# Patient Record
Sex: Male | Born: 1935 | Race: White | Hispanic: No | Marital: Married | Smoking: Current some day smoker
Health system: Southern US, Community
[De-identification: ages and names within clinical notes are randomized; demographics above are authoritative.]

## PROBLEM LIST (undated history)

## (undated) DIAGNOSIS — I4891 Unspecified atrial fibrillation: Secondary | ICD-10-CM

## (undated) DIAGNOSIS — J189 Pneumonia, unspecified organism: Secondary | ICD-10-CM

## (undated) DIAGNOSIS — K219 Gastro-esophageal reflux disease without esophagitis: Secondary | ICD-10-CM

## (undated) DIAGNOSIS — F419 Anxiety disorder, unspecified: Secondary | ICD-10-CM

## (undated) DIAGNOSIS — D62 Acute posthemorrhagic anemia: Secondary | ICD-10-CM

## (undated) DIAGNOSIS — J42 Unspecified chronic bronchitis: Secondary | ICD-10-CM

## (undated) DIAGNOSIS — R011 Cardiac murmur, unspecified: Secondary | ICD-10-CM

## (undated) DIAGNOSIS — I219 Acute myocardial infarction, unspecified: Secondary | ICD-10-CM

## (undated) DIAGNOSIS — I10 Essential (primary) hypertension: Secondary | ICD-10-CM

## (undated) DIAGNOSIS — I2510 Atherosclerotic heart disease of native coronary artery without angina pectoris: Secondary | ICD-10-CM

## (undated) DIAGNOSIS — N400 Enlarged prostate without lower urinary tract symptoms: Secondary | ICD-10-CM

## (undated) DIAGNOSIS — E7800 Pure hypercholesterolemia, unspecified: Secondary | ICD-10-CM

## (undated) DIAGNOSIS — N289 Disorder of kidney and ureter, unspecified: Secondary | ICD-10-CM

## (undated) DIAGNOSIS — K259 Gastric ulcer, unspecified as acute or chronic, without hemorrhage or perforation: Secondary | ICD-10-CM

## (undated) HISTORY — PX: HAND RECONSTRUCTION: SHX1730

## (undated) HISTORY — PX: CATARACT EXTRACTION W/ INTRAOCULAR LENS  IMPLANT, BILATERAL: SHX1307

## (undated) HISTORY — DX: Benign prostatic hyperplasia without lower urinary tract symptoms: N40.0

## (undated) HISTORY — DX: Disorder of kidney and ureter, unspecified: N28.9

## (undated) HISTORY — DX: Acute posthemorrhagic anemia: D62

## (undated) HISTORY — DX: Atherosclerotic heart disease of native coronary artery without angina pectoris: I25.10

## (undated) HISTORY — DX: Essential (primary) hypertension: I10

## (undated) HISTORY — DX: Pure hypercholesterolemia, unspecified: E78.00

## (undated) HISTORY — PX: INGUINAL HERNIA REPAIR: SUR1180

## (undated) HISTORY — DX: Gastro-esophageal reflux disease without esophagitis: K21.9

## (undated) HISTORY — DX: Gastric ulcer, unspecified as acute or chronic, without hemorrhage or perforation: K25.9

---

## 2013-08-11 DIAGNOSIS — M999 Biomechanical lesion, unspecified: Secondary | ICD-10-CM | POA: Diagnosis not present

## 2013-08-11 DIAGNOSIS — S239XXA Sprain of unspecified parts of thorax, initial encounter: Secondary | ICD-10-CM | POA: Diagnosis not present

## 2013-08-20 DIAGNOSIS — M999 Biomechanical lesion, unspecified: Secondary | ICD-10-CM | POA: Diagnosis not present

## 2013-08-20 DIAGNOSIS — S239XXA Sprain of unspecified parts of thorax, initial encounter: Secondary | ICD-10-CM | POA: Diagnosis not present

## 2013-09-07 DIAGNOSIS — K219 Gastro-esophageal reflux disease without esophagitis: Secondary | ICD-10-CM | POA: Diagnosis not present

## 2013-09-07 DIAGNOSIS — K140 Glossitis: Secondary | ICD-10-CM | POA: Diagnosis not present

## 2013-10-12 DIAGNOSIS — M999 Biomechanical lesion, unspecified: Secondary | ICD-10-CM | POA: Diagnosis not present

## 2013-10-12 DIAGNOSIS — S239XXA Sprain of unspecified parts of thorax, initial encounter: Secondary | ICD-10-CM | POA: Diagnosis not present

## 2013-10-19 DIAGNOSIS — S239XXA Sprain of unspecified parts of thorax, initial encounter: Secondary | ICD-10-CM | POA: Diagnosis not present

## 2013-10-19 DIAGNOSIS — M999 Biomechanical lesion, unspecified: Secondary | ICD-10-CM | POA: Diagnosis not present

## 2013-11-23 DIAGNOSIS — K140 Glossitis: Secondary | ICD-10-CM | POA: Diagnosis not present

## 2013-11-23 DIAGNOSIS — J0100 Acute maxillary sinusitis, unspecified: Secondary | ICD-10-CM | POA: Diagnosis not present

## 2013-11-23 DIAGNOSIS — F411 Generalized anxiety disorder: Secondary | ICD-10-CM | POA: Diagnosis not present

## 2013-11-23 DIAGNOSIS — K219 Gastro-esophageal reflux disease without esophagitis: Secondary | ICD-10-CM | POA: Diagnosis not present

## 2014-03-04 DIAGNOSIS — K140 Glossitis: Secondary | ICD-10-CM | POA: Diagnosis not present

## 2014-03-04 DIAGNOSIS — F411 Generalized anxiety disorder: Secondary | ICD-10-CM | POA: Diagnosis not present

## 2014-03-04 DIAGNOSIS — E785 Hyperlipidemia, unspecified: Secondary | ICD-10-CM | POA: Diagnosis not present

## 2014-03-04 DIAGNOSIS — I10 Essential (primary) hypertension: Secondary | ICD-10-CM | POA: Diagnosis not present

## 2014-03-04 DIAGNOSIS — K219 Gastro-esophageal reflux disease without esophagitis: Secondary | ICD-10-CM | POA: Diagnosis not present

## 2014-04-06 DIAGNOSIS — K140 Glossitis: Secondary | ICD-10-CM | POA: Diagnosis not present

## 2014-04-19 DIAGNOSIS — K219 Gastro-esophageal reflux disease without esophagitis: Secondary | ICD-10-CM | POA: Diagnosis not present

## 2014-04-19 DIAGNOSIS — G500 Trigeminal neuralgia: Secondary | ICD-10-CM | POA: Diagnosis not present

## 2014-04-19 DIAGNOSIS — J018 Other acute sinusitis: Secondary | ICD-10-CM | POA: Diagnosis not present

## 2014-07-29 DIAGNOSIS — J0100 Acute maxillary sinusitis, unspecified: Secondary | ICD-10-CM | POA: Diagnosis not present

## 2014-07-29 DIAGNOSIS — B370 Candidal stomatitis: Secondary | ICD-10-CM | POA: Diagnosis not present

## 2014-12-07 DIAGNOSIS — S90561A Insect bite (nonvenomous), right ankle, initial encounter: Secondary | ICD-10-CM | POA: Diagnosis not present

## 2014-12-07 DIAGNOSIS — L570 Actinic keratosis: Secondary | ICD-10-CM | POA: Diagnosis not present

## 2015-01-10 DIAGNOSIS — L820 Inflamed seborrheic keratosis: Secondary | ICD-10-CM | POA: Diagnosis not present

## 2015-01-10 DIAGNOSIS — L570 Actinic keratosis: Secondary | ICD-10-CM | POA: Diagnosis not present

## 2015-01-10 DIAGNOSIS — L821 Other seborrheic keratosis: Secondary | ICD-10-CM | POA: Diagnosis not present

## 2015-01-10 DIAGNOSIS — S80862A Insect bite (nonvenomous), left lower leg, initial encounter: Secondary | ICD-10-CM | POA: Diagnosis not present

## 2015-02-01 DIAGNOSIS — Z961 Presence of intraocular lens: Secondary | ICD-10-CM | POA: Diagnosis not present

## 2015-02-01 DIAGNOSIS — H04123 Dry eye syndrome of bilateral lacrimal glands: Secondary | ICD-10-CM | POA: Diagnosis not present

## 2015-02-01 DIAGNOSIS — H3530 Unspecified macular degeneration: Secondary | ICD-10-CM | POA: Diagnosis not present

## 2015-02-01 DIAGNOSIS — H26493 Other secondary cataract, bilateral: Secondary | ICD-10-CM | POA: Diagnosis not present

## 2015-02-02 DIAGNOSIS — K644 Residual hemorrhoidal skin tags: Secondary | ICD-10-CM | POA: Diagnosis not present

## 2015-02-22 DIAGNOSIS — H3531 Nonexudative age-related macular degeneration: Secondary | ICD-10-CM | POA: Diagnosis not present

## 2015-02-22 DIAGNOSIS — H26491 Other secondary cataract, right eye: Secondary | ICD-10-CM | POA: Diagnosis not present

## 2015-02-22 DIAGNOSIS — Z961 Presence of intraocular lens: Secondary | ICD-10-CM | POA: Diagnosis not present

## 2015-04-04 DIAGNOSIS — Z961 Presence of intraocular lens: Secondary | ICD-10-CM | POA: Diagnosis not present

## 2015-04-04 DIAGNOSIS — H3531 Nonexudative age-related macular degeneration: Secondary | ICD-10-CM | POA: Diagnosis not present

## 2015-04-04 DIAGNOSIS — H26492 Other secondary cataract, left eye: Secondary | ICD-10-CM | POA: Diagnosis not present

## 2015-04-12 DIAGNOSIS — L570 Actinic keratosis: Secondary | ICD-10-CM | POA: Diagnosis not present

## 2015-04-12 DIAGNOSIS — L281 Prurigo nodularis: Secondary | ICD-10-CM | POA: Diagnosis not present

## 2015-04-23 DIAGNOSIS — I4891 Unspecified atrial fibrillation: Secondary | ICD-10-CM

## 2015-04-23 DIAGNOSIS — I219 Acute myocardial infarction, unspecified: Secondary | ICD-10-CM

## 2015-04-23 HISTORY — DX: Acute myocardial infarction, unspecified: I21.9

## 2015-04-23 HISTORY — PX: CORONARY ARTERY BYPASS GRAFT: SHX141

## 2015-04-23 HISTORY — DX: Unspecified atrial fibrillation: I48.91

## 2015-04-26 DIAGNOSIS — H26492 Other secondary cataract, left eye: Secondary | ICD-10-CM | POA: Diagnosis not present

## 2015-05-04 DIAGNOSIS — Z951 Presence of aortocoronary bypass graft: Secondary | ICD-10-CM | POA: Diagnosis not present

## 2015-05-04 DIAGNOSIS — T508X5A Adverse effect of diagnostic agents, initial encounter: Secondary | ICD-10-CM | POA: Diagnosis not present

## 2015-05-04 DIAGNOSIS — I2109 ST elevation (STEMI) myocardial infarction involving other coronary artery of anterior wall: Secondary | ICD-10-CM | POA: Diagnosis not present

## 2015-05-04 DIAGNOSIS — J9811 Atelectasis: Secondary | ICD-10-CM | POA: Diagnosis not present

## 2015-05-04 DIAGNOSIS — I25110 Atherosclerotic heart disease of native coronary artery with unstable angina pectoris: Secondary | ICD-10-CM | POA: Diagnosis not present

## 2015-05-04 DIAGNOSIS — I2101 ST elevation (STEMI) myocardial infarction involving left main coronary artery: Secondary | ICD-10-CM | POA: Diagnosis not present

## 2015-05-04 DIAGNOSIS — E876 Hypokalemia: Secondary | ICD-10-CM | POA: Diagnosis not present

## 2015-05-04 DIAGNOSIS — I4891 Unspecified atrial fibrillation: Secondary | ICD-10-CM | POA: Diagnosis not present

## 2015-05-04 DIAGNOSIS — F17210 Nicotine dependence, cigarettes, uncomplicated: Secondary | ICD-10-CM | POA: Diagnosis not present

## 2015-05-04 DIAGNOSIS — R279 Unspecified lack of coordination: Secondary | ICD-10-CM | POA: Diagnosis not present

## 2015-05-04 DIAGNOSIS — G4700 Insomnia, unspecified: Secondary | ICD-10-CM | POA: Diagnosis not present

## 2015-05-04 DIAGNOSIS — Z452 Encounter for adjustment and management of vascular access device: Secondary | ICD-10-CM | POA: Diagnosis not present

## 2015-05-04 DIAGNOSIS — I213 ST elevation (STEMI) myocardial infarction of unspecified site: Secondary | ICD-10-CM | POA: Diagnosis not present

## 2015-05-04 DIAGNOSIS — R0602 Shortness of breath: Secondary | ICD-10-CM | POA: Diagnosis not present

## 2015-05-04 DIAGNOSIS — R570 Cardiogenic shock: Secondary | ICD-10-CM | POA: Diagnosis not present

## 2015-05-04 DIAGNOSIS — R531 Weakness: Secondary | ICD-10-CM | POA: Diagnosis not present

## 2015-05-04 DIAGNOSIS — Q245 Malformation of coronary vessels: Secondary | ICD-10-CM | POA: Diagnosis not present

## 2015-05-04 DIAGNOSIS — I2510 Atherosclerotic heart disease of native coronary artery without angina pectoris: Secondary | ICD-10-CM | POA: Diagnosis not present

## 2015-05-04 DIAGNOSIS — E785 Hyperlipidemia, unspecified: Secondary | ICD-10-CM | POA: Diagnosis present

## 2015-05-04 DIAGNOSIS — Z48812 Encounter for surgical aftercare following surgery on the circulatory system: Secondary | ICD-10-CM | POA: Diagnosis not present

## 2015-05-04 DIAGNOSIS — Z4889 Encounter for other specified surgical aftercare: Secondary | ICD-10-CM | POA: Diagnosis not present

## 2015-05-04 DIAGNOSIS — N179 Acute kidney failure, unspecified: Secondary | ICD-10-CM | POA: Diagnosis not present

## 2015-05-04 DIAGNOSIS — J441 Chronic obstructive pulmonary disease with (acute) exacerbation: Secondary | ICD-10-CM | POA: Diagnosis present

## 2015-05-04 DIAGNOSIS — I10 Essential (primary) hypertension: Secondary | ICD-10-CM | POA: Diagnosis not present

## 2015-05-04 DIAGNOSIS — R918 Other nonspecific abnormal finding of lung field: Secondary | ICD-10-CM | POA: Diagnosis not present

## 2015-05-04 DIAGNOSIS — I9789 Other postprocedural complications and disorders of the circulatory system, not elsewhere classified: Secondary | ICD-10-CM | POA: Diagnosis not present

## 2015-05-04 DIAGNOSIS — E872 Acidosis: Secondary | ICD-10-CM | POA: Diagnosis not present

## 2015-05-04 DIAGNOSIS — M6281 Muscle weakness (generalized): Secondary | ICD-10-CM | POA: Diagnosis not present

## 2015-05-04 DIAGNOSIS — J810 Acute pulmonary edema: Secondary | ICD-10-CM | POA: Diagnosis not present

## 2015-05-04 DIAGNOSIS — D62 Acute posthemorrhagic anemia: Secondary | ICD-10-CM | POA: Diagnosis not present

## 2015-05-04 DIAGNOSIS — E8770 Fluid overload, unspecified: Secondary | ICD-10-CM | POA: Diagnosis not present

## 2015-05-04 DIAGNOSIS — E7800 Pure hypercholesterolemia, unspecified: Secondary | ICD-10-CM | POA: Diagnosis not present

## 2015-05-04 DIAGNOSIS — K5900 Constipation, unspecified: Secondary | ICD-10-CM | POA: Diagnosis not present

## 2015-05-04 DIAGNOSIS — J9601 Acute respiratory failure with hypoxia: Secondary | ICD-10-CM | POA: Diagnosis not present

## 2015-05-04 DIAGNOSIS — F419 Anxiety disorder, unspecified: Secondary | ICD-10-CM | POA: Diagnosis not present

## 2015-05-04 DIAGNOSIS — E119 Type 2 diabetes mellitus without complications: Secondary | ICD-10-CM | POA: Diagnosis not present

## 2015-05-04 DIAGNOSIS — E871 Hypo-osmolality and hyponatremia: Secondary | ICD-10-CM | POA: Diagnosis not present

## 2015-05-04 DIAGNOSIS — K219 Gastro-esophageal reflux disease without esophagitis: Secondary | ICD-10-CM | POA: Diagnosis present

## 2015-05-04 DIAGNOSIS — D696 Thrombocytopenia, unspecified: Secondary | ICD-10-CM | POA: Diagnosis not present

## 2015-05-04 DIAGNOSIS — R262 Difficulty in walking, not elsewhere classified: Secondary | ICD-10-CM | POA: Diagnosis not present

## 2015-05-04 DIAGNOSIS — Y840 Cardiac catheterization as the cause of abnormal reaction of the patient, or of later complication, without mention of misadventure at the time of the procedure: Secondary | ICD-10-CM | POA: Diagnosis not present

## 2015-05-04 DIAGNOSIS — N170 Acute kidney failure with tubular necrosis: Secondary | ICD-10-CM | POA: Diagnosis not present

## 2015-05-04 DIAGNOSIS — J90 Pleural effusion, not elsewhere classified: Secondary | ICD-10-CM | POA: Diagnosis not present

## 2015-05-04 DIAGNOSIS — J209 Acute bronchitis, unspecified: Secondary | ICD-10-CM | POA: Diagnosis not present

## 2015-05-04 DIAGNOSIS — J984 Other disorders of lung: Secondary | ICD-10-CM | POA: Diagnosis not present

## 2015-05-04 DIAGNOSIS — Z743 Need for continuous supervision: Secondary | ICD-10-CM | POA: Diagnosis not present

## 2015-05-04 DIAGNOSIS — J158 Pneumonia due to other specified bacteria: Secondary | ICD-10-CM | POA: Diagnosis not present

## 2015-05-14 DIAGNOSIS — E119 Type 2 diabetes mellitus without complications: Secondary | ICD-10-CM | POA: Diagnosis not present

## 2015-05-14 DIAGNOSIS — R279 Unspecified lack of coordination: Secondary | ICD-10-CM | POA: Diagnosis not present

## 2015-05-14 DIAGNOSIS — Z09 Encounter for follow-up examination after completed treatment for conditions other than malignant neoplasm: Secondary | ICD-10-CM | POA: Diagnosis not present

## 2015-05-14 DIAGNOSIS — J90 Pleural effusion, not elsewhere classified: Secondary | ICD-10-CM | POA: Diagnosis not present

## 2015-05-14 DIAGNOSIS — N179 Acute kidney failure, unspecified: Secondary | ICD-10-CM | POA: Diagnosis not present

## 2015-05-14 DIAGNOSIS — E785 Hyperlipidemia, unspecified: Secondary | ICD-10-CM | POA: Diagnosis not present

## 2015-05-14 DIAGNOSIS — E7800 Pure hypercholesterolemia, unspecified: Secondary | ICD-10-CM | POA: Diagnosis not present

## 2015-05-14 DIAGNOSIS — J441 Chronic obstructive pulmonary disease with (acute) exacerbation: Secondary | ICD-10-CM | POA: Diagnosis not present

## 2015-05-14 DIAGNOSIS — K5900 Constipation, unspecified: Secondary | ICD-10-CM | POA: Diagnosis not present

## 2015-05-14 DIAGNOSIS — I2101 ST elevation (STEMI) myocardial infarction involving left main coronary artery: Secondary | ICD-10-CM | POA: Diagnosis not present

## 2015-05-14 DIAGNOSIS — R05 Cough: Secondary | ICD-10-CM | POA: Diagnosis not present

## 2015-05-14 DIAGNOSIS — I4891 Unspecified atrial fibrillation: Secondary | ICD-10-CM | POA: Diagnosis not present

## 2015-05-14 DIAGNOSIS — Z743 Need for continuous supervision: Secondary | ICD-10-CM | POA: Diagnosis not present

## 2015-05-14 DIAGNOSIS — M6281 Muscle weakness (generalized): Secondary | ICD-10-CM | POA: Diagnosis not present

## 2015-05-14 DIAGNOSIS — R0602 Shortness of breath: Secondary | ICD-10-CM | POA: Diagnosis not present

## 2015-05-14 DIAGNOSIS — I2510 Atherosclerotic heart disease of native coronary artery without angina pectoris: Secondary | ICD-10-CM | POA: Diagnosis not present

## 2015-05-14 DIAGNOSIS — E876 Hypokalemia: Secondary | ICD-10-CM | POA: Diagnosis not present

## 2015-05-14 DIAGNOSIS — J158 Pneumonia due to other specified bacteria: Secondary | ICD-10-CM | POA: Diagnosis not present

## 2015-05-14 DIAGNOSIS — I10 Essential (primary) hypertension: Secondary | ICD-10-CM | POA: Diagnosis not present

## 2015-05-14 DIAGNOSIS — J209 Acute bronchitis, unspecified: Secondary | ICD-10-CM | POA: Diagnosis not present

## 2015-05-14 DIAGNOSIS — F419 Anxiety disorder, unspecified: Secondary | ICD-10-CM | POA: Diagnosis not present

## 2015-05-14 DIAGNOSIS — R262 Difficulty in walking, not elsewhere classified: Secondary | ICD-10-CM | POA: Diagnosis not present

## 2015-05-14 DIAGNOSIS — I213 ST elevation (STEMI) myocardial infarction of unspecified site: Secondary | ICD-10-CM | POA: Diagnosis not present

## 2015-05-14 DIAGNOSIS — G4700 Insomnia, unspecified: Secondary | ICD-10-CM | POA: Diagnosis not present

## 2015-05-14 DIAGNOSIS — Z951 Presence of aortocoronary bypass graft: Secondary | ICD-10-CM | POA: Diagnosis not present

## 2015-05-14 DIAGNOSIS — Y840 Cardiac catheterization as the cause of abnormal reaction of the patient, or of later complication, without mention of misadventure at the time of the procedure: Secondary | ICD-10-CM | POA: Diagnosis not present

## 2015-05-16 DIAGNOSIS — I2510 Atherosclerotic heart disease of native coronary artery without angina pectoris: Secondary | ICD-10-CM | POA: Diagnosis not present

## 2015-05-16 DIAGNOSIS — J158 Pneumonia due to other specified bacteria: Secondary | ICD-10-CM | POA: Diagnosis not present

## 2015-05-16 DIAGNOSIS — J441 Chronic obstructive pulmonary disease with (acute) exacerbation: Secondary | ICD-10-CM | POA: Diagnosis not present

## 2015-05-16 DIAGNOSIS — I10 Essential (primary) hypertension: Secondary | ICD-10-CM | POA: Diagnosis not present

## 2015-05-16 DIAGNOSIS — I213 ST elevation (STEMI) myocardial infarction of unspecified site: Secondary | ICD-10-CM | POA: Diagnosis not present

## 2015-05-19 DIAGNOSIS — K5900 Constipation, unspecified: Secondary | ICD-10-CM | POA: Diagnosis not present

## 2015-05-19 DIAGNOSIS — E7800 Pure hypercholesterolemia, unspecified: Secondary | ICD-10-CM | POA: Diagnosis not present

## 2015-05-19 DIAGNOSIS — I2510 Atherosclerotic heart disease of native coronary artery without angina pectoris: Secondary | ICD-10-CM | POA: Diagnosis not present

## 2015-05-19 DIAGNOSIS — I10 Essential (primary) hypertension: Secondary | ICD-10-CM | POA: Diagnosis not present

## 2015-05-20 DIAGNOSIS — I213 ST elevation (STEMI) myocardial infarction of unspecified site: Secondary | ICD-10-CM | POA: Diagnosis not present

## 2015-05-20 DIAGNOSIS — J158 Pneumonia due to other specified bacteria: Secondary | ICD-10-CM | POA: Diagnosis not present

## 2015-05-20 DIAGNOSIS — J441 Chronic obstructive pulmonary disease with (acute) exacerbation: Secondary | ICD-10-CM | POA: Diagnosis not present

## 2015-05-20 DIAGNOSIS — I2510 Atherosclerotic heart disease of native coronary artery without angina pectoris: Secondary | ICD-10-CM | POA: Diagnosis not present

## 2015-05-20 DIAGNOSIS — I10 Essential (primary) hypertension: Secondary | ICD-10-CM | POA: Diagnosis not present

## 2015-05-23 DIAGNOSIS — I10 Essential (primary) hypertension: Secondary | ICD-10-CM | POA: Diagnosis not present

## 2015-05-23 DIAGNOSIS — I213 ST elevation (STEMI) myocardial infarction of unspecified site: Secondary | ICD-10-CM | POA: Diagnosis not present

## 2015-05-23 DIAGNOSIS — I2510 Atherosclerotic heart disease of native coronary artery without angina pectoris: Secondary | ICD-10-CM | POA: Diagnosis not present

## 2015-05-23 DIAGNOSIS — J158 Pneumonia due to other specified bacteria: Secondary | ICD-10-CM | POA: Diagnosis not present

## 2015-05-23 DIAGNOSIS — J441 Chronic obstructive pulmonary disease with (acute) exacerbation: Secondary | ICD-10-CM | POA: Diagnosis not present

## 2015-05-24 DIAGNOSIS — J189 Pneumonia, unspecified organism: Secondary | ICD-10-CM

## 2015-05-24 HISTORY — DX: Pneumonia, unspecified organism: J18.9

## 2015-05-25 DIAGNOSIS — I2510 Atherosclerotic heart disease of native coronary artery without angina pectoris: Secondary | ICD-10-CM | POA: Diagnosis not present

## 2015-05-25 DIAGNOSIS — J158 Pneumonia due to other specified bacteria: Secondary | ICD-10-CM | POA: Diagnosis not present

## 2015-05-25 DIAGNOSIS — I10 Essential (primary) hypertension: Secondary | ICD-10-CM | POA: Diagnosis not present

## 2015-05-25 DIAGNOSIS — I213 ST elevation (STEMI) myocardial infarction of unspecified site: Secondary | ICD-10-CM | POA: Diagnosis not present

## 2015-05-25 DIAGNOSIS — J441 Chronic obstructive pulmonary disease with (acute) exacerbation: Secondary | ICD-10-CM | POA: Diagnosis not present

## 2015-05-26 DIAGNOSIS — I10 Essential (primary) hypertension: Secondary | ICD-10-CM | POA: Diagnosis not present

## 2015-05-26 DIAGNOSIS — J441 Chronic obstructive pulmonary disease with (acute) exacerbation: Secondary | ICD-10-CM | POA: Diagnosis not present

## 2015-05-26 DIAGNOSIS — I213 ST elevation (STEMI) myocardial infarction of unspecified site: Secondary | ICD-10-CM | POA: Diagnosis not present

## 2015-05-26 DIAGNOSIS — I2510 Atherosclerotic heart disease of native coronary artery without angina pectoris: Secondary | ICD-10-CM | POA: Diagnosis not present

## 2015-05-26 DIAGNOSIS — J158 Pneumonia due to other specified bacteria: Secondary | ICD-10-CM | POA: Diagnosis not present

## 2015-05-30 DIAGNOSIS — I10 Essential (primary) hypertension: Secondary | ICD-10-CM | POA: Diagnosis not present

## 2015-05-30 DIAGNOSIS — J158 Pneumonia due to other specified bacteria: Secondary | ICD-10-CM | POA: Diagnosis not present

## 2015-05-30 DIAGNOSIS — J441 Chronic obstructive pulmonary disease with (acute) exacerbation: Secondary | ICD-10-CM | POA: Diagnosis not present

## 2015-05-30 DIAGNOSIS — I213 ST elevation (STEMI) myocardial infarction of unspecified site: Secondary | ICD-10-CM | POA: Diagnosis not present

## 2015-05-30 DIAGNOSIS — I2510 Atherosclerotic heart disease of native coronary artery without angina pectoris: Secondary | ICD-10-CM | POA: Diagnosis not present

## 2015-06-02 DIAGNOSIS — I10 Essential (primary) hypertension: Secondary | ICD-10-CM | POA: Diagnosis not present

## 2015-06-02 DIAGNOSIS — I213 ST elevation (STEMI) myocardial infarction of unspecified site: Secondary | ICD-10-CM | POA: Diagnosis not present

## 2015-06-02 DIAGNOSIS — J441 Chronic obstructive pulmonary disease with (acute) exacerbation: Secondary | ICD-10-CM | POA: Diagnosis not present

## 2015-06-02 DIAGNOSIS — I2510 Atherosclerotic heart disease of native coronary artery without angina pectoris: Secondary | ICD-10-CM | POA: Diagnosis not present

## 2015-06-02 DIAGNOSIS — J158 Pneumonia due to other specified bacteria: Secondary | ICD-10-CM | POA: Diagnosis not present

## 2015-06-08 DIAGNOSIS — I482 Chronic atrial fibrillation: Secondary | ICD-10-CM | POA: Diagnosis not present

## 2015-06-08 DIAGNOSIS — R251 Tremor, unspecified: Secondary | ICD-10-CM | POA: Diagnosis not present

## 2015-06-08 DIAGNOSIS — I10 Essential (primary) hypertension: Secondary | ICD-10-CM | POA: Diagnosis not present

## 2015-06-08 DIAGNOSIS — I252 Old myocardial infarction: Secondary | ICD-10-CM | POA: Diagnosis not present

## 2015-06-08 DIAGNOSIS — Z951 Presence of aortocoronary bypass graft: Secondary | ICD-10-CM | POA: Diagnosis not present

## 2015-06-08 DIAGNOSIS — M6281 Muscle weakness (generalized): Secondary | ICD-10-CM | POA: Diagnosis not present

## 2015-06-08 DIAGNOSIS — Z8701 Personal history of pneumonia (recurrent): Secondary | ICD-10-CM | POA: Diagnosis not present

## 2015-06-08 DIAGNOSIS — E785 Hyperlipidemia, unspecified: Secondary | ICD-10-CM | POA: Diagnosis not present

## 2015-07-06 DIAGNOSIS — T814XXA Infection following a procedure, initial encounter: Secondary | ICD-10-CM | POA: Diagnosis not present

## 2015-07-06 DIAGNOSIS — I80292 Phlebitis and thrombophlebitis of other deep vessels of left lower extremity: Secondary | ICD-10-CM | POA: Diagnosis not present

## 2015-07-06 DIAGNOSIS — D649 Anemia, unspecified: Secondary | ICD-10-CM | POA: Diagnosis not present

## 2015-07-06 DIAGNOSIS — E875 Hyperkalemia: Secondary | ICD-10-CM | POA: Diagnosis not present

## 2015-08-02 DIAGNOSIS — Z79899 Other long term (current) drug therapy: Secondary | ICD-10-CM | POA: Diagnosis not present

## 2015-08-02 DIAGNOSIS — D649 Anemia, unspecified: Secondary | ICD-10-CM | POA: Diagnosis not present

## 2015-08-02 DIAGNOSIS — E876 Hypokalemia: Secondary | ICD-10-CM | POA: Diagnosis not present

## 2015-08-02 DIAGNOSIS — L309 Dermatitis, unspecified: Secondary | ICD-10-CM | POA: Diagnosis not present

## 2015-08-10 DIAGNOSIS — J209 Acute bronchitis, unspecified: Secondary | ICD-10-CM | POA: Diagnosis not present

## 2015-08-17 DIAGNOSIS — I2510 Atherosclerotic heart disease of native coronary artery without angina pectoris: Secondary | ICD-10-CM | POA: Diagnosis not present

## 2015-08-17 DIAGNOSIS — I5022 Chronic systolic (congestive) heart failure: Secondary | ICD-10-CM | POA: Diagnosis not present

## 2015-08-17 DIAGNOSIS — Q245 Malformation of coronary vessels: Secondary | ICD-10-CM | POA: Diagnosis not present

## 2015-08-17 DIAGNOSIS — Z951 Presence of aortocoronary bypass graft: Secondary | ICD-10-CM | POA: Diagnosis not present

## 2015-10-19 ENCOUNTER — Ambulatory Visit (INDEPENDENT_AMBULATORY_CARE_PROVIDER_SITE_OTHER): Payer: Medicare Other | Admitting: Cardiology

## 2015-10-19 ENCOUNTER — Encounter: Payer: Self-pay | Admitting: Cardiology

## 2015-10-19 VITALS — BP 113/62 | HR 74 | Ht 69.0 in | Wt 145.0 lb

## 2015-10-19 DIAGNOSIS — E785 Hyperlipidemia, unspecified: Secondary | ICD-10-CM

## 2015-10-19 DIAGNOSIS — I9789 Other postprocedural complications and disorders of the circulatory system, not elsewhere classified: Secondary | ICD-10-CM

## 2015-10-19 DIAGNOSIS — Z720 Tobacco use: Secondary | ICD-10-CM

## 2015-10-19 DIAGNOSIS — R011 Cardiac murmur, unspecified: Secondary | ICD-10-CM

## 2015-10-19 DIAGNOSIS — I10 Essential (primary) hypertension: Secondary | ICD-10-CM | POA: Diagnosis not present

## 2015-10-19 DIAGNOSIS — I739 Peripheral vascular disease, unspecified: Secondary | ICD-10-CM

## 2015-10-19 DIAGNOSIS — I2510 Atherosclerotic heart disease of native coronary artery without angina pectoris: Secondary | ICD-10-CM | POA: Diagnosis not present

## 2015-10-19 DIAGNOSIS — I4891 Unspecified atrial fibrillation: Secondary | ICD-10-CM

## 2015-10-19 DIAGNOSIS — I2583 Coronary atherosclerosis due to lipid rich plaque: Secondary | ICD-10-CM

## 2015-10-19 MED ORDER — ATORVASTATIN CALCIUM 80 MG PO TABS: 80.0000 mg | ORAL_TABLET | Freq: Every day | ORAL | Status: DC

## 2015-10-19 NOTE — Assessment & Plan Note (Signed)
Given documented coronary artery disease we will increase Lipitor to 80 mg daily. Check lipids and liver in 4 weeks.

## 2015-10-19 NOTE — Progress Notes (Signed)
HPI: 82 year old male for evaluation of coronary artery disease. No prior cardiac history until [place] 2016[place] He had a myocardial infarction and was taking care of at [facility]. He was taken emergently to the cardiac catheterization lab and then had coronary artery bypass graft that evening. I do not have records available. He has done reasonably well since discharge. He apparently had postoperative atrial fibrillation for approximately one month. He has occasional dyspnea but can do most of his daily activities. No orthopnea, PND, pedal edema, chest pain or syncope.  Current Outpatient Prescriptions  Medication Sig Dispense Refill  . apixaban (ELIQUIS) 5 MG TABS tablet Take 5 mg by mouth 2 (two) times daily.    Marland Kitchen aspirin 81 MG tablet Take 81 mg by mouth daily.    Marland Kitchen atorvastatin (LIPITOR) 40 MG tablet Take 40 mg by mouth daily.    . Cetirizine HCl 10 MG CAPS Take 10 mg by mouth daily.    Marland Kitchen doxazosin (CARDURA) 4 MG tablet Take 4 mg by mouth daily.    . ferrous sulfate 325 (65 FE) MG EC tablet Take 325 mg by mouth 3 (three) times daily with meals.    . furosemide (LASIX) 40 MG tablet Take 40 mg by mouth.    Marland Kitchen lisinopril (PRINIVIL,ZESTRIL) 10 MG tablet Take 10 mg by mouth daily.    Marland Kitchen LORazepam (ATIVAN) 0.5 MG tablet Take 0.5 mg by mouth every 8 (eight) hours.    . metoprolol succinate (TOPROL-XL) 50 MG 24 hr tablet Take 50 mg by mouth daily. Take with or immediately following a meal.    . Multiple Vitamin (MULTIVITAMIN) tablet Take 1 tablet by mouth daily.    Marland Kitchen omeprazole (PRILOSEC) 20 MG capsule Take 20 mg by mouth daily.    . potassium chloride (KLOR-CON) 20 MEQ packet Take by mouth 2 (two) times daily.    . tamsulosin (FLOMAX) 0.4 MG CAPS capsule Take 0.4 mg by mouth.     No current facility-administered medications for this visit.    No Known Allergies   Past Medical History  Diagnosis Date  . High cholesterol   . Hypertension   . Renal  insufficiency   . BPH (benign prostatic hyperplasia)   . GERD (gastroesophageal reflux disease)   . CAD (coronary artery disease)     Past Surgical History  Procedure Laterality Date  . Hernia repair    . Cataract extraction, bilateral    . Coronary artery bypass graft      Social History   Social History  . Marital Status: Married    Spouse Name: N/A  . Number of Children: 3  . Years of Education: N/A   Occupational History  . Not on file.   Social History Main Topics  . Smoking status: Current Every Day Smoker  . Smokeless tobacco: Not on file  . Alcohol Use: 0.0 oz/week    0 Standard drinks or equivalent per week     Comment: Occasional  . Drug Use: No  . Sexual Activity: Not on file   Other Topics Concern  . Not on file   Social History Narrative  . No narrative on file    Family History  Problem Relation Age of Onset  . CAD Father     ROS: no fevers or chills, productive cough, hemoptysis, dysphasia, odynophagia, melena, hematochezia, dysuria, hematuria, rash, seizure activity, orthopnea, PND, pedal edema, claudication. Remaining systems are negative.  Physical  Exam:   Blood pressure 113/62, pulse 74, height 5\' 9"  (1.753 m), weight 145 lb (65.772 kg).  General:  Well developed/well nourished in NAD Skin warm/dry Patient not depressed No peripheral clubbing Back-normal HEENT-normal/normal eyelids Neck supple/normal carotid upstroke bilaterally; no bruits; no JVD; no thyromegaly chest - CTA/ normal expansion; Sternotomy without evidence of infection. CV - RRR/normal S1 and S2; no rubs or gallops;  PMI nondisplaced; 2/6 systolic murmur left sternal border. Abdomen -NT/ND, no HSM, no mass, + bowel sounds, no bruit 2+ femoral pulses, bilateral bruits Ext-no edema, chords, 2+ DP Neuro-grossly nonfocal  ECG Sinus rhythm at a rate of 72. Normal axis. Nonspecific ST changes.

## 2015-10-19 NOTE — Assessment & Plan Note (Signed)
Patient in sinus rhythm today. He sounds to have had postoperative atrial fibrillation up to 1 month after procedure. We will review previous records. Continue apixaban and metoprolol. Check hemoglobin and renal function. If he was having atrial fibrillation greater than 1 month after procedure would favor continuing anticoagulation long-term. We will make decision at time of next office visit.

## 2015-10-19 NOTE — Assessment & Plan Note (Signed)
Patient had a myocardial infarction in October and required emergent cardiac catheterization and then bypass surgery. Obtain all previous records. Continue aspirin, statin, ACE inhibitor and beta blocker. Plan echocardiogram to assess LV function.

## 2015-10-19 NOTE — Assessment & Plan Note (Signed)
Blood pressure controlled. Continue present medications. Check potassium and renal function. 

## 2015-10-19 NOTE — Assessment & Plan Note (Signed)
Patient sounds to have aortic stenosis on examination. Schedule echocardiogram.

## 2015-10-19 NOTE — Addendum Note (Signed)
Addended by: Cristopher Estimable on: 10/19/2015 04:10 PM   Modules accepted: Orders

## 2015-10-19 NOTE — Assessment & Plan Note (Signed)
Patient counseled on discontinuing. 

## 2015-10-19 NOTE — Patient Instructions (Signed)
Medication Instructions:   INCREASE ATORVASTATIN TO 80 MG ONCE DAILY= 2 OF THE 40 MG TABLETS ONCE DAILY  Labwork:  Your physician recommends that you return for lab work in: 4 WEEKS= DO NOT EAT PRIOR TO LAB WORK  Testing/Procedures:  Your physician has requested that you have an echocardiogram. Echocardiography is a painless test that uses sound waves to create images of your heart. It provides your doctor with information about the size and shape of your heart and how well your heart's chambers and valves are working. This procedure takes approximately one hour. There are no restrictions for this procedure.    Follow-Up:  Your physician recommends that you schedule a follow-up appointment in: [place]

## 2015-10-19 NOTE — Assessment & Plan Note (Signed)
Patient with bilateral femoral bruits but no claudication.Continue aspirin and statin. We will review outside records and make sure that he has had an abdominal ultrasound to exclude aneurysm.

## 2015-10-20 ENCOUNTER — Telehealth: Payer: Self-pay | Admitting: Cardiology

## 2015-10-20 NOTE — Telephone Encounter (Signed)
Faxed Release signed by patient to [facility]to obtain records per Dr Stanford Breed request.  Faxed on 10/20/15. lp

## 2015-10-27 ENCOUNTER — Encounter: Payer: Self-pay | Admitting: Family Medicine

## 2015-10-27 ENCOUNTER — Telehealth: Payer: Self-pay | Admitting: Cardiology

## 2015-10-27 ENCOUNTER — Ambulatory Visit (INDEPENDENT_AMBULATORY_CARE_PROVIDER_SITE_OTHER): Payer: Medicare Other | Admitting: Family Medicine

## 2015-10-27 VITALS — BP 131/70 | HR 70 | Wt 144.0 lb

## 2015-10-27 DIAGNOSIS — N410 Acute prostatitis: Secondary | ICD-10-CM

## 2015-10-27 DIAGNOSIS — I739 Peripheral vascular disease, unspecified: Secondary | ICD-10-CM

## 2015-10-27 DIAGNOSIS — I10 Essential (primary) hypertension: Secondary | ICD-10-CM | POA: Diagnosis not present

## 2015-10-27 DIAGNOSIS — I4891 Unspecified atrial fibrillation: Secondary | ICD-10-CM

## 2015-10-27 DIAGNOSIS — I9789 Other postprocedural complications and disorders of the circulatory system, not elsewhere classified: Secondary | ICD-10-CM

## 2015-10-27 DIAGNOSIS — I2510 Atherosclerotic heart disease of native coronary artery without angina pectoris: Secondary | ICD-10-CM

## 2015-10-27 DIAGNOSIS — Z720 Tobacco use: Secondary | ICD-10-CM

## 2015-10-27 DIAGNOSIS — M899 Disorder of bone, unspecified: Secondary | ICD-10-CM

## 2015-10-27 DIAGNOSIS — E785 Hyperlipidemia, unspecified: Secondary | ICD-10-CM

## 2015-10-27 DIAGNOSIS — R011 Cardiac murmur, unspecified: Secondary | ICD-10-CM

## 2015-10-27 DIAGNOSIS — Z136 Encounter for screening for cardiovascular disorders: Secondary | ICD-10-CM

## 2015-10-27 DIAGNOSIS — R739 Hyperglycemia, unspecified: Secondary | ICD-10-CM

## 2015-10-27 DIAGNOSIS — M949 Disorder of cartilage, unspecified: Secondary | ICD-10-CM

## 2015-10-27 DIAGNOSIS — I2583 Coronary atherosclerosis due to lipid rich plaque: Secondary | ICD-10-CM

## 2015-10-27 LAB — POCT URINALYSIS DIPSTICK
Bilirubin, UA: NEGATIVE
Glucose, UA: NEGATIVE
Ketones, UA: NEGATIVE
Leukocytes, UA: NEGATIVE
NITRITE UA: NEGATIVE
PROTEIN UA: NEGATIVE
RBC UA: NEGATIVE
SPEC GRAV UA: 1.01
UROBILINOGEN UA: 0.2
pH, UA: 6.5

## 2015-10-27 MED ORDER — CIPROFLOXACIN HCL 500 MG PO TABS: 500.0000 mg | ORAL_TABLET | Freq: Two times a day (BID) | ORAL | Status: DC

## 2015-10-27 MED ORDER — LORAZEPAM 0.5 MG PO TABS: 0.5000 mg | ORAL_TABLET | Freq: Three times a day (TID) | ORAL | Status: DC | PRN

## 2015-10-27 MED ORDER — TAMSULOSIN HCL 0.4 MG PO CAPS: 0.4000 mg | ORAL_CAPSULE | Freq: Every day | ORAL | Status: DC

## 2015-10-27 MED ORDER — VARENICLINE TARTRATE 0.5 MG X 11 & 1 MG X 42 PO MISC: ORAL | Status: DC

## 2015-10-27 MED ORDER — DOXAZOSIN MESYLATE 4 MG PO TABS: 4.0000 mg | ORAL_TABLET | Freq: Every day | ORAL | Status: DC

## 2015-10-27 NOTE — Progress Notes (Signed)
Bradley Stanley is a 82 y.o. male who presents to [facility]: Primary Care today for establish care and discuss BPH, or prostatitis.  Patient recently moved back to the [place] area. He previously was living in [facility]. He had a major cardiovascular event and October were he had a heart attack and lost consciousness. His wife describes paramedics doing CPR. He had emergency recatheterization followed quickly by CABG surgery. He has recently completed cardio rehabilitation and feels great. He is nearly back to his normal level of activity. Recently was seen by cardiology For an initial patient evaluation and seems to be doing pretty well. An echocardiogram has been ordered as well.  Additionally patient notes history of BPH. He's recently been out of his Flomax and notes urinary frequency urgency and dysuria. Symptoms are consistent with previous episodes of prostatitis mildly. He denies any fevers or chills.   Anxiety: Patient is a history of anxiety. He's previously taken lorazepam intermittently. He notes he takes a half a tab or tab every once in a while. He and his wife both deny any history of confusion or falls related to this medication.   Smoking: Patient smokes daily. He was wife are both interested and motivated to quit smoking for his health.  Advance directive: Patient notes that he does have an advanced directive. He does wish an initial trial of resuscitation but does not wish prolonged tube feeding or extensive stays in the ICU with little hope of recovery.   Past Medical History  Diagnosis Date  . High cholesterol   . Hypertension   . Renal insufficiency   . BPH (benign prostatic hyperplasia)   . GERD (gastroesophageal reflux disease)   . CAD (coronary artery disease)    Past Surgical History  Procedure Laterality Date  . Hernia repair    . Cataract extraction,  bilateral    . Coronary artery bypass graft     Social History  Substance Use Topics  . Smoking status: Current Every Day Smoker  . Smokeless tobacco: Not on file  . Alcohol Use: 0.0 oz/week    0 Standard drinks or equivalent per week     Comment: Occasional   family history includes CAD in his father; Diabetes in his brother and sister.  ROS as above: No headache, visual changes, nausea, vomiting, diarrhea, constipation, dizziness, abdominal pain, skin rash, fevers, chills, night sweats, weight loss, swollen lymph nodes, body aches, joint swelling, muscle aches, chest pain, shortness of breath, mood changes, visual or auditory hallucinations.    Medications: Current Outpatient Prescriptions  Medication Sig Dispense Refill  . apixaban (ELIQUIS) 5 MG TABS tablet Take 5 mg by mouth 2 (two) times daily.    Marland Kitchen aspirin 81 MG tablet Take 81 mg by mouth daily.    Marland Kitchen atorvastatin (LIPITOR) 80 MG tablet Take 1 tablet (80 mg total) by mouth daily. 90 tablet 3  . Cetirizine HCl 10 MG CAPS Take 10 mg by mouth daily.    Marland Kitchen doxazosin (CARDURA) 4 MG tablet Take 1 tablet (4 mg total) by mouth daily. 90 tablet 2  . ferrous sulfate 325 (65 FE) MG EC tablet Take 325 mg by mouth 3 (three) times daily with meals.    . furosemide (LASIX) 40 MG tablet Take 40 mg by mouth.    Marland Kitchen lisinopril (PRINIVIL,ZESTRIL) 10 MG tablet Take 10 mg by mouth daily.    Marland Kitchen LORazepam (ATIVAN) 0.5 MG tablet Take 1 tablet (0.5 mg  total) by mouth every 8 (eight) hours as needed for anxiety. 30 tablet 0  . metoprolol succinate (TOPROL-XL) 50 MG 24 hr tablet Take 50 mg by mouth daily. Take with or immediately following a meal.    . Multiple Vitamin (MULTIVITAMIN) tablet Take 1 tablet by mouth daily.    Marland Kitchen omeprazole (PRILOSEC) 20 MG capsule Take 20 mg by mouth daily.    . potassium chloride (KLOR-CON) 20 MEQ packet Take by mouth 2 (two) times daily.    . tamsulosin (FLOMAX) 0.4 MG CAPS capsule Take 1 capsule (0.4 mg total) by mouth daily.  90 capsule 3  . ciprofloxacin (CIPRO) 500 MG tablet Take 1 tablet (500 mg total) by mouth 2 (two) times daily. 20 tablet 0  . varenicline (CHANTIX STARTING MONTH PAK) 0.5 MG X 11 & 1 MG X 42 tablet Take one 0.5mg  tablet by mouth once daily for 3 days, then increase to one 0.5mg  tablet twice daily for 3 days, then increase to one 1mg  tablet twice daily. 53 tablet 0   No current facility-administered medications for this visit.   No Known Allergies   Exam:  BP 131/70 mmHg  Pulse 70  Wt 144 lb (65.318 kg)  SpO2 98% Gen: Well NAD HEENT: EOMI,  MMM Lungs: Normal work of breathing. CTABL Heart: RRR Soft radiating systolic murmur Abd: NABS, Soft. Nondistended, Nontender Exts: Brisk capillary refill, warm and well perfused.  Normal gait Prostate exam deferred by patient  Results for orders placed or performed in visit on 10/27/15 (from the past 24 hour(s))  POCT Urinalysis Dipstick     Status: Normal   Collection Time: 10/27/15 12:02 PM  Result Value Ref Range   Color, UA yellow    Clarity, UA clear    Glucose, UA neg    Bilirubin, UA neg    Ketones, UA neg    Spec Grav, UA 1.010    Blood, UA neg    pH, UA 6.5    Protein, UA neg    Urobilinogen, UA 0.2    Nitrite, UA neg    Leukocytes, UA Negative Negative   No results found.   Please see individual assessment and plan sections.

## 2015-10-27 NOTE — Patient Instructions (Signed)
Get fasting labs soon.  Take cipro for parostitis/UTI.  Give urine sample.  Take chantix for smoking.  We will refill it in 1 month.  Return in 1 month.  You should hear from the ultrasound people about the belly ultrasound soon.   Varenicline oral tablets What is this medicine? VARENICLINE (var EN i kleen) is used to help people quit smoking. It can reduce the symptoms caused by stopping smoking. It is used with a patient support program recommended by your physician. This medicine may be used for other purposes; ask your health care provider or pharmacist if you have questions. What should I tell my health care provider before I take this medicine? They need to know if you have any of these conditions: -bipolar disorder, depression, schizophrenia or other mental illness -heart disease -if you often drink alcohol -kidney disease -peripheral vascular disease -seizures -stroke -suicidal thoughts, plans, or attempt; a previous suicide attempt by you or a family member -an unusual or allergic reaction to varenicline, other medicines, foods, dyes, or preservatives -pregnant or trying to get pregnant -breast-feeding How should I use this medicine? Take this medicine by mouth after eating. Take with a full glass of water. Follow the directions on the prescription label. Take your doses at regular intervals. Do not take your medicine more often than directed. There are 3 ways you can use this medicine to help you quit smoking; talk to your health care professional to decide which plan is right for you: 1) you can choose a quit date and start this medicine 1 week before the quit date, or, 2) you can start taking this medicine before you choose a quit date, and then pick a quit date between day 8 and 35 days of treatment, or, 3) if you are not sure that you are able or willing to quit smoking right away, start taking this medicine and slowly decrease the amount you smoke as directed by your  health care professional with the goal of being cigarette-free by week 12 of treatment. Stick to your plan; ask about support groups or other ways to help you remain cigarette-free. If you are motivated to quit smoking and did not succeed during a previous attempt with this medicine for reasons other than side effects, or if you returned to smoking after this treatment, speak with your health care professional about whether another course of this medicine may be right for you. A special MedGuide will be given to you by the pharmacist with each prescription and refill. Be sure to read this information carefully each time. Talk to your pediatrician regarding the use of this medicine in children. This medicine is not approved for use in children. Overdosage: If you think you have taken too much of this medicine contact a poison control center or emergency room at once. NOTE: This medicine is only for you. Do not share this medicine with others. What if I miss a dose? If you miss a dose, take it as soon as you can. If it is almost time for your next dose, take only that dose. Do not take double or extra doses. What may interact with this medicine? -alcohol or any product that contains alcohol -insulin -other stop smoking aids -theophylline -warfarin This list may not describe all possible interactions. Give your health care provider a list of all the medicines, herbs, non-prescription drugs, or dietary supplements you use. Also tell them if you smoke, drink alcohol, or use illegal drugs. Some items may interact with  your medicine. What should I watch for while using this medicine? Visit your doctor or health care professional for regular check ups. Ask for ongoing advice and encouragement from your doctor or healthcare professional, friends, and family to help you quit. If you smoke while on this medication, quit again Your mouth may get dry. Chewing sugarless gum or sucking hard candy, and drinking  plenty of water may help. Contact your doctor if the problem does not go away or is severe. You may get drowsy or dizzy. Do not drive, use machinery, or do anything that needs mental alertness until you know how this medicine affects you. Do not stand or sit up quickly, especially if you are an older patient. This reduces the risk of dizzy or fainting spells. Sleepwalking can happen during treatment with this medicine, and can sometimes lead to behavior that is harmful to you, other people, or property. Stop taking this medicine and tell your doctor if you start sleepwalking or have other unusual sleep-related activity. Decrease the amount of alcoholic beverages that you drink during treatment with this medicine until you know if this medicine affects your ability to tolerate alcohol. Some people have experienced increased drunkenness (intoxication), unusual or sometimes aggressive behavior, or no memory of things that have happened (amnesia) during treatment with this medicine. The use of this medicine may increase the chance of suicidal thoughts or actions. Pay special attention to how you are responding while on this medicine. Any worsening of mood, or thoughts of suicide or dying should be reported to your health care professional right away. What side effects may I notice from receiving this medicine? Side effects that you should report to your doctor or health care professional as soon as possible: -allergic reactions like skin rash, itching or hives, swelling of the face, lips, tongue, or throat -acting aggressive, being angry or violent, or acting on dangerous impulses -breathing problems -changes in vision -chest pain or chest tightness -confusion, trouble speaking or understanding -new or worsening depression, anxiety, or panic attacks -extreme increase in activity and talking (mania) -fast, irregular heartbeat -feeling faint or lightheaded, falls -fever -pain in legs when  walking -problems with balance, talking, walking -redness, blistering, peeling or loosening of the skin, including inside the mouth -ringing in ears -seeing or hearing things that aren't there (hallucinations) -seizures -sleepwalking -sudden numbness or weakness of the face, arm or leg -thoughts about suicide or dying, or attempts to commit suicide -trouble passing urine or change in the amount of urine -unusual bleeding or bruising -unusually weak or tired Side effects that usually do not require medical attention (report to your doctor or health care professional if they continue or are bothersome): -constipation -headache -nausea, vomiting -strange dreams -stomach gas -trouble sleeping This list may not describe all possible side effects. Call your doctor for medical advice about side effects. You may report side effects to FDA at 1-800-FDA-1088. Where should I keep my medicine? Keep out of the reach of children. Store at room temperature between 15 and 30 degrees C (59 and 86 degrees F). Throw away any unused medicine after the expiration date. NOTE: This sheet is a summary. It may not cover all possible information. If you have questions about this medicine, talk to your doctor, pharmacist, or health care provider.    2016, [employer]vier/[employer]. (2015-03-24 16:14:23)

## 2015-10-27 NOTE — Assessment & Plan Note (Signed)
Fasting lipids and medicine management per cardiology

## 2015-10-27 NOTE — Telephone Encounter (Signed)
Received records from [facility] as requested by Dr Stanford Breed.  Records were given to Dr Stanford Breed for review.  Patient also has upcoming appointment 11/30/15 with Dr Stanford Breed.

## 2015-10-27 NOTE — Assessment & Plan Note (Signed)
On Eliquis good rate control. Management per cardiology

## 2015-10-27 NOTE — Assessment & Plan Note (Signed)
Obtain screening ultrasound for aortic aneurysm. Long history of smoking.

## 2015-10-27 NOTE — Assessment & Plan Note (Signed)
Urine culture pending. Likely prostatitis. Treat with Cipro. Prostate exam declined by patient.

## 2015-10-27 NOTE — Assessment & Plan Note (Signed)
Well controlled with current lisinopril

## 2015-10-27 NOTE — Assessment & Plan Note (Signed)
Echocardiogram pending.   

## 2015-10-27 NOTE — Assessment & Plan Note (Signed)
Discussed treatment options. Plan for Chantix. Recheck in a month

## 2015-10-29 LAB — URINE CULTURE
Colony Count: NO GROWTH
Organism ID, Bacteria: NO GROWTH

## 2015-10-31 ENCOUNTER — Encounter: Payer: Self-pay | Admitting: Family Medicine

## 2015-10-31 ENCOUNTER — Ambulatory Visit (INDEPENDENT_AMBULATORY_CARE_PROVIDER_SITE_OTHER): Payer: Medicare Other | Admitting: Family Medicine

## 2015-10-31 ENCOUNTER — Ambulatory Visit (INDEPENDENT_AMBULATORY_CARE_PROVIDER_SITE_OTHER): Payer: Medicare Other

## 2015-10-31 VITALS — BP 149/69 | HR 92 | Wt 144.0 lb

## 2015-10-31 DIAGNOSIS — M25561 Pain in right knee: Secondary | ICD-10-CM | POA: Diagnosis not present

## 2015-10-31 DIAGNOSIS — I2510 Atherosclerotic heart disease of native coronary artery without angina pectoris: Secondary | ICD-10-CM

## 2015-10-31 DIAGNOSIS — M25562 Pain in left knee: Secondary | ICD-10-CM

## 2015-10-31 DIAGNOSIS — M170 Bilateral primary osteoarthritis of knee: Secondary | ICD-10-CM | POA: Diagnosis not present

## 2015-10-31 NOTE — Progress Notes (Signed)
Bradley Stanley is a 82 y.o. male who presents to [facility] today for left knee pain. Patient notes a several week history of knee pain occurring off and on with some locking and catching. However he notes down over the weekend and stood up and felt significant pain in the posterior lateral corner of the knee. He notes that it stiff and painful. He denies further locking or catching or giving way. Symptoms are mild to moderate.   Past Medical History  Diagnosis Date  . High cholesterol   . Hypertension   . Renal insufficiency   . BPH (benign prostatic hyperplasia)   . GERD (gastroesophageal reflux disease)   . CAD (coronary artery disease)    Past Surgical History  Procedure Laterality Date  . Hernia repair    . Cataract extraction, bilateral    . Coronary artery bypass graft     Social History  Substance Use Topics  . Smoking status: Current Every Day Smoker  . Smokeless tobacco: Not on file  . Alcohol Use: 0.0 oz/week    0 Standard drinks or equivalent per week     Comment: Occasional   family history includes CAD in his father; Diabetes in his brother and sister.  ROS:  No headache, visual changes, nausea, vomiting, diarrhea, constipation, dizziness, abdominal pain, skin rash, fevers, chills, night sweats, weight loss, swollen lymph nodes, body aches, joint swelling, muscle aches, chest pain, shortness of breath, mood changes, visual or auditory hallucinations.    Medications: Current Outpatient Prescriptions  Medication Sig Dispense Refill  . apixaban (ELIQUIS) 5 MG TABS tablet Take 5 mg by mouth 2 (two) times daily.    Marland Kitchen aspirin 81 MG tablet Take 81 mg by mouth daily.    Marland Kitchen atorvastatin (LIPITOR) 80 MG tablet Take 1 tablet (80 mg total) by mouth daily. 90 tablet 3  . Cetirizine HCl 10 MG CAPS Take 10 mg by mouth daily.    . ciprofloxacin (CIPRO) 500 MG tablet Take 1 tablet (500 mg total) by mouth 2 (two) times daily. 20 tablet  0  . doxazosin (CARDURA) 4 MG tablet Take 1 tablet (4 mg total) by mouth daily. 90 tablet 2  . ferrous sulfate 325 (65 FE) MG EC tablet Take 325 mg by mouth 3 (three) times daily with meals.    . furosemide (LASIX) 40 MG tablet Take 40 mg by mouth.    Marland Kitchen lisinopril (PRINIVIL,ZESTRIL) 10 MG tablet Take 10 mg by mouth daily.    Marland Kitchen LORazepam (ATIVAN) 0.5 MG tablet Take 1 tablet (0.5 mg total) by mouth every 8 (eight) hours as needed for anxiety. 30 tablet 0  . metoprolol succinate (TOPROL-XL) 50 MG 24 hr tablet Take 50 mg by mouth daily. Take with or immediately following a meal.    . Multiple Vitamin (MULTIVITAMIN) tablet Take 1 tablet by mouth daily.    Marland Kitchen omeprazole (PRILOSEC) 20 MG capsule Take 20 mg by mouth daily.    . potassium chloride (KLOR-CON) 20 MEQ packet Take by mouth 2 (two) times daily.    . tamsulosin (FLOMAX) 0.4 MG CAPS capsule Take 1 capsule (0.4 mg total) by mouth daily. 90 capsule 3  . varenicline (CHANTIX STARTING MONTH PAK) 0.5 MG X 11 & 1 MG X 42 tablet Take one 0.5mg  tablet by mouth once daily for 3 days, then increase to one 0.5mg  tablet twice daily for 3 days, then increase to one 1mg  tablet twice daily. 53 tablet 0  No current facility-administered medications for this visit.   No Known Allergies   Exam:  BP 149/69 mmHg  Pulse 92  Wt 144 lb (65.318 kg) General: Well Developed, well nourished, and in no acute distress.  Neuro/Psych: Alert and oriented x3, extra-ocular muscles intact, able to move all 4 extremities, sensation grossly intact. Skin: Warm and dry, no rashes noted.  Respiratory: Not using accessory muscles, speaking in full sentences, trachea midline.  Cardiovascular: Pulses palpable, no extremity edema. Abdomen: Does not appear distended. MSK: Left knee relatively normal-appearing. Range of motion 0-120 with 1+ retropatellar crepitations. Tender to palpation lateral posterior corner nontender otherwise. Positive lateral McMurray's testing negative  medially. Stable valgus and varus stress test. Negative anterior posterior drawer. Minimally tender with resisted knee flexion and resisted foot plantar flexion. Normal gait.  X-ray left knee: Some DJD present no other specific bony abnormalities. Relatively normal knee x-ray.  Procedure: Real-time Ultrasound Guided Injection of Left Knee  Device: GE Logiq E  Images permanently stored and available for review in the ultrasound unit. Verbal informed consent obtained. Discussed risks and benefits of procedure. Warned about infection bleeding damage to structures skin hypopigmentation and fat atrophy among others. Patient expresses understanding and agreement Time-out conducted.  Noted no overlying erythema, induration, or other signs of local infection.  Skin prepped in a sterile fashion.  Local anesthesia: Topical Ethyl chloride.  With sterile technique and under real time ultrasound guidance: 80 milligrams of Kenalog and 4 mL of Marcaine injected easily.  Completed without difficulty  Pain immediately resolved suggesting accurate placement of the medication.  Advised to call if fevers/chills, erythema, induration, drainage, or persistent bleeding.  Images permanently stored and available for review in the ultrasound unit.  Impression: Technically successful ultrasound guided injection.   Ultrasound-guided used for increased accuracy.  No results found for this or any previous visit (from the past 24 hour(s)). No results found.   Please see individual assessment and plan sections.

## 2015-10-31 NOTE — Patient Instructions (Signed)
Thank you for coming in today. Return in 2-4 weeks.  Call or go to the ER if you develop a large red swollen joint with extreme pain or oozing puss.   You can use the Picato Gell once daily for 3 days but it will really irritate your scalp and skin.   Meniscus Tear With Phase I Rehab The meniscus is a C-shaped cartilage structure, located in the knee joint between the thigh bone (femur) and the shinbone (tibia). Two menisci are located in each knee joint: the inner and outer meniscus. The meniscus acts as an adapter between the thigh bone and shinbone, allowing them to fit properly together. It also functions as a shock absorber, to reduce the stress placed on the knee joint and to help supply nutrients to the knee joint cartilage. As people age, the meniscus begins to harden and become more vulnerable to injury. Meniscus tears are a common injury, especially in older athletes. Inner meniscus tears are more common than outer meniscus tears.  SYMPTOMS   Pain in the knee, especially with standing or squatting with the affected leg.  Tenderness along the joint line.  Swelling in the knee joint (effusion), usually starting 1 to 2 days after injury.  Locking or catching of the knee joint, causing inability to straighten the knee completely.  Giving way or buckling of the knee. CAUSES  A meniscus tear occurs when a force is placed on the meniscus that is greater than it can handle. Common causes of injury include:  Direct hit (trauma) to the knee.  Twisting, pivoting, or cutting (rapidly changing direction while running), kneeling or squatting.  Without injury, due to aging. RISK INCREASES WITH:  Contact sports (football, rugby).  Sports in which cleats are used with pivoting (soccer, lacrosse) or sports in which good shoe grip and sudden change in direction are required (racquetball, basketball, squash).  Previous knee injury.  Associated knee injury, particularly ligament  injuries.  Poor strength and flexibility. PREVENTION  Warm up and stretch properly before activity.  Maintain physical fitness:  Strength, flexibility, and endurance.  Cardiovascular fitness.  Protect the knee with a brace or elastic bandage.  Wear properly fitted protective equipment (proper cleats for the surface). PROGNOSIS  Sometimes, meniscus tears heal on their own. However, definitive treatment requires surgery, followed by at least 6 weeks of recovery.  RELATED COMPLICATIONS   Recurring symptoms that result in a chronic problem.  Repeated knee injury, especially if sports are resumed too soon after injury or surgery.  Progression of the tear (the tear gets larger), if untreated.  Arthritis of the knee in later years (with or without surgery).  Complications of surgery, including infection, bleeding, injury to nerves (numbness, weakness, paralysis) continued pain, giving way, locking, nonhealing of meniscus (if repaired), need for further surgery, and knee stiffness (loss of motion). TREATMENT  Treatment first involves the use of ice and medicine, to reduce pain and inflammation. You may find using crutches to walk more comfortable. However, it is okay to bear weight on the injured knee, if the pain will allow it. Surgery is often advised as a definitive treatment. Surgery is performed through an incision near the joint (arthroscopically). The torn piece of the meniscus is removed, and if possible the joint cartilage is repaired. After surgery, the joint must be restrained. After restraint, it is important to perform strengthening and stretching exercises to help regain strength and a full range of motion. These exercises may be completed at home or with  a therapist.  MEDICATION  If pain medicine is needed, nonsteroidal anti-inflammatory medicines (aspirin and ibuprofen), or other minor pain relievers (acetaminophen), are often advised.  Do not take pain medicine for 7 days  before surgery.  Prescription pain relievers may be given, if your caregiver thinks they are needed. Use only as directed and only as much as you need. HEAT AND COLD  Cold treatment (icing) should be applied for 10 to 15 minutes every 2 to 3 hours for inflammation and pain, and immediately after activity that aggravates your symptoms. Use ice packs or an ice massage.  Heat treatment may be used before performing stretching and strengthening activities prescribed by your caregiver, physical therapist, or athletic trainer. Use a heat pack or a warm water soak. SEEK MEDICAL CARE IF:   Symptoms get worse or do not improve in 2 weeks, despite treatment.  New, unexplained symptoms develop. (Drugs used in treatment may produce side effects.) EXERCISES RANGE OF MOTION (ROM) AND STRETCHING EXERCISES - Meniscus Tear, Non-operative, Phase I These are some of the initial exercises with which you may start your rehabilitation program, until you see your caregiver again or until your symptoms are resolved. Remember:   These initial exercises are intended to be gentle. They will help you restore motion without increasing any swelling.  Completing these exercises allows less painful movement and prepares you for the more aggressive strengthening exercises in Phase II.  An effective stretch should be held for at least 30 seconds.  A stretch should never be painful. You should only feel a gentle lengthening or release in the stretched tissue. RANGE OF MOTION - Knee Flexion, Active  Lie on your back with both knees straight. (If this causes back discomfort, bend your healthy knee, placing your foot flat on the floor.)  Slowly slide your heel back toward your buttocks until you feel a gentle stretch in the front of your knee or thigh.  Hold for __________ seconds. Slowly slide your heel back to the starting position. Repeat __________ times. Complete this exercise __________ times per day.  RANGE OF  MOTION - Knee Flexion and Extension, Active-Assisted  Sit on the edge of a table or chair with your thighs firmly supported. It may be helpful to place a folded towel under the end of your right / left thigh.  Flexion (bending): Place the ankle of your healthy leg on top of the other ankle. Use your healthy leg to gently bend your right / left knee until you feel a mild tension across the top of your knee.  Hold for __________ seconds.  Extension (straightening): Switch your ankles so your right / left leg is on top. Use your healthy leg to straighten your right / left knee until you feel a mild tension on the backside of your knee.  Hold for __________ seconds. Repeat __________ times. Complete __________ times per day. STRETCH - Knee Flexion, Supine  Lie on the floor with your right / left heel and foot lightly touching the wall. (Place both feet on the wall if you do not use a door frame.)  Without using any effort, allow gravity to slide your foot down the wall slowly until you feel a gentle stretch in the front of your right / left knee.  Hold this stretch for __________ seconds. Then return the leg to the starting position, using your healthy leg for help, if needed. Repeat __________ times. Complete this stretch __________ times per day.  STRETCH - Knee Extension Sitting  Sit with your right / left leg/heel propped on another chair, coffee table, or foot stool.  Allow your leg muscles to relax, letting gravity straighten out your knee.*  You should feel a stretch behind your right / left knee. Hold this position for __________ seconds. Repeat __________ times. Complete this stretch __________ times per day.  *Your physician, physical therapist or athletic trainer may instruct you place a __________ weight on your thigh, just above your kneecap, to deepen the stretch.  STRENGTHENING EXERCISES - Meniscus Tear, Non-operative, Phase I These exercises may help you when beginning to  rehabilitate your injury. They may resolve your symptoms with or without further involvement from your physician, physical therapist or athletic trainer. While completing these exercises, remember:   Muscles can gain both the endurance and the strength needed for everyday activities through controlled exercises.  Complete these exercises as instructed by your physician, physical therapist or athletic trainer. Progress the resistance and repetitions only as guided. STRENGTH - Quadriceps, Isometrics  Lie on your back with your right / left leg extended and your opposite knee bent.  Gradually tense the muscles in the front of your right / left thigh. You should see either your knee cap slide up toward your hip or increased dimpling just above the knee. This motion will push the back of the knee down toward the floor, mat, or bed on which you are lying.  Hold the muscle as tight as you can, without increasing your pain, for __________ seconds.  Relax the muscles slowly and completely between each repetition. Repeat __________ times. Complete this exercise __________ times per day.  STRENGTH - Quadriceps, Short Arcs   Lie on your back. Place a __________ inch towel roll under your right / left knee, so that the knee bends slightly.  Raise only your lower leg by tightening the muscles in the front of your thigh. Do not allow your thigh to rise.  Hold this position for __________ seconds. Repeat __________ times. Complete this exercise __________ times per day.  OPTIONAL ANKLE WEIGHTS: Begin with ____________________, but DO NOT exceed ____________________. Increase in 1 pound/0.5 kilogram increments. STRENGTH - Quadriceps, Straight Leg Raises  Quality counts! Watch for signs that the quadriceps muscle is working, to be sure you are strengthening the correct muscles and not "cheating" by substituting with healthier muscles.  Lay on your back with your right / left leg extended and your opposite  knee bent.  Tense the muscles in the front of your right / left thigh. You should see either your knee cap slide up or increased dimpling just above the knee. Your thigh may even shake a bit.  Tighten these muscles even more and raise your leg 4 to 6 inches off the floor. Hold for __________ seconds.  Keeping these muscles tense, lower your leg.  Relax the muscles slowly and completely in between each repetition. Repeat __________ times. Complete this exercise __________ times per day.  STRENGTH - Hamstring, Curls   Lay on your stomach with your legs extended. (If you lay on a bed, your feet may hang over the edge.)  Tighten the muscles in the back of your thigh to bend your right / left knee up to 90 degrees. Keep your hips flat on the bed.  Hold this position for __________ seconds.  Slowly lower your leg back to the starting position. Repeat __________ times. Complete this exercise __________ times per day.  STRENGTH - Quadriceps, Squats  Stand in a door frame so  that your feet and knees are in line with the frame.  Use your hands for balance, not support, on the frame.  Slowly lower your weight, bending at the hips and knees. Keep your lower legs upright so that they are parallel with the door frame. Squat only within the range that does not increase your knee pain. Never let your hips drop below your knees.  Slowly return upright, pushing with your legs, not pulling with your hands. Repeat __________ times. Complete this exercise __________ times per day.  STRENGTH - Quad/VMO, Isometric   Sit in a chair with your right / left knee slightly bent. With your fingertips, feel the VMO muscle just above the inside of your knee. The VMO is important in controlling the position of your kneecap.  Keeping your fingertips on this muscle. Without actually moving your leg, attempt to drive your knee down as if straightening your leg. You should feel your VMO tense. If you have a difficult  time, you may wish to try the same exercise on your healthy knee first.  Tense this muscle as hard as you can without increasing any knee pain.  Hold for __________ seconds. Relax the muscles slowly and completely in between each repetition. Repeat __________ times. Complete exercise __________ times per day.    This information is not intended to replace advice given to you by your health care provider. Make sure you discuss any questions you have with your health care provider.   Document Released: 07/23/1998 Document Revised: 11/23/2014 Document Reviewed: 10/21/2008 Elsevier Interactive Patient Education [employer].

## 2015-10-31 NOTE — Assessment & Plan Note (Signed)
Status post injection today. Likely posterior lateral corner of the meniscus. Return in a few weeks. If not improved would consider MRI at that time.

## 2015-11-01 NOTE — Progress Notes (Signed)
Quick Note:  Some arthritis is present but not too much. ______

## 2015-11-03 ENCOUNTER — Inpatient Hospital Stay (HOSPITAL_COMMUNITY)
Admission: EM | Admit: 2015-11-03 | Discharge: 2015-11-08 | DRG: 380 | Disposition: A | Discharge status: Home / Self Care | Payer: Medicare Other | Attending: Internal Medicine | Admitting: Internal Medicine

## 2015-11-03 ENCOUNTER — Encounter (HOSPITAL_COMMUNITY): Payer: Self-pay | Admitting: *Deleted

## 2015-11-03 ENCOUNTER — Emergency Department (HOSPITAL_COMMUNITY): Payer: Medicare Other

## 2015-11-03 ENCOUNTER — Ambulatory Visit (INDEPENDENT_AMBULATORY_CARE_PROVIDER_SITE_OTHER): Payer: Medicare Other | Admitting: Family Medicine

## 2015-11-03 VITALS — BP 108/67 | HR 94 | Temp 97.9°F | Wt 134.0 lb

## 2015-11-03 DIAGNOSIS — K210 Gastro-esophageal reflux disease with esophagitis: Secondary | ICD-10-CM | POA: Diagnosis present

## 2015-11-03 DIAGNOSIS — I129 Hypertensive chronic kidney disease with stage 1 through stage 4 chronic kidney disease, or unspecified chronic kidney disease: Secondary | ICD-10-CM | POA: Diagnosis present

## 2015-11-03 DIAGNOSIS — A084 Viral intestinal infection, unspecified: Secondary | ICD-10-CM | POA: Diagnosis present

## 2015-11-03 DIAGNOSIS — Z720 Tobacco use: Secondary | ICD-10-CM

## 2015-11-03 DIAGNOSIS — E7800 Pure hypercholesterolemia, unspecified: Secondary | ICD-10-CM | POA: Diagnosis present

## 2015-11-03 DIAGNOSIS — Z951 Presence of aortocoronary bypass graft: Secondary | ICD-10-CM

## 2015-11-03 DIAGNOSIS — N184 Chronic kidney disease, stage 4 (severe): Secondary | ICD-10-CM | POA: Diagnosis present

## 2015-11-03 DIAGNOSIS — I10 Essential (primary) hypertension: Secondary | ICD-10-CM | POA: Diagnosis not present

## 2015-11-03 DIAGNOSIS — I4891 Unspecified atrial fibrillation: Secondary | ICD-10-CM

## 2015-11-03 DIAGNOSIS — E43 Unspecified severe protein-calorie malnutrition: Secondary | ICD-10-CM | POA: Diagnosis not present

## 2015-11-03 DIAGNOSIS — R1110 Vomiting, unspecified: Secondary | ICD-10-CM | POA: Diagnosis not present

## 2015-11-03 DIAGNOSIS — R05 Cough: Secondary | ICD-10-CM | POA: Diagnosis not present

## 2015-11-03 DIAGNOSIS — E876 Hypokalemia: Secondary | ICD-10-CM | POA: Diagnosis present

## 2015-11-03 DIAGNOSIS — F419 Anxiety disorder, unspecified: Secondary | ICD-10-CM | POA: Diagnosis present

## 2015-11-03 DIAGNOSIS — K259 Gastric ulcer, unspecified as acute or chronic, without hemorrhage or perforation: Secondary | ICD-10-CM | POA: Diagnosis present

## 2015-11-03 DIAGNOSIS — K311 Adult hypertrophic pyloric stenosis: Principal | ICD-10-CM | POA: Diagnosis present

## 2015-11-03 DIAGNOSIS — I2510 Atherosclerotic heart disease of native coronary artery without angina pectoris: Secondary | ICD-10-CM | POA: Diagnosis present

## 2015-11-03 DIAGNOSIS — Z9842 Cataract extraction status, left eye: Secondary | ICD-10-CM

## 2015-11-03 DIAGNOSIS — Z79899 Other long term (current) drug therapy: Secondary | ICD-10-CM

## 2015-11-03 DIAGNOSIS — Z961 Presence of intraocular lens: Secondary | ICD-10-CM | POA: Diagnosis present

## 2015-11-03 DIAGNOSIS — K2980 Duodenitis without bleeding: Secondary | ICD-10-CM | POA: Diagnosis present

## 2015-11-03 DIAGNOSIS — I951 Orthostatic hypotension: Secondary | ICD-10-CM

## 2015-11-03 DIAGNOSIS — E870 Hyperosmolality and hypernatremia: Secondary | ICD-10-CM | POA: Diagnosis not present

## 2015-11-03 DIAGNOSIS — R9431 Abnormal electrocardiogram [ECG] [EKG]: Secondary | ICD-10-CM | POA: Diagnosis not present

## 2015-11-03 DIAGNOSIS — E860 Dehydration: Secondary | ICD-10-CM | POA: Diagnosis present

## 2015-11-03 DIAGNOSIS — Z9841 Cataract extraction status, right eye: Secondary | ICD-10-CM

## 2015-11-03 DIAGNOSIS — I739 Peripheral vascular disease, unspecified: Secondary | ICD-10-CM | POA: Diagnosis present

## 2015-11-03 DIAGNOSIS — N179 Acute kidney failure, unspecified: Secondary | ICD-10-CM | POA: Diagnosis not present

## 2015-11-03 DIAGNOSIS — F17200 Nicotine dependence, unspecified, uncomplicated: Secondary | ICD-10-CM | POA: Diagnosis present

## 2015-11-03 DIAGNOSIS — K5900 Constipation, unspecified: Secondary | ICD-10-CM | POA: Diagnosis present

## 2015-11-03 DIAGNOSIS — Z7982 Long term (current) use of aspirin: Secondary | ICD-10-CM

## 2015-11-03 DIAGNOSIS — R112 Nausea with vomiting, unspecified: Secondary | ICD-10-CM | POA: Diagnosis present

## 2015-11-03 DIAGNOSIS — Z681 Body mass index (BMI) 19 or less, adult: Secondary | ICD-10-CM

## 2015-11-03 DIAGNOSIS — I252 Old myocardial infarction: Secondary | ICD-10-CM

## 2015-11-03 DIAGNOSIS — E785 Hyperlipidemia, unspecified: Secondary | ICD-10-CM | POA: Diagnosis present

## 2015-11-03 DIAGNOSIS — N183 Chronic kidney disease, stage 3 unspecified: Secondary | ICD-10-CM | POA: Diagnosis present

## 2015-11-03 DIAGNOSIS — I480 Paroxysmal atrial fibrillation: Secondary | ICD-10-CM | POA: Diagnosis present

## 2015-11-03 DIAGNOSIS — Z7901 Long term (current) use of anticoagulants: Secondary | ICD-10-CM

## 2015-11-03 DIAGNOSIS — N400 Enlarged prostate without lower urinary tract symptoms: Secondary | ICD-10-CM | POA: Diagnosis present

## 2015-11-03 DIAGNOSIS — I9789 Other postprocedural complications and disorders of the circulatory system, not elsewhere classified: Secondary | ICD-10-CM

## 2015-11-03 DIAGNOSIS — D72829 Elevated white blood cell count, unspecified: Secondary | ICD-10-CM | POA: Diagnosis present

## 2015-11-03 HISTORY — DX: Unspecified chronic bronchitis: J42

## 2015-11-03 HISTORY — DX: Pneumonia, unspecified organism: J18.9

## 2015-11-03 HISTORY — DX: Anxiety disorder, unspecified: F41.9

## 2015-11-03 HISTORY — DX: Acute myocardial infarction, unspecified: I21.9

## 2015-11-03 HISTORY — DX: Cardiac murmur, unspecified: R01.1

## 2015-11-03 HISTORY — DX: Unspecified atrial fibrillation: I48.91

## 2015-11-03 LAB — CBC
HCT: 36.9 % — ABNORMAL LOW (ref 39.0–52.0)
HEMOGLOBIN: 12 g/dL — AB (ref 13.0–17.0)
MCH: 29.8 pg (ref 26.0–34.0)
MCHC: 32.5 g/dL (ref 30.0–36.0)
MCV: 91.6 fL (ref 78.0–100.0)
PLATELETS: 295 10*3/uL (ref 150–400)
RBC: 4.03 MIL/uL — AB (ref 4.22–5.81)
RDW: 16.8 % — ABNORMAL HIGH (ref 11.5–15.5)
WBC: 13.9 10*3/uL — AB (ref 4.0–10.5)

## 2015-11-03 LAB — URINALYSIS, ROUTINE W REFLEX MICROSCOPIC
BILIRUBIN URINE: NEGATIVE
BILIRUBIN URINE: NEGATIVE
GLUCOSE, UA: NEGATIVE mg/dL
Glucose, UA: NEGATIVE mg/dL
HGB URINE DIPSTICK: NEGATIVE
HGB URINE DIPSTICK: NEGATIVE
Ketones, ur: NEGATIVE mg/dL
Ketones, ur: NEGATIVE mg/dL
Leukocytes, UA: NEGATIVE
Leukocytes, UA: NEGATIVE
Nitrite: NEGATIVE
Nitrite: NEGATIVE
PH: 6.5 (ref 5.0–8.0)
PROTEIN: NEGATIVE mg/dL
Protein, ur: NEGATIVE mg/dL
SPECIFIC GRAVITY, URINE: 1.018 (ref 1.005–1.030)
SPECIFIC GRAVITY, URINE: 1.016 (ref 1.005–1.030)
pH: 5 (ref 5.0–8.0)

## 2015-11-03 LAB — CBC WITH DIFFERENTIAL/PLATELET
Basophils Absolute: 0 10*3/uL (ref 0.0–0.1)
Basophils Relative: 0 %
EOS ABS: 0 10*3/uL (ref 0.0–0.7)
EOS PCT: 0 %
HCT: 41 % (ref 39.0–52.0)
Hemoglobin: 13.5 g/dL (ref 13.0–17.0)
LYMPHS ABS: 1.7 10*3/uL (ref 0.7–4.0)
Lymphocytes Relative: 12 %
MCH: 29.9 pg (ref 26.0–34.0)
MCHC: 32.9 g/dL (ref 30.0–36.0)
MCV: 90.7 fL (ref 78.0–100.0)
Monocytes Absolute: 1.7 10*3/uL — ABNORMAL HIGH (ref 0.1–1.0)
Monocytes Relative: 12 %
Neutro Abs: 10.3 10*3/uL — ABNORMAL HIGH (ref 1.7–7.7)
Neutrophils Relative %: 76 %
PLATELETS: 335 10*3/uL (ref 150–400)
RBC: 4.52 MIL/uL (ref 4.22–5.81)
RDW: 16.7 % — ABNORMAL HIGH (ref 11.5–15.5)
WBC: 13.8 10*3/uL — AB (ref 4.0–10.5)

## 2015-11-03 LAB — COMPREHENSIVE METABOLIC PANEL
ALK PHOS: 79 U/L (ref 38–126)
ALT: 71 U/L — AB (ref 17–63)
AST: 48 U/L — AB (ref 15–41)
Albumin: 4.2 g/dL (ref 3.5–5.0)
Anion gap: 17 — ABNORMAL HIGH (ref 5–15)
BILIRUBIN TOTAL: 0.6 mg/dL (ref 0.3–1.2)
BUN: 68 mg/dL — AB (ref 6–20)
CALCIUM: 9.4 mg/dL (ref 8.9–10.3)
CO2: 39 mmol/L — ABNORMAL HIGH (ref 22–32)
Chloride: 84 mmol/L — ABNORMAL LOW (ref 101–111)
Creatinine, Ser: 3.06 mg/dL — ABNORMAL HIGH (ref 0.61–1.24)
GFR calc Af Amer: 21 mL/min — ABNORMAL LOW (ref >60)
GFR, EST NON AFRICAN AMERICAN: 18 mL/min — AB (ref >60)
Glucose, Bld: 159 mg/dL — ABNORMAL HIGH (ref 65–99)
Potassium: 3.2 mmol/L — ABNORMAL LOW (ref 3.5–5.1)
Sodium: 140 mmol/L (ref 135–145)
TOTAL PROTEIN: 7.3 g/dL (ref 6.5–8.1)

## 2015-11-03 LAB — I-STAT CG4 LACTIC ACID, ED
Lactic Acid, Venous: 2.1 mmol/L (ref 0.5–2.0)
Lactic Acid, Venous: 1.53 mmol/L (ref 0.5–2.0)

## 2015-11-03 LAB — SODIUM, URINE, RANDOM: Sodium, Ur: 57 mmol/L

## 2015-11-03 LAB — I-STAT TROPONIN, ED: Troponin i, poc: 0.06 ng/mL (ref 0.00–0.08)

## 2015-11-03 LAB — CREATININE, URINE, RANDOM: Creatinine, Urine: 104.6 mg/dL

## 2015-11-03 MED ORDER — SODIUM CHLORIDE 0.9% FLUSH
3.0000 mL | Freq: Two times a day (BID) | INTRAVENOUS | Status: DC
  Administered 2015-11-03 – 2015-11-08 (×5): 3 mL via INTRAVENOUS

## 2015-11-03 MED ORDER — ACETAMINOPHEN 650 MG RE SUPP: 650.0000 mg | Freq: Four times a day (QID) | RECTAL | Status: DC | PRN

## 2015-11-03 MED ORDER — HEPARIN SODIUM (PORCINE) 5000 UNIT/ML IJ SOLN
5000.0000 [IU] | Freq: Three times a day (TID) | INTRAMUSCULAR | Status: DC
Start: 2015-11-03 — End: 2015-11-04
  Administered 2015-11-04 (×2): 5000 [IU] via SUBCUTANEOUS
  Filled 2015-11-03: qty 1

## 2015-11-03 MED ORDER — LORAZEPAM 2 MG/ML IJ SOLN
0.5000 mg | INTRAMUSCULAR | Status: DC | PRN
  Administered 2015-11-03: 0.5 mg via INTRAVENOUS
  Filled 2015-11-03: qty 1

## 2015-11-03 MED ORDER — ALBUTEROL SULFATE (2.5 MG/3ML) 0.083% IN NEBU: 2.5000 mg | INHALATION_SOLUTION | RESPIRATORY_TRACT | Status: DC | PRN

## 2015-11-03 MED ORDER — SODIUM CHLORIDE 0.9 % IV BOLUS (SEPSIS)
2000.0000 mL | Freq: Once | INTRAVENOUS | Status: AC
  Administered 2015-11-03: 2000 mL via INTRAVENOUS

## 2015-11-03 MED ORDER — PANTOPRAZOLE SODIUM 40 MG IV SOLR
40.0000 mg | INTRAVENOUS | Status: DC
  Administered 2015-11-04 (×2): 40 mg via INTRAVENOUS
  Filled 2015-11-03 (×2): qty 40

## 2015-11-03 MED ORDER — LABETALOL HCL 5 MG/ML IV SOLN: 5.0000 mg | Freq: Four times a day (QID) | INTRAVENOUS | Status: DC | PRN

## 2015-11-03 MED ORDER — ONDANSETRON HCL 4 MG/2ML IJ SOLN
4.0000 mg | Freq: Once | INTRAMUSCULAR | Status: AC
  Administered 2015-11-03: 4 mg via INTRAVENOUS
  Filled 2015-11-03: qty 2

## 2015-11-03 MED ORDER — POTASSIUM CHLORIDE IN NACL 20-0.9 MEQ/L-% IV SOLN
INTRAVENOUS | Status: DC
  Administered 2015-11-04 (×3): via INTRAVENOUS
  Filled 2015-11-03 (×5): qty 1000

## 2015-11-03 MED ORDER — PANTOPRAZOLE SODIUM 40 MG IV SOLR
40.0000 mg | Freq: Once | INTRAVENOUS | Status: AC
  Administered 2015-11-03: 40 mg via INTRAVENOUS
  Filled 2015-11-03: qty 40

## 2015-11-03 MED ORDER — HYDROMORPHONE HCL 1 MG/ML IJ SOLN: 0.5000 mg | INTRAMUSCULAR | Status: DC | PRN

## 2015-11-03 MED ORDER — ACETAMINOPHEN 325 MG PO TABS: 650.0000 mg | ORAL_TABLET | Freq: Four times a day (QID) | ORAL | Status: DC | PRN

## 2015-11-03 NOTE — ED Notes (Addendum)
Pt sent here from [facility] for emesis and positive orthostatic vs.  Emesis x 3 days.  SBP in 70's when standing.  Denies pain.  Recent quadruple bypass.  Pt also c/o recent cough.

## 2015-11-03 NOTE — ED Notes (Signed)
Pt not tolerating NG tube well; states throat burning and he will not keep it through night.

## 2015-11-03 NOTE — ED Provider Notes (Signed)
CSN: [id number]     Arrival date & time 11/03/15  1425 History   First MD Initiated Contact with Patient 11/03/15 1524     Chief Complaint  Patient presents with  . Emesis  . Hypotension   (Consider location/radiation/quality/duration/timing/severity/associated sxs/prior Treatment) HPI 82 y.o. male with a hx of HTN, CAD, HLD, presents to the Emergency Department today from PCP office due to emesis and orthostatic hypotension in office. Pt notes emesis x 3 days. No hematemesis. Does not endorse any abdominal pain or inciting factors. States N/V occurs spontaneously. Does not endorse any URI symptoms. Did note that he was in the waiting room around sick people and thinks he may have "caught a bug" before symptoms began. Notes decrease in PO intake recently. Does say that he has had similar episode of dehydration and admitted due to AKI. No diarrhea. No CP/SOB/ABD pain. No headache. No vision changes. No motor/sensory deficits. No other symptoms noted.   Of note, pt has had recent MI in October and seen at [facility]. Had Quadruple Bypass done. Follow up appointments with Dr. Stanford Breed in [place] recently. Has Echo scheduled on 10-19-15 due to possible Aortic Stenosis.     Past Medical History  Diagnosis Date  . High cholesterol   . Hypertension   . Renal insufficiency   . BPH (benign prostatic hyperplasia)   . GERD (gastroesophageal reflux disease)   . CAD (coronary artery disease)    Past Surgical History  Procedure Laterality Date  . Hernia repair    . Cataract extraction, bilateral    . Coronary artery bypass graft     Family History  Problem Relation Age of Onset  . CAD Father   . Diabetes Sister   . Diabetes Brother    Social History  Substance Use Topics  . Smoking status: Current Every Day Smoker -- 0.50 packs/day    Types: Cigarettes  . Smokeless tobacco: None  . Alcohol Use: 0.0 oz/week    0 Standard drinks or equivalent per week     Comment: Occasional     Review of Systems ROS reviewed and all are negative for acute change except as noted in the HPI.  Allergies  Review of patient's allergies indicates no known allergies.  Home Medications   Prior to Admission medications   Medication Sig Start Date End Date Taking? Authorizing Provider  apixaban (ELIQUIS) 5 MG TABS tablet Take 5 mg by mouth 2 (two) times daily.    Historical Provider, MD  aspirin 81 MG tablet Take 81 mg by mouth daily.    Historical Provider, MD  atorvastatin (LIPITOR) 80 MG tablet Take 1 tablet (80 mg total) by mouth daily. 10/19/15   Lelon Perla, MD  Cetirizine HCl 10 MG CAPS Take 10 mg by mouth daily.    Historical Provider, MD  ciprofloxacin (CIPRO) 500 MG tablet Take 1 tablet (500 mg total) by mouth 2 (two) times daily. 10/27/15   Gregor Hams, MD  doxazosin (CARDURA) 4 MG tablet Take 1 tablet (4 mg total) by mouth daily. 10/27/15   Gregor Hams, MD  ferrous sulfate 325 (65 FE) MG EC tablet Take 325 mg by mouth 3 (three) times daily with meals.    Historical Provider, MD  furosemide (LASIX) 40 MG tablet Take 40 mg by mouth.    Historical Provider, MD  lisinopril (PRINIVIL,ZESTRIL) 10 MG tablet Take 10 mg by mouth daily.    Historical Provider, MD  LORazepam (ATIVAN) 0.5 MG tablet Take  1 tablet (0.5 mg total) by mouth every 8 (eight) hours as needed for anxiety. 10/27/15   Gregor Hams, MD  metoprolol succinate (TOPROL-XL) 50 MG 24 hr tablet Take 50 mg by mouth daily. Take with or immediately following a meal.    Historical Provider, MD  Multiple Vitamin (MULTIVITAMIN) tablet Take 1 tablet by mouth daily.    Historical Provider, MD  omeprazole (PRILOSEC) 20 MG capsule Take 20 mg by mouth daily.    Historical Provider, MD  potassium chloride (KLOR-CON) 20 MEQ packet Take by mouth 2 (two) times daily.    Historical Provider, MD  tamsulosin (FLOMAX) 0.4 MG CAPS capsule Take 1 capsule (0.4 mg total) by mouth daily. 10/27/15   Gregor Hams, MD  varenicline (CHANTIX STARTING  MONTH PAK) 0.5 MG X 11 & 1 MG X 42 tablet Take one 0.5mg  tablet by mouth once daily for 3 days, then increase to one 0.5mg  tablet twice daily for 3 days, then increase to one 1mg  tablet twice daily. 10/27/15   Gregor Hams, MD   BP 119/71 mmHg  Pulse 88  Temp(Src) 98.2 F (36.8 C) (Oral)  Resp 22  Ht 5\' 9"  (1.753 m)  Wt 60.328 kg  BMI 19.63 kg/m2  SpO2 99%   Physical Exam  Constitutional: He is oriented to person, place, and time. Vital signs are normal. He appears well-developed and well-nourished.  Pale conjuctiva  HENT:  Head: Normocephalic and atraumatic.  Eyes: EOM are normal. Pupils are equal, round, and reactive to light.  Neck: Normal range of motion. Neck supple. No tracheal deviation present.  Cardiovascular: Normal rate, regular rhythm, normal heart sounds and intact distal pulses.   No murmur heard. Pulmonary/Chest: Effort normal and breath sounds normal. No respiratory distress. He exhibits no tenderness.  Abdominal: Soft. Normal appearance and bowel sounds are normal. There is no tenderness. There is no rigidity, no rebound, no guarding, no CVA tenderness, no tenderness at McBurney's point and negative Murphy's sign. No hernia. Hernia confirmed negative in the ventral area, confirmed negative in the right inguinal area and confirmed negative in the left inguinal area.  Musculoskeletal: Normal range of motion.  Neurological: He is alert and oriented to person, place, and time. He has normal strength. No cranial nerve deficit or sensory deficit.  Skin: Skin is warm and dry.  Psychiatric: He has a normal mood and affect. His behavior is normal. Thought content normal.  Nursing note and vitals reviewed.  ED Course  Procedures (including critical care time) Labs Review Labs Reviewed  COMPREHENSIVE METABOLIC PANEL - Abnormal; Notable for the following:    Potassium 3.2 (*)    Chloride 84 (*)    CO2 39 (*)    Glucose, Bld 159 (*)    BUN 68 (*)    Creatinine, Ser 3.06 (*)     AST 48 (*)    ALT 71 (*)    GFR calc non Af Amer 18 (*)    GFR calc Af Amer 21 (*)    Anion gap 17 (*)    All other components within normal limits  URINALYSIS, ROUTINE W REFLEX MICROSCOPIC (NOT AT [facility]) - Abnormal; Notable for the following:    APPearance HAZY (*)    All other components within normal limits  CBC WITH DIFFERENTIAL/PLATELET - Abnormal; Notable for the following:    WBC 13.8 (*)    RDW 16.7 (*)    Neutro Abs 10.3 (*)    Monocytes Absolute 1.7 (*)  All other components within normal limits  I-STAT CG4 LACTIC ACID, ED - Abnormal; Notable for the following:    Lactic Acid, Venous 2.10 (*)    All other components within normal limits  URINE CULTURE  I-STAT TROPOININ, ED  I-STAT CG4 LACTIC ACID, ED   Imaging Review Ct Abdomen Pelvis Wo Contrast  11/03/2015  CLINICAL DATA:  Vomiting and constipation for 3 days. EXAM: CT ABDOMEN AND PELVIS WITHOUT CONTRAST TECHNIQUE: Multidetector CT imaging of the abdomen and pelvis was performed following the standard protocol without IV contrast. COMPARISON:  None. FINDINGS: Lower chest and abdominal wall: Status post low abdominal wall hernia repair. Extensive atherosclerosis, including the coronary arteries. No pneumonia or aspiration seen in the lower lungs. Hepatobiliary: Lobulated cyst in the anterior right lobe measuring up to 3 cm.High-density material in the gallbladder without discrete calcified stone. No superimposed inflammatory changes Pancreas: Unremarkable. Spleen: Granulomatous changes Adrenals/Urinary Tract: Negative adrenals. Renal hilar calcifications are elongated and likely vascular. There is smooth symmetric bilateral renal atrophy. No hydronephrosis Unremarkable bladder. Reproductive:Symmetric enlargement of the prostate projecting into the bladder base Stomach/Bowel: Dilated stomach with fluid levels, with normalization of luminal diameter at the level of the pylorus. Semi-solid/ingested material appears to traverse the  pylorus. There is no associated volvulus. No visible perforation, inflammation, bezoar or pneumatosis. Probable appendectomy. Vascular/Lymphatic: Extensive atherosclerosis. No acute vascular abnormality. No mass or adenopathy. Peritoneal: No ascites or pneumoperitoneum. Musculoskeletal: No acute abnormalities. Usual degenerative changes for age. IMPRESSION: Over distended stomach with normalization at the pylorus. No identifiable cause for a gastric outlet obstruction. Electronically Signed   By: Monte Fantasia M.D.   On: 11/03/2015 19:43   Dg Chest 2 View  11/03/2015  CLINICAL DATA:  Cough for 4 days EXAM: CHEST  2 VIEW COMPARISON:  None. FINDINGS: The heart size and mediastinal contours are within normal limits. Both lungs are clear. The visualized skeletal structures are unremarkable. IMPRESSION: No active cardiopulmonary disease. Electronically Signed   By: Inez Catalina M.D.   On: 11/03/2015 15:17   I have personally reviewed and evaluated these images and lab results as part of my medical decision-making.   EKG Interpretation   Date/Time:  Thursday November 03 2015 16:59:56 EDT Ventricular Rate:  79 PR Interval:  160 QRS Duration: 113 QT Interval:  492 QTC Calculation: 564 R Axis:   66 Text Interpretation:  Sinus rhythm Abnormal R-wave progression, late  transition Probable left ventricular hypertrophy Abnormal T, consider  ischemia, lateral leads Prolonged QT interval No previous ECGs available  Confirmed by NGUYEN, EMILY ([id number]) on 11/03/2015 5:07:36 PM     MDM  I have reviewed and evaluated the relevant laboratory values. I have reviewed and evaluated the relevant imaging studies. I personally evaluated and interpreted the relevant EKG. I have reviewed the relevant previous healthcare records.  I obtained HPI from historian. Patient discussed with supervising physician  ED Course: Placed 7F Nasogastric Tube into right nare. Pt tolerated procedure well. Intermittent suction.    Assessment: Pt is a 72yM with hx CAD, HTN, HLD who presents with N/V from PCP office as well as Orthostatic Hypotension. On exam, pt in NAD. Nontoxic/nonseptic appearing. VSS. Afebrile. Lungs CTA. Heart RRR. Abdomen nontender soft. No hernias palpable. Labs show initial elevated lactate of 2.10 with improvement to 1.53 with fluids. WBC 13.8. Potassium 3.2. Creatinine 3.06. CXR unreamarkable. CT ABD/Pelvis showed overly distended abdomen with normalization at pylorus. No identifiable cause for outlet obstruction. NG Tube placed. Given NS bolus in ED. Plan is  to Admit due to AKI and gastric Outlet Obstruction as well as possible scope tomorrow from GI.    Disposition/Plan:  Admit Pt acknowledges and agrees with plan  Supervising Physician Harvel Quale, MD   Final diagnoses:  Gastric outlet obstruction  AKI (acute kidney injury) [facility])        Shary Decamp, PA-C 11/03/15 2123  Harvel Quale, MD 11/04/15 [phone]

## 2015-11-03 NOTE — Progress Notes (Signed)
Bradley Stanley is a 82 y.o. male who presents to [facility] [facility]: Primary Care today for vomiting and dehydration and dizziness. Patient has a three-day history of vomiting. He denies any diarrhea or blood in the stool or vomit. He denies severe abdominal pain. He continues to pass gas per rectum. He had a similar illness a few years ago that resulted in acute kidney injury. He additionally has a recent cardiac history with coronary artery bypass grafting. He denies any fevers or chills. He has not tried any medications for his symptoms yet. He feels lightheaded and dizzy especially when he stands.   Past Medical History  Diagnosis Date  . High cholesterol   . Hypertension   . Renal insufficiency   . BPH (benign prostatic hyperplasia)   . GERD (gastroesophageal reflux disease)   . CAD (coronary artery disease)    Past Surgical History  Procedure Laterality Date  . Hernia repair    . Cataract extraction, bilateral    . Coronary artery bypass graft     Social History  Substance Use Topics  . Smoking status: Current Every Day Smoker  . Smokeless tobacco: Not on file  . Alcohol Use: 0.0 oz/week    0 Standard drinks or equivalent per week     Comment: Occasional   family history includes CAD in his father; Diabetes in his brother and sister.  ROS as above Medications: Current Outpatient Prescriptions  Medication Sig Dispense Refill  . apixaban (ELIQUIS) 5 MG TABS tablet Take 5 mg by mouth 2 (two) times daily.    Marland Kitchen aspirin 81 MG tablet Take 81 mg by mouth daily.    Marland Kitchen atorvastatin (LIPITOR) 80 MG tablet Take 1 tablet (80 mg total) by mouth daily. 90 tablet 3  . Cetirizine HCl 10 MG CAPS Take 10 mg by mouth daily.    . ciprofloxacin (CIPRO) 500 MG tablet Take 1 tablet (500 mg total) by mouth 2 (two) times daily. 20 tablet 0  . doxazosin (CARDURA) 4 MG tablet Take 1 tablet (4 mg total) by mouth daily.  90 tablet 2  . ferrous sulfate 325 (65 FE) MG EC tablet Take 325 mg by mouth 3 (three) times daily with meals.    . furosemide (LASIX) 40 MG tablet Take 40 mg by mouth.    Marland Kitchen lisinopril (PRINIVIL,ZESTRIL) 10 MG tablet Take 10 mg by mouth daily.    Marland Kitchen LORazepam (ATIVAN) 0.5 MG tablet Take 1 tablet (0.5 mg total) by mouth every 8 (eight) hours as needed for anxiety. 30 tablet 0  . metoprolol succinate (TOPROL-XL) 50 MG 24 hr tablet Take 50 mg by mouth daily. Take with or immediately following a meal.    . Multiple Vitamin (MULTIVITAMIN) tablet Take 1 tablet by mouth daily.    Marland Kitchen omeprazole (PRILOSEC) 20 MG capsule Take 20 mg by mouth daily.    . potassium chloride (KLOR-CON) 20 MEQ packet Take by mouth 2 (two) times daily.    . tamsulosin (FLOMAX) 0.4 MG CAPS capsule Take 1 capsule (0.4 mg total) by mouth daily. 90 capsule 3  . varenicline (CHANTIX STARTING MONTH PAK) 0.5 MG X 11 & 1 MG X 42 tablet Take one 0.5mg  tablet by mouth once daily for 3 days, then increase to one 0.5mg  tablet twice daily for 3 days, then increase to one 1mg  tablet twice daily. 53 tablet 0   No current facility-administered medications for this visit.   No Known Allergies  Exam:  BP 108/67 mmHg  Pulse 94  Temp(Src) 97.9 F (36.6 C) (Oral)  Wt 134 lb (60.782 kg)  SpO2 94%  Orthostatic VS for the past 24 hrs:  BP- Lying Pulse- Lying BP- Sitting Pulse- Sitting BP- Standing at 0 minutes Pulse- Standing at 0 minutes  11/03/15 1344 93/62 mmHg 85 113/57 mmHg 87 (!) 77/51 mmHg 101      Gen: Well NAD HEENT: EOMI,  MMM Lungs: Normal work of breathing. CTABL Heart: RRR no MRG Abd: NABS, Soft. Nondistended, Nontender Exts: Brisk capillary refill, warm and well perfused.   No results found for this or any previous visit (from the past 24 hour(s)). No results found.    82 year-old male with vomiting and orthostatic vital signs. History of recent heart disease. Recommend transfer to ER for further evaluation and  management. Patient has a history of acute kidney injury is similar illness in the past therefore is higher risk.

## 2015-11-03 NOTE — ED Notes (Signed)
Pt returned from CT scan; A&Ox3; no signs of distress. Family at bedside has left for evening. No needs; call bell in bed.

## 2015-11-03 NOTE — Patient Instructions (Addendum)
Thank you for coming in today. Go to [facility] ER.

## 2015-11-03 NOTE — H&P (Signed)
History and Physical  Patient Name: Bradley Stanley     [id number]    DOB: [birth year]    DOA: 11/03/2015 Referring physician: Shary Decamp, PA-C PCP: Lynne Leader, MD      Chief Complaint: Vomiting  HPI: Bradley Stanley is a 82 y.o. male with a past medical history significant for CAD s/p CABG x4 in 2016, HTN, PVD, HTN, pAF on apixaban, and GERD who presents with vomiting and AKI.  Patient was in his usual state of health until 3 days ago when he had onset of vomiting.  This was several times per day, NBNB, associated with any food, within 1-5 hours after eating.  He has passed flatus, but no BM in 2 days.  He has heartburn, chills, and mild RUQ pain, crampy in nature.  No recent food exposures.  He has no history of GI disease, had an endoscopy for thrush a few years ago he thinks, without specific findings.  He is a smoker.  He had CABG last October in [facility], has been trying to gain back weight since then.  He takes lisinopril and furosemide, has not been able to keep them down this week.  He went to his PCP today where he was markedly orthostatic, and sent to the ER.  In the ED, he was hemodynamically stable, BP 109/65.  Na 140, K 3.2, chloride low, HCO3 39, Cr 3.0 (no baseline, patient doesn't report bad CKD), mild transaminitis, WBC 13.8K, Hgb normal.  Lactate 2.1, normalized with fluids.  CT of the abdomen and pelvis with contrast showed a distended stomach, with ?semi-solid mass traversing the pylorus, no obvious obstructing mass and no SBO.  The ECG showed QTc 564, unfortunately.    He was administered fluids, NG tube was ready to place, and TRH were asked to evaluate for admission.     Review of Systems:  All other systems negative except as just noted or noted in the history of present illness.  No Known Allergies  Prior to Admission medications   Medication Sig Start Date End Date Taking? Authorizing Provider  apixaban (ELIQUIS) 5 MG TABS tablet Take 5 mg by mouth 2 (two) times  daily.   Yes Historical Provider, MD  aspirin 81 MG tablet Take 81 mg by mouth daily.   Yes Historical Provider, MD  atorvastatin (LIPITOR) 80 MG tablet Take 1 tablet (80 mg total) by mouth daily. 10/19/15  Yes Lelon Perla, MD  Cetirizine HCl 10 MG CAPS Take 10 mg by mouth daily.   Yes Historical Provider, MD  ciprofloxacin (CIPRO) 500 MG tablet Take 1 tablet (500 mg total) by mouth 2 (two) times daily. 10/27/15  Yes Gregor Hams, MD  doxazosin (CARDURA) 4 MG tablet Take 1 tablet (4 mg total) by mouth daily. 10/27/15  Yes Gregor Hams, MD  ferrous sulfate 325 (65 FE) MG EC tablet Take 325 mg by mouth 3 (three) times daily with meals.   Yes Historical Provider, MD  furosemide (LASIX) 40 MG tablet Take 40 mg by mouth.   Yes Historical Provider, MD  lisinopril (PRINIVIL,ZESTRIL) 10 MG tablet Take 10 mg by mouth daily.   Yes Historical Provider, MD  LORazepam (ATIVAN) 0.5 MG tablet Take 1 tablet (0.5 mg total) by mouth every 8 (eight) hours as needed for anxiety. 10/27/15  Yes Gregor Hams, MD  metoprolol succinate (TOPROL-XL) 50 MG 24 hr tablet Take 50 mg by mouth daily. Take with or immediately following a meal.   Yes Historical  Provider, MD  Multiple Vitamin (MULTIVITAMIN) tablet Take 1 tablet by mouth daily.   Yes Historical Provider, MD  omeprazole (PRILOSEC) 20 MG capsule Take 20 mg by mouth daily.   Yes Historical Provider, MD  potassium chloride (KLOR-CON) 20 MEQ packet Take by mouth 2 (two) times daily.   Yes Historical Provider, MD  tamsulosin (FLOMAX) 0.4 MG CAPS capsule Take 1 capsule (0.4 mg total) by mouth daily. 10/27/15  Yes Gregor Hams, MD  varenicline (CHANTIX STARTING MONTH PAK) 0.5 MG X 11 & 1 MG X 42 tablet Take one 0.5mg  tablet by mouth once daily for 3 days, then increase to one 0.5mg  tablet twice daily for 3 days, then increase to one 1mg  tablet twice daily. Patient not taking: Reported on 11/03/2015 10/27/15   Gregor Hams, MD    Past Medical History  Diagnosis Date  . High  cholesterol   . Hypertension   . Renal insufficiency   . BPH (benign prostatic hyperplasia)   . GERD (gastroesophageal reflux disease)   . CAD (coronary artery disease)     Past Surgical History  Procedure Laterality Date  . Hernia repair    . Cataract extraction, bilateral    . Coronary artery bypass graft      Family history: family history includes CAD in his father; Diabetes in his brother and sister.  Social History: Patient lives with his wfie.  He is from [place] originally. He is an active smoker. He used to work in Database administrator. He is independent with all IADLs and ADLs.  He does not use a cane or walker.     Physical Exam: BP 122/66 mmHg  Pulse 72  Temp(Src) 98.2 F (36.8 C) (Oral)  Resp 15  Ht 5\' 9"  (1.753 m)  Wt 60.328 kg (133 lb)  BMI 19.63 kg/m2  SpO2 97% General appearance: Elderly thin adult male, alert and in no acute distress.   Eyes: Anicteric, conjunctiva pink, lids and lashes normal.    Wearing sunglasses inside. ENT: No nasal deformity, discharge, or epistaxis.  OP moist without lesions.   Lymph: No cervical or supraclavicular lymphadenopathy. Skin: Warm and dry.  No jaundice.  No suspicious rashes or lesions. Cardiac: RRR, nl S1-S2, no murmurs appreciated on my exam.  Capillary refill is brisk.  Neck veins flat.  No LE edema.  Radial and DP pulses 2+ and symmetric. Respiratory: Normal respiratory rate and rhythm.  Diffuse wheezes. Abdomen: Abdomen soft without rigidity.  Mild RUQ TTP without guarding.  No epigastric tenderness or mass. No ascites, distension.   MSK: No deformities or effusions. Neuro: Sensorium intact and responding to questions, attention normal.  Speech is fluent.  Moves all extremities equally and with normal coordination.  Cranial nerves normal.  Psych: Behavior appropriate.  Affect normal.  No evidence of aural or visual hallucinations or delusions.       Labs on Admission:  The metabolic panel shows Hypokalemia,  elevated bicarbonate, elevated creatinine. Mild transaminases. Lactate elevated to 2.1 mmol per liter. Troponin negative The complete blood count shows leukocytosis without anemia or thrombocytopenia.   Radiological Exams on Admission: Personally reviewed: Dg Chest 2 View 11/03/2015  No acute disease   Ct Abdomen Pelvis Wo Contrast 11/03/2015 Stomach/Bowel: Dilated stomach with fluid levels, with normalization of luminal diameter at the level of the pylorus. Semi-solid/ingested material appears to traverse the pylorus. There is no associated volvulus. No visible perforation, inflammation, bezoar or pneumatosis. Probable appendectomy.  IMPRESSION: Over distended stomach with normalization at  the pylorus. No identifiable cause for a gastric outlet obstruction.    EKG: Independently reviewed. Rate 79, QTC 564, no ST changes, sinus rhythm.    Assessment/Plan 1. N/V and possible gastric outlet obstruction:  This is new.  Also possibly just viral GE.  No SBO evident, or abdominal pain.   -Place NG tube -Lorazepam for nausea -MIVF and NPO except ice chips -Consult to GI, appreciate cares   2. Possible AKI:  This is new.  If this is AKI, which he had in the past with an episodes of salmonella/vomiting/diarrhea, then this is likely pre-renal, with contribution from furosemide and lisinopril.   -Obtain outside records for baseline Cr -Fluids, and trend BMP -Check urinalysis for hematuria/pyuria and FeNA  3. Elevated lactic acid:  Presumed hypovolemic.  Already normalized.  4. Prolonged QT:  -Will avoid traditional anti-emetics for now and cipro  5. Prostatitis and BPH:  Started therapy this week.  I will discontinue ciprofloxacin given QTc, await urine culture from PCP -Hold tamsulosin while NPO  6. Hypokalemia:  -Fulids with K and trend BMP  7. CAD and HTN:  -Hold aspirin, statin, doxazosin, lisinopril, and furosemide while NPO -Labetalol PRN for hypertension  8.  pAF: CHADS2Vasc 4, on apixaban.  Labetalol PRN as above.  Will hold apixaban while NPO, given that he is on telemetry.   -If arrhythmia noted, could start Lovenox bridging.  9. Smoking: Smoking cessation was recommended.     DVT PPx: Heparin subQ Diet: NPO Consultants: GI Code Status: FULL Family Communication: None present  Medical decision making: What exists of the patient's previous chart was reviewed in depth, outisde records from his CABG last October were requested and the case was discussed Shary Decamp, PA-C. Patient seen 9:00 PM on 11/03/2015.  Disposition Plan:  I recommend admission to telemetry given QTc and ondansetron given in ER and ciprofloxacin that he takes.  Clinical condition: stable.  Anticipate NG tube and GI consultation.  If symptoms resolve with conservative mgmt, may be able to be discharged within 48 hours, unclear at this time.  Regarding prostatitis, I recommend discontinuing treatment until culture results return, given QT prolongation concerns with Cipro and Bactrim.      Edwin Dada [facility] Pager [phone]

## 2015-11-04 DIAGNOSIS — A084 Viral intestinal infection, unspecified: Secondary | ICD-10-CM | POA: Diagnosis present

## 2015-11-04 DIAGNOSIS — N184 Chronic kidney disease, stage 4 (severe): Secondary | ICD-10-CM

## 2015-11-04 DIAGNOSIS — I739 Peripheral vascular disease, unspecified: Secondary | ICD-10-CM

## 2015-11-04 DIAGNOSIS — K5900 Constipation, unspecified: Secondary | ICD-10-CM | POA: Diagnosis present

## 2015-11-04 DIAGNOSIS — E876 Hypokalemia: Secondary | ICD-10-CM

## 2015-11-04 DIAGNOSIS — F17200 Nicotine dependence, unspecified, uncomplicated: Secondary | ICD-10-CM | POA: Diagnosis present

## 2015-11-04 DIAGNOSIS — Z79899 Other long term (current) drug therapy: Secondary | ICD-10-CM | POA: Diagnosis not present

## 2015-11-04 DIAGNOSIS — K210 Gastro-esophageal reflux disease with esophagitis: Secondary | ICD-10-CM | POA: Diagnosis present

## 2015-11-04 DIAGNOSIS — E785 Hyperlipidemia, unspecified: Secondary | ICD-10-CM

## 2015-11-04 DIAGNOSIS — Z961 Presence of intraocular lens: Secondary | ICD-10-CM | POA: Diagnosis present

## 2015-11-04 DIAGNOSIS — R112 Nausea with vomiting, unspecified: Secondary | ICD-10-CM | POA: Diagnosis present

## 2015-11-04 DIAGNOSIS — R933 Abnormal findings on diagnostic imaging of other parts of digestive tract: Secondary | ICD-10-CM | POA: Diagnosis not present

## 2015-11-04 DIAGNOSIS — I2510 Atherosclerotic heart disease of native coronary artery without angina pectoris: Secondary | ICD-10-CM | POA: Diagnosis present

## 2015-11-04 DIAGNOSIS — E43 Unspecified severe protein-calorie malnutrition: Secondary | ICD-10-CM | POA: Insufficient documentation

## 2015-11-04 DIAGNOSIS — Z681 Body mass index (BMI) 19 or less, adult: Secondary | ICD-10-CM | POA: Diagnosis not present

## 2015-11-04 DIAGNOSIS — R1110 Vomiting, unspecified: Secondary | ICD-10-CM | POA: Diagnosis not present

## 2015-11-04 DIAGNOSIS — Z9842 Cataract extraction status, left eye: Secondary | ICD-10-CM | POA: Diagnosis not present

## 2015-11-04 DIAGNOSIS — I10 Essential (primary) hypertension: Secondary | ICD-10-CM | POA: Diagnosis not present

## 2015-11-04 DIAGNOSIS — I480 Paroxysmal atrial fibrillation: Secondary | ICD-10-CM | POA: Diagnosis present

## 2015-11-04 DIAGNOSIS — Z7982 Long term (current) use of aspirin: Secondary | ICD-10-CM | POA: Diagnosis not present

## 2015-11-04 DIAGNOSIS — E870 Hyperosmolality and hypernatremia: Secondary | ICD-10-CM | POA: Diagnosis present

## 2015-11-04 DIAGNOSIS — N400 Enlarged prostate without lower urinary tract symptoms: Secondary | ICD-10-CM | POA: Diagnosis present

## 2015-11-04 DIAGNOSIS — F419 Anxiety disorder, unspecified: Secondary | ICD-10-CM | POA: Diagnosis present

## 2015-11-04 DIAGNOSIS — I129 Hypertensive chronic kidney disease with stage 1 through stage 4 chronic kidney disease, or unspecified chronic kidney disease: Secondary | ICD-10-CM | POA: Diagnosis present

## 2015-11-04 DIAGNOSIS — K209 Esophagitis, unspecified: Secondary | ICD-10-CM | POA: Diagnosis not present

## 2015-11-04 DIAGNOSIS — Z9841 Cataract extraction status, right eye: Secondary | ICD-10-CM | POA: Diagnosis not present

## 2015-11-04 DIAGNOSIS — N179 Acute kidney failure, unspecified: Secondary | ICD-10-CM | POA: Diagnosis present

## 2015-11-04 DIAGNOSIS — K2980 Duodenitis without bleeding: Secondary | ICD-10-CM | POA: Diagnosis present

## 2015-11-04 DIAGNOSIS — K259 Gastric ulcer, unspecified as acute or chronic, without hemorrhage or perforation: Secondary | ICD-10-CM | POA: Diagnosis present

## 2015-11-04 DIAGNOSIS — N183 Chronic kidney disease, stage 3 unspecified: Secondary | ICD-10-CM | POA: Diagnosis present

## 2015-11-04 DIAGNOSIS — K311 Adult hypertrophic pyloric stenosis: Secondary | ICD-10-CM | POA: Diagnosis present

## 2015-11-04 DIAGNOSIS — D72829 Elevated white blood cell count, unspecified: Secondary | ICD-10-CM | POA: Diagnosis present

## 2015-11-04 DIAGNOSIS — E860 Dehydration: Secondary | ICD-10-CM | POA: Diagnosis present

## 2015-11-04 DIAGNOSIS — I252 Old myocardial infarction: Secondary | ICD-10-CM | POA: Diagnosis not present

## 2015-11-04 DIAGNOSIS — Z7901 Long term (current) use of anticoagulants: Secondary | ICD-10-CM | POA: Diagnosis not present

## 2015-11-04 DIAGNOSIS — E7800 Pure hypercholesterolemia, unspecified: Secondary | ICD-10-CM | POA: Diagnosis present

## 2015-11-04 DIAGNOSIS — Z951 Presence of aortocoronary bypass graft: Secondary | ICD-10-CM | POA: Diagnosis not present

## 2015-11-04 DIAGNOSIS — K219 Gastro-esophageal reflux disease without esophagitis: Secondary | ICD-10-CM | POA: Diagnosis not present

## 2015-11-04 LAB — COMPREHENSIVE METABOLIC PANEL
ALBUMIN: 3.3 g/dL — AB (ref 3.5–5.0)
ALK PHOS: 60 U/L (ref 38–126)
ALT: 56 U/L (ref 17–63)
AST: 38 U/L (ref 15–41)
Anion gap: 14 (ref 5–15)
BUN: 62 mg/dL — AB (ref 6–20)
CHLORIDE: 92 mmol/L — AB (ref 101–111)
CO2: 38 mmol/L — AB (ref 22–32)
CREATININE: 2.39 mg/dL — AB (ref 0.61–1.24)
Calcium: 8.4 mg/dL — ABNORMAL LOW (ref 8.9–10.3)
GFR calc non Af Amer: 24 mL/min — ABNORMAL LOW (ref >60)
GFR, EST AFRICAN AMERICAN: 28 mL/min — AB (ref >60)
GLUCOSE: 116 mg/dL — AB (ref 65–99)
Potassium: 2.8 mmol/L — ABNORMAL LOW (ref 3.5–5.1)
SODIUM: 144 mmol/L (ref 135–145)
Total Bilirubin: 0.5 mg/dL (ref 0.3–1.2)
Total Protein: 6.2 g/dL — ABNORMAL LOW (ref 6.5–8.1)

## 2015-11-04 LAB — CBC
HCT: 34.6 % — ABNORMAL LOW (ref 39.0–52.0)
Hemoglobin: 11.1 g/dL — ABNORMAL LOW (ref 13.0–17.0)
MCH: 29.4 pg (ref 26.0–34.0)
MCHC: 32.1 g/dL (ref 30.0–36.0)
MCV: 91.5 fL (ref 78.0–100.0)
PLATELETS: 265 10*3/uL (ref 150–400)
RBC: 3.78 MIL/uL — AB (ref 4.22–5.81)
RDW: 16.8 % — ABNORMAL HIGH (ref 11.5–15.5)
WBC: 11.7 10*3/uL — ABNORMAL HIGH (ref 4.0–10.5)

## 2015-11-04 LAB — CREATININE, SERUM
Creatinine, Ser: 2.84 mg/dL — ABNORMAL HIGH (ref 0.61–1.24)
GFR calc non Af Amer: 20 mL/min — ABNORMAL LOW (ref >60)
GFR, EST AFRICAN AMERICAN: 23 mL/min — AB (ref >60)

## 2015-11-04 MED ORDER — APIXABAN 2.5 MG PO TABS
2.5000 mg | ORAL_TABLET | Freq: Two times a day (BID) | ORAL | Status: DC
  Administered 2015-11-04 – 2015-11-05 (×2): 2.5 mg via ORAL
  Filled 2015-11-04 (×3): qty 1

## 2015-11-04 MED ORDER — CALCIUM CARBONATE ANTACID 500 MG PO CHEW
1.0000 | CHEWABLE_TABLET | Freq: Four times a day (QID) | ORAL | Status: DC | PRN
  Administered 2015-11-04 – 2015-11-05 (×3): 200 mg via ORAL
  Filled 2015-11-04 (×5): qty 1

## 2015-11-04 MED ORDER — ONDANSETRON HCL 4 MG/2ML IJ SOLN
4.0000 mg | Freq: Four times a day (QID) | INTRAMUSCULAR | Status: DC | PRN
  Administered 2015-11-04: 4 mg via INTRAVENOUS

## 2015-11-04 MED ORDER — METOPROLOL SUCCINATE ER 50 MG PO TB24
50.0000 mg | ORAL_TABLET | Freq: Every day | ORAL | Status: DC
  Administered 2015-11-04 – 2015-11-08 (×4): 50 mg via ORAL
  Filled 2015-11-04 (×5): qty 1

## 2015-11-04 MED ORDER — APIXABAN 5 MG PO TABS
5.0000 mg | ORAL_TABLET | Freq: Two times a day (BID) | ORAL | Status: DC
  Administered 2015-11-04: 5 mg via ORAL

## 2015-11-04 MED ORDER — ATORVASTATIN CALCIUM 80 MG PO TABS
80.0000 mg | ORAL_TABLET | Freq: Every day | ORAL | Status: DC
  Administered 2015-11-04 – 2015-11-05 (×2): 80 mg via ORAL
  Filled 2015-11-04 (×2): qty 1

## 2015-11-04 MED ORDER — POTASSIUM CHLORIDE 10 MEQ/100ML IV SOLN
10.0000 meq | INTRAVENOUS | Status: AC
  Administered 2015-11-04 (×3): 10 meq via INTRAVENOUS
  Filled 2015-11-04 (×3): qty 100

## 2015-11-04 NOTE — Care Management Obs Status (Signed)
MEDICARE OBSERVATION STATUS NOTIFICATION   Patient Details  Name: Bradley Stanley MRN: [id number] Date of Birth: October 02, 1932   Medicare Observation Status Notification Given:  Yes    Sharin Mons, RN 11/04/2015, 2:13 PM

## 2015-11-04 NOTE — Progress Notes (Addendum)
Patient ID: Bradley Stanley, male   DOB: 05-Aug-1932, 82 y.o.   MRN: [id number]  PROGRESS NOTE    Mori Husein  [id number] DOB: 10-01-1932 DOA: 11/03/2015  PCP: Lynne Leader, MD  Outpatient Specialists:   Brief Narrative:  82 y.o. male with a past medical history significant for CAD s/p CABG x4 in 2016, HTN, PVD, HTN, atrial fibrillation on apixaban, and GERD who presented to [facility] ED with non bloody vomiting and poor po intake for past 3 days prior to this admission. No fevers, no diarrhea. He had mild abdominal pain on the right side which has improved since admission.   He was hemodynamically stable on admission. CT of the abdomen and pelvis with contrast showed a distended stomach, with ?semi-solid ingested material traversing the pylorus, no obvious obstructing mass and no SBO.He had HG tube placed but he removed it himself due to discomfort.   Assessment & Plan:   Principal Problem:   Nausea and vomiting - Likely viral gastroenteritis - No gastric outlet obstruction seen on CT scan - Pt removed NG tube since it was very uncomfortable to him - Continue supportive care with IV fluids  Active Problems:   Essential hypertension - BP now stable 127/58    Peripheral vascular disease (HCC) - Resumed apixaban    Hypokalemia - Due to GI losses, lasix - Supplemented through IV fluids    Leukocytosis - Likely due to viral gastroenteritis - Improving    CKD (chronic kidney disease) stage 4, GFR 15-29 ml/min (HCC) - No previous values in EPIC for comparison but considering anemia pt probably has degree of CKD - Stopped lisinopril and lasix which could have contributed to renal insufficiency - Cr 3.06 on admission --> 2.39    Paroxysmal atrial fibrillation (HCC) - CHADs vasc score 4 (age, gender, hypertension, PVD) - Continue apixaban - Continue metoprolol     Dyslipidemia - Continue atorvastatin 80 mg daily   DVT prophylaxis: Apixaban  Code Status: full code Family  Communication: no family at the bedside   Disposition Plan: home once he tolerates regular diet    Consultants:   None   Procedures:   None   Antimicrobials:   None     Subjective: Feels better, no nausea or vomiting.   Objective: Filed Vitals:   11/03/15 2200 11/03/15 2238 11/04/15 0608 11/04/15 1355  BP: 139/72 123/56 108/50 127/58  Pulse: 75 73 69 62  Temp:  98.5 F (36.9 C) 97.7 F (36.5 C) 98.1 F (36.7 C)  TempSrc:  Oral Oral   Resp: 20 18 18 16   Height:   5\' 9"  (1.753 m)   Weight:   59.421 kg (131 lb)   SpO2: 97% 90% 98% 98%    Intake/Output Summary (Last 24 hours) at 11/04/15 1408 Last data filed at 11/04/15 1033  Gross per 24 hour  Intake      0 ml  Output   1350 ml  Net  -1350 ml   Filed Weights   11/03/15 1436 11/04/15 0608  Weight: 60.328 kg (133 lb) 59.421 kg (131 lb)    Examination:  General exam: Appears calm and comfortable  Respiratory system: Clear to auscultation. Respiratory effort normal. Cardiovascular system: S1 & S2 heard, RRR. No JVD, murmurs, rubs, gallops or clicks. No pedal edema. Gastrointestinal system: Abdomen is nondistended, soft and nontender. No organomegaly or masses felt. Normal bowel sounds heard. Central nervous system: Alert and oriented. No focal neurological deficits. Extremities: Symmetric 5 x 5 power. Skin: No rashes,  lesions or ulcers Psychiatry: Judgement and insight appear normal. Mood & affect appropriate.   Data Reviewed: I have personally reviewed following labs and imaging studies  CBC:  Recent Labs Lab 11/03/15 1448 11/03/15 2330 11/04/15 0358  WBC 13.8* 13.9* 11.7*  NEUTROABS 10.3*  --   --   HGB 13.5 12.0* 11.1*  HCT 41.0 36.9* 34.6*  MCV 90.7 91.6 91.5  PLT 335 295 [id number]   Basic Metabolic Panel:  Recent Labs Lab 11/03/15 1448 11/03/15 2330 11/04/15 0358  NA 140  --  144  K 3.2*  --  2.8*  CL 84*  --  92*  CO2 39*  --  38*  GLUCOSE 159*  --  116*  BUN 68*  --  62*  CREATININE  3.06* 2.84* 2.39*  CALCIUM 9.4  --  8.4*   GFR: Estimated Creatinine Clearance: 20.7 mL/min (by C-G formula based on Cr of 2.39). Liver Function Tests:  Recent Labs Lab 11/03/15 1448 11/04/15 0358  AST 48* 38  ALT 71* 56  ALKPHOS 79 60  BILITOT 0.6 0.5  PROT 7.3 6.2*  ALBUMIN 4.2 3.3*   No results for input(s): LIPASE, AMYLASE in the last 168 hours. No results for input(s): AMMONIA in the last 168 hours. Coagulation Profile: No results for input(s): INR, PROTIME in the last 168 hours. Cardiac Enzymes: No results for input(s): CKTOTAL, CKMB, CKMBINDEX, TROPONINI in the last 168 hours. BNP (last 3 results) No results for input(s): PROBNP in the last 8760 hours. HbA1C: No results for input(s): HGBA1C in the last 72 hours. CBG: No results for input(s): GLUCAP in the last 168 hours. Lipid Profile: No results for input(s): CHOL, HDL, LDLCALC, TRIG, CHOLHDL, LDLDIRECT in the last 72 hours. Thyroid Function Tests: No results for input(s): TSH, T4TOTAL, FREET4, T3FREE, THYROIDAB in the last 72 hours. Anemia Panel: No results for input(s): VITAMINB12, FOLATE, FERRITIN, TIBC, IRON, RETICCTPCT in the last 72 hours. Urine analysis:    Component Value Date/Time   COLORURINE YELLOW 11/03/2015 2251   APPEARANCEUR CLEAR 11/03/2015 2251   LABSPEC 1.016 11/03/2015 2251   PHURINE 6.5 11/03/2015 2251   GLUCOSEU NEGATIVE 11/03/2015 2251   HGBUR NEGATIVE 11/03/2015 2251   BILIRUBINUR NEGATIVE 11/03/2015 2251   BILIRUBINUR neg 10/27/2015 1202   KETONESUR NEGATIVE 11/03/2015 2251   PROTEINUR NEGATIVE 11/03/2015 2251   PROTEINUR neg 10/27/2015 1202   UROBILINOGEN 0.2 10/27/2015 1202   NITRITE NEGATIVE 11/03/2015 2251   NITRITE neg 10/27/2015 1202   LEUKOCYTESUR NEGATIVE 11/03/2015 2251   Sepsis Labs: @LABRCNTIP (procalcitonin:4,lacticidven:4)  ) Recent Results (from the past 240 hour(s))  Urine Culture     Status: None   Collection Time: 10/27/15 12:02 PM  Result Value Ref Range  Status   Colony Count NO GROWTH  Final   Organism ID, Bacteria NO GROWTH  Final  Urine culture     Status: None (Preliminary result)   Collection Time: 11/03/15  3:46 PM  Result Value Ref Range Status   Specimen Description URINE, CLEAN CATCH  Final   Special Requests NONE  Final   Culture NO GROWTH < 24 HOURS  Final   Report Status PENDING  Incomplete      Radiology Studies: Ct Abdomen Pelvis Wo Contrast 11/03/2015   Over distended stomach with normalization at the pylorus. No identifiable cause for a gastric outlet obstruction.   Dg Chest 2 View 11/03/2015   No active cardiopulmonary disease.   Scheduled Meds: . pantoprazole (PROTONIX) IV  40 mg Intravenous Q24H  .  potassium chloride  10 mEq Intravenous Q1 Hr x 3  . sodium chloride flush  3 mL Intravenous Q12H   Continuous Infusions: . 0.9 % NaCl with KCl 20 mEq / L 50 mL/hr at 11/04/15 1251     LOS: 1 day    Time spent: 25 minutes   Leisa Lenz, MD [facility] Pager [phone]  If 7PM-7AM, please contact night-coverage [email or web address] Password [facility] 11/04/2015, 2:08 PM

## 2015-11-04 NOTE — Progress Notes (Signed)
Initial Nutrition Assessment  DOCUMENTATION CODES:   Severe malnutrition in context of chronic illness  INTERVENTION:   -RD will follow for diet advancement -Once diet is advanced, provide Ensure Enlive po BID, each supplement provides 350 kcal and 20 grams of protein  NUTRITION DIAGNOSIS:   Malnutrition related to chronic illness as evidenced by severe depletion of body fat, severe depletion of muscle mass.  GOAL:   Patient will meet greater than or equal to 90% of their needs  MONITOR:   PO intake, Supplement acceptance, Diet advancement, Labs, Weight trends, Skin, I & O's  REASON FOR ASSESSMENT:   Malnutrition Screening Tool    ASSESSMENT:   Bradley Stanley is a 82 y.o. male with a past medical history significant for CAD s/p CABG x4 in 2016, HTN, PVD, HTN, pAF on apixaban, and GERD who presents with vomiting and AKI.  Pt admitted with nausea and vomiting related to possible gastric outlet obstruction.   Spoke with pt at bedside. He reveals a general decline in health over the past 6 months, as he has "been in and out of hospitals". He shares that he lost about 20# after undergoing heart surgery, but has made efforts to gain some of the weight back by consuming Ensure and malted milk shakes. He reveals he is "never a big eater; I just eat to survive". He shares this RD that he has undergone some depression over the past 3 months, due to the need to relocate to [facility] to be closer to his family, which he also attributes to his weight loss. More recently, pt reports he was unable to keep foods and liquids down for the past 3 days.   Pt reports UBW is around 158#.   Nutrition-Focused physical exam completed. Findings are moderate to severe fat depletion, moderate to severe muscle depletion, and no edema. Pt describes himself as very active PTA, doing all of his yard work.   Pt is amenable to continue Ensure supplements with diet advancement. Encouraged continued use of Ensure  and milkshakes at home to optimize nutritional status.   Case discussed with RN. She confirms pt refused NGT placement, but work-up revealed no obstruction.   Labs reviewed: K: 2.8 (on IV supplementation).  Diet Order:  Diet NPO time specified Except for: Ice Chips  Skin:  Reviewed, no issues  Last BM:  11/01/15  Height:   Ht Readings from Last 1 Encounters:  11/04/15 5\' 9"  (1.753 m)    Weight:   Wt Readings from Last 1 Encounters:  11/04/15 131 lb (59.421 kg)    Ideal Body Weight:  72.7 kg  BMI:  Body mass index is 19.34 kg/(m^2).  Estimated Nutritional Needs:   Kcal:  1600-1800  Protein:  75-90 grams  Fluid:  1.6-1.8 L  EDUCATION NEEDS:   Education needs addressed  Bradley Stanley A. Jimmye Norman, RD, LDN, CDE Pager: [phone] After hours Pager: [phone]

## 2015-11-04 NOTE — Progress Notes (Signed)
Paged on call provider in regards to patient pulling out NGT. Refuses to have replaced.

## 2015-11-05 DIAGNOSIS — K219 Gastro-esophageal reflux disease without esophagitis: Secondary | ICD-10-CM

## 2015-11-05 DIAGNOSIS — R933 Abnormal findings on diagnostic imaging of other parts of digestive tract: Secondary | ICD-10-CM

## 2015-11-05 DIAGNOSIS — R112 Nausea with vomiting, unspecified: Secondary | ICD-10-CM

## 2015-11-05 LAB — CBC
HCT: 37.4 % — ABNORMAL LOW (ref 39.0–52.0)
HEMOGLOBIN: 11.8 g/dL — AB (ref 13.0–17.0)
MCH: 29.7 pg (ref 26.0–34.0)
MCHC: 31.6 g/dL (ref 30.0–36.0)
MCV: 94.2 fL (ref 78.0–100.0)
PLATELETS: 252 10*3/uL (ref 150–400)
RBC: 3.97 MIL/uL — AB (ref 4.22–5.81)
RDW: 16.5 % — ABNORMAL HIGH (ref 11.5–15.5)
WBC: 11.4 10*3/uL — ABNORMAL HIGH (ref 4.0–10.5)

## 2015-11-05 LAB — BASIC METABOLIC PANEL
ANION GAP: 13 (ref 5–15)
BUN: 51 mg/dL — ABNORMAL HIGH (ref 6–20)
CALCIUM: 8.8 mg/dL — AB (ref 8.9–10.3)
CO2: 37 mmol/L — ABNORMAL HIGH (ref 22–32)
CREATININE: 2.08 mg/dL — AB (ref 0.61–1.24)
Chloride: 97 mmol/L — ABNORMAL LOW (ref 101–111)
GFR, EST AFRICAN AMERICAN: 33 mL/min — AB (ref >60)
GFR, EST NON AFRICAN AMERICAN: 28 mL/min — AB (ref >60)
Glucose, Bld: 114 mg/dL — ABNORMAL HIGH (ref 65–99)
Potassium: 3.5 mmol/L (ref 3.5–5.1)
Sodium: 147 mmol/L — ABNORMAL HIGH (ref 135–145)

## 2015-11-05 LAB — URINE CULTURE: Culture: NO GROWTH

## 2015-11-05 MED ORDER — PANTOPRAZOLE SODIUM 40 MG IV SOLR
40.0000 mg | Freq: Two times a day (BID) | INTRAVENOUS | Status: DC
  Administered 2015-11-05 – 2015-11-08 (×6): 40 mg via INTRAVENOUS
  Filled 2015-11-05 (×6): qty 40

## 2015-11-05 MED ORDER — SODIUM CHLORIDE 0.45 % IV SOLN
INTRAVENOUS | Status: DC
  Administered 2015-11-05 – 2015-11-08 (×5): via INTRAVENOUS
  Filled 2015-11-05 (×7): qty 1000

## 2015-11-05 NOTE — Progress Notes (Addendum)
Patient ID: Bradley Stanley, male   DOB: [birth year], 82 y.o.   MRN: [id number]  PROGRESS NOTE    Bradley Stanley  [id number] DOB: 10-06-1932 DOA: 11/03/2015  PCP: Lynne Leader, MD  Outpatient Specialists:   Brief Narrative:  82 y.o. male with a past medical history significant for CAD s/p CABG x4 in 2016, HTN, PVD, HTN, atrial fibrillation on apixaban, and GERD who presented to [facility] ED with non bloody vomiting and poor po intake for past 3 days prior to this admission. No fevers, no diarrhea. He had mild abdominal pain on the right side which has improved since admission.   He was hemodynamically stable on admission. CT of the abdomen and pelvis with contrast showed a distended stomach, with ?semi-solid ingested material traversing the pylorus, no obvious obstructing mass and no SBO.He had HG tube placed but he removed it himself due to discomfort.   Assessment & Plan:   Principal Problem:   Nausea and vomiting - Unclear etiology - No gastric outlet obstruction seen on CT scan - Continue protonix IV daily  - Pt removed NG tube since it was very uncomfortable to him  - Appreciate GI consult and recommendation  - Continue supportive care with IV fluids  Active Problems:   Essential hypertension - BP stable    Peripheral vascular disease (HCC) - Continue  apixaban    Hypokalemia - Due to GI losses, lasix - Supplemented through IV fluids - Now WNL    Hypernatremia  - Due to dehydration - Change IV fluids to 1/2 NS    Leukocytosis - Likely reactive - Pt not on any abx    CKD (chronic kidney disease) stage 4, GFR 15-29 ml/min (HCC) - No previous values in EPIC for comparison but considering anemia pt probably has degree of CKD - Stopped lisinopril and lasix which could have contributed to renal insufficiency - Cr 3.06 --> 2.39 --> 2.08, improving with fluids     Paroxysmal atrial fibrillation (HCC) - CHADs vasc score 4 (age, gender, hypertension, PVD) - On Anticoagulation with  apixaban - Rate controlled with metoprolol    Dyslipidemia - Continue atorvastatin 80 mg daily once he resumes regular diet    DVT prophylaxis: Apixaban  Code Status: full code Family Communication: no family at the bedside   Disposition Plan: home once he is stable, he needs to be seen by GI and potentially have EGD to figure out what's going on.   Consultants:   Gastroenterology    Procedures:   None   Antimicrobials:   None     Subjective: Cannot tolerates anything po, haas nausea and vomiting.  Objective: Filed Vitals:   11/04/15 0608 11/04/15 1355 11/04/15 2137 11/05/15 0521  BP: 108/50 127/58 133/53 123/99  Pulse: 69 62 72 75  Temp: 97.7 F (36.5 C) 98.1 F (36.7 C) 97.6 F (36.4 C) 97.9 F (36.6 C)  TempSrc: Oral  Oral Oral  Resp: 18 16 18 18   Height: 5\' 9"  (1.753 m)     Weight: 59.421 kg (131 lb)     SpO2: 98% 98% 95% 97%    Intake/Output Summary (Last 24 hours) at 11/05/15 1148 Last data filed at 11/05/15 1001  Gross per 24 hour  Intake 2071.67 ml  Output   1050 ml  Net 1021.67 ml   Filed Weights   11/03/15 1436 11/04/15 0608  Weight: 60.328 kg (133 lb) 59.421 kg (131 lb)    Examination:  General exam: No distress  Respiratory system: Bilateral air  entry, no wheezing  Cardiovascular system: S1 & S2 appreciated, Rate controlled. No murmurs Gastrointestinal system: Abdomen nondistended, nontender. Normal bowel sounds heard. Central nervous system: Alert. No focal neurological deficits. Extremities: Symmetric 5 x 5 power.No edema. Skin: No rashes, warm, dry skin  Psychiatry: Judgement and insight normal. Mood & affect good.   Data Reviewed: I have personally reviewed following labs and imaging studies  CBC:  Recent Labs Lab 11/03/15 1448 11/03/15 2330 11/04/15 0358 11/05/15 0545  WBC 13.8* 13.9* 11.7* 11.4*  NEUTROABS 10.3*  --   --   --   HGB 13.5 12.0* 11.1* 11.8*  HCT 41.0 36.9* 34.6* 37.4*  MCV 90.7 91.6 91.5 94.2  PLT 335  295 265 [id number]   Basic Metabolic Panel:  Recent Labs Lab 11/03/15 1448 11/03/15 2330 11/04/15 0358 11/05/15 0545  NA 140  --  144 147*  K 3.2*  --  2.8* 3.5  CL 84*  --  92* 97*  CO2 39*  --  38* 37*  GLUCOSE 159*  --  116* 114*  BUN 68*  --  62* 51*  CREATININE 3.06* 2.84* 2.39* 2.08*  CALCIUM 9.4  --  8.4* 8.8*   GFR: Estimated Creatinine Clearance: 23.8 mL/min (by C-G formula based on Cr of 2.08). Liver Function Tests:  Recent Labs Lab 11/03/15 1448 11/04/15 0358  AST 48* 38  ALT 71* 56  ALKPHOS 79 60  BILITOT 0.6 0.5  PROT 7.3 6.2*  ALBUMIN 4.2 3.3*   No results for input(s): LIPASE, AMYLASE in the last 168 hours. No results for input(s): AMMONIA in the last 168 hours. Coagulation Profile: No results for input(s): INR, PROTIME in the last 168 hours. Cardiac Enzymes: No results for input(s): CKTOTAL, CKMB, CKMBINDEX, TROPONINI in the last 168 hours. BNP (last 3 results) No results for input(s): PROBNP in the last 8760 hours. HbA1C: No results for input(s): HGBA1C in the last 72 hours. CBG: No results for input(s): GLUCAP in the last 168 hours. Lipid Profile: No results for input(s): CHOL, HDL, LDLCALC, TRIG, CHOLHDL, LDLDIRECT in the last 72 hours. Thyroid Function Tests: No results for input(s): TSH, T4TOTAL, FREET4, T3FREE, THYROIDAB in the last 72 hours. Anemia Panel: No results for input(s): VITAMINB12, FOLATE, FERRITIN, TIBC, IRON, RETICCTPCT in the last 72 hours. Urine analysis:    Component Value Date/Time   COLORURINE YELLOW 11/03/2015 2251   APPEARANCEUR CLEAR 11/03/2015 2251   LABSPEC 1.016 11/03/2015 2251   PHURINE 6.5 11/03/2015 2251   GLUCOSEU NEGATIVE 11/03/2015 2251   HGBUR NEGATIVE 11/03/2015 2251   BILIRUBINUR NEGATIVE 11/03/2015 2251   BILIRUBINUR neg 10/27/2015 1202   KETONESUR NEGATIVE 11/03/2015 2251   PROTEINUR NEGATIVE 11/03/2015 2251   PROTEINUR neg 10/27/2015 1202   UROBILINOGEN 0.2 10/27/2015 1202   NITRITE NEGATIVE  11/03/2015 2251   NITRITE neg 10/27/2015 1202   LEUKOCYTESUR NEGATIVE 11/03/2015 2251   Sepsis Labs: @LABRCNTIP (procalcitonin:4,lacticidven:4)  ) Recent Results (from the past 240 hour(s))  Urine Culture     Status: None   Collection Time: 10/27/15 12:02 PM  Result Value Ref Range Status   Colony Count NO GROWTH  Final   Organism ID, Bacteria NO GROWTH  Final  Urine culture     Status: None (Preliminary result)   Collection Time: 11/03/15  3:46 PM  Result Value Ref Range Status   Specimen Description URINE, CLEAN CATCH  Final   Special Requests NONE  Final   Culture NO GROWTH < 24 HOURS  Final   Report Status PENDING  Incomplete      Radiology Studies: Ct Abdomen Pelvis Wo Contrast 11/03/2015   Over distended stomach with normalization at the pylorus. No identifiable cause for a gastric outlet obstruction.   Dg Chest 2 View 11/03/2015   No active cardiopulmonary disease.   Scheduled Meds: . apixaban  2.5 mg Oral BID  . atorvastatin  80 mg Oral Daily  . metoprolol succinate  50 mg Oral Daily  . pantoprazole (PROTONIX) IV  40 mg Intravenous Q24H  . sodium chloride flush  3 mL Intravenous Q12H   Continuous Infusions: . sodium chloride 0.45 % 1,000 mL infusion       LOS: 2 days    Time spent: 25 minutes   Leisa Lenz, MD [facility] Pager [phone]  If 7PM-7AM, please contact night-coverage [email or web address] Password [facility] 11/05/2015, 11:48 AM

## 2015-11-05 NOTE — Consult Note (Signed)
[facility] Consult: 9:53 AM 11/05/2015  LOS: 2 days    Referring Provider: Dr Charlies Silvers  Primary Care Physician:  Lynne Leader, MD Primary Gastroenterologist:  Althia Forts Wife: Ernestina Penna: cell [phone]     Reason for Consultation:  Nausea and vomiting   HPI: Bradley Stanley is a 82 y.o. male.  Lives in [place]. Hx CAD, MI and CABG 04/2015.  Smoker.  BPH.  AKI at time of [facility] illness with n/v/dehydration.  S/p 2000 bil mesh inguinal hernia repairs.   Not diabetic EGD ~ 2013 in [place] for eval of oral candidiasis and ? esophageal candidiasis, controlled GERD. No pathology except HH per his recall. Colonoscopy age 64 to 57 in [place]: unremarkable screening study.  Advised to have 10 year follow up study.   Has had constipation and harder BMs since CABG, improved with stool softener.  Slowly gained 12 # since CABG:  Appetite good.  GERD sxs controlled for many years with Omeprazole. Daily 81 ASA, no other ASA or NSAIDs.  No ETOH since 04/2015 and before that only some etoh after a game of golf.  N/v started Monday 10/31/15.  Many episodes, non-bloody and non CG.  Occurred 1 to 5 hours after eating.  + flatus, last BM was 4/11.  Unable to keep down meds.  BMs last on 4/10, until BM this AM.  + flatus.  Upper abdomen fells full, bloated, uncomfortable but not visceral pain,  CT scan, non-contrast: gastric distention but no outlet obstruction.   Labs:   AKI (stage 4), improving now to stage 3.  LFTs normal except albumin low at 3.3.  Hypokalemia resolved.  Hypernatremia. HGb 11.8.  WBCs 13.9, now 11.4.  NGT placed but he inadvertently pulled it out >24 hours ago and he refused replacement.  NPO so not vomiting. .  Oliguria improved with IVF.    Past Medical History  Diagnosis Date  . High  cholesterol   . Hypertension   . Renal insufficiency   . BPH (benign prostatic hyperplasia)   . GERD (gastroesophageal reflux disease)   . CAD (coronary artery disease)   . Heart murmur   . Myocardial infarction ([place]) 04/2015  . Pneumonia 05/2015    "after heart surgery"  . Chronic bronchitis ([place])   . Anxiety     "occasionally" (11/03/2015)    Past Surgical History  Procedure Laterality Date  . Cataract extraction w/ intraocular lens  implant, bilateral    . Coronary artery bypass graft  04/2015    CABG "X4; [place]"  . Inguinal hernia repair Bilateral   . Hand reconstruction Bilateral 1970s    "tore up by garage door spring"    Prior to Admission medications   Medication Sig Start Date End Date Taking? Authorizing Provider  apixaban (ELIQUIS) 5 MG TABS tablet Take 5 mg by mouth 2 (two) times daily.   Yes Historical Provider, MD  aspirin 81 MG tablet Take 81 mg by mouth daily.   Yes Historical Provider, MD  atorvastatin (LIPITOR) 80 MG tablet Take 1 tablet (  80 mg total) by mouth daily. 10/19/15  Yes Lelon Perla, MD  Cetirizine HCl 10 MG CAPS Take 10 mg by mouth daily.   Yes Historical Provider, MD  ciprofloxacin (CIPRO) 500 MG tablet Take 1 tablet (500 mg total) by mouth 2 (two) times daily. 10/27/15  Yes Gregor Hams, MD  doxazosin (CARDURA) 4 MG tablet Take 1 tablet (4 mg total) by mouth daily. 10/27/15  Yes Gregor Hams, MD  ferrous sulfate 325 (65 FE) MG EC tablet Take 325 mg by mouth 3 (three) times daily with meals.   Yes Historical Provider, MD  furosemide (LASIX) 40 MG tablet Take 40 mg by mouth.   Yes Historical Provider, MD  lisinopril (PRINIVIL,ZESTRIL) 10 MG tablet Take 10 mg by mouth daily.   Yes Historical Provider, MD  LORazepam (ATIVAN) 0.5 MG tablet Take 1 tablet (0.5 mg total) by mouth every 8 (eight) hours as needed for anxiety. 10/27/15  Yes Gregor Hams, MD  metoprolol succinate (TOPROL-XL) 50 MG 24 hr tablet Take 50 mg by mouth daily. Take with or  immediately following a meal.   Yes Historical Provider, MD  Multiple Vitamin (MULTIVITAMIN) tablet Take 1 tablet by mouth daily.   Yes Historical Provider, MD  omeprazole (PRILOSEC) 20 MG capsule Take 20 mg by mouth daily.   Yes Historical Provider, MD  potassium chloride (KLOR-CON) 20 MEQ packet Take by mouth 2 (two) times daily.   Yes Historical Provider, MD  tamsulosin (FLOMAX) 0.4 MG CAPS capsule Take 1 capsule (0.4 mg total) by mouth daily. 10/27/15  Yes Gregor Hams, MD  varenicline (CHANTIX STARTING MONTH PAK) 0.5 MG X 11 & 1 MG X 42 tablet Take one 0.5mg  tablet by mouth once daily for 3 days, then increase to one 0.5mg  tablet twice daily for 3 days, then increase to one 1mg  tablet twice daily. Patient not taking: Reported on 11/03/2015 10/27/15   Gregor Hams, MD    Scheduled Meds: . apixaban  2.5 mg Oral BID  . atorvastatin  80 mg Oral Daily  . metoprolol succinate  50 mg Oral Daily  . pantoprazole (PROTONIX) IV  40 mg Intravenous Q24H  . sodium chloride flush  3 mL Intravenous Q12H   Infusions: . 0.9 % NaCl with KCl 20 mEq / L 50 mL/hr at 11/04/15 1632   PRN Meds: acetaminophen **OR** acetaminophen, albuterol, calcium carbonate, HYDROmorphone (DILAUDID) injection, LORazepam, ondansetron (ZOFRAN) IV   Allergies as of 11/03/2015  . (No Known Allergies)    Family History  Problem Relation Age of Onset  . CAD Father   . Diabetes Sister   . Diabetes Brother     Social History   Social History  . Marital Status: Married    Spouse Name: N/A  . Number of Children: 3  . Years of Education: N/A   Occupational History  . Not on file.   Social History Main Topics  . Smoking status: Current Every Day Smoker -- 0.50 packs/day for 65 years    Types: Cigarettes  . Smokeless tobacco: Never Used  . Alcohol Use: 0.0 oz/week    0 Standard drinks or equivalent per week     Comment: 11/03/2015 "not drinking anything now"  . Drug Use: No  . Sexual Activity: No   Other Topics  Concern  . Not on file   Social History Narrative    REVIEW OF SYSTEMS: Constitutional:  Per HPI.  Walks his dogs, gardens  ENT:  No nose bleeds  Pulm:  No significant DOE.  Non-productive cough.  1/2 to 1ppd cigs CV:  No palpitations, no LE edema. No chest pain GU:  No hematuria, no frequency GI:  Per HPI.  No dysphagia Heme:  No unusual bleeding or bruising   Transfusions:  none Neuro:  No headaches, no peripheral tingling or numbness Derm:  No itching, no rash or sores.  Endocrine:  No sweats or chills.  No polyuria or dysuria Immunization:  Not queried.  Travel:  None beyond [place] in last few months.    PHYSICAL EXAM: Vital signs in last 24 hours: Filed Vitals:   11/04/15 2137 11/05/15 0521  BP: 133/53 123/99  Pulse: 72 75  Temp: 97.6 F (36.4 C) 97.9 F (36.6 C)  Resp: 18 18   Wt Readings from Last 3 Encounters:  11/04/15 59.421 kg (131 lb)  11/03/15 60.782 kg (134 lb)  10/31/15 65.318 kg (144 lb)    General: pleasant, older but not frail appearing WM Head:  No asymmetry or swellling  Eyes:  No icterus or pallor Ears:  Not HOH  Nose:  No congestion or discharge Mouth:  Somewhat dry oral MM.  No candida, sores or exudates Neck:  No mass, no TMG, no JVD Lungs:  Exp wheezes, smokers rough voice.  Good BS.  No dyspnea Heart: RRR.  No MRG.  S1/S2 audible Abdomen:  Soft, ND.  NT.  BS hypoactive, not high pitched or tinkling.  No increased tympany to percussion.  No HSM or masses.   Rectal: deferred   Musc/Skeltl: no erythema, no swelling, no contractures Extremities:  No CCE  Neurologic:  Oriented x 3.  Fully alert and cooperative.  No limb weakness, no tremor Skin:  No rash.  Lots of sun exposure changes but no suspicious lesions.  Tattoos:  none   Psych:  Pleasant, cooperative, calm.   Intake/Output from previous day: 04/14 0701 - 04/15 0700 In: 2071.7 [P.O.:240; I.V.:1831.7] Out: 950 [Urine:950] Intake/Output this shift:    LAB RESULTS:  Recent Labs   11/03/15 2330 11/04/15 0358 11/05/15 0545  WBC 13.9* 11.7* 11.4*  HGB 12.0* 11.1* 11.8*  HCT 36.9* 34.6* 37.4*  PLT 295 265 252   BMET Lab Results  Component Value Date   NA 147* 11/05/2015   NA 144 11/04/2015   NA 140 11/03/2015   K 3.5 11/05/2015   K 2.8* 11/04/2015   K 3.2* 11/03/2015   CL 97* 11/05/2015   CL 92* 11/04/2015   CL 84* 11/03/2015   CO2 37* 11/05/2015   CO2 38* 11/04/2015   CO2 39* 11/03/2015   GLUCOSE 114* 11/05/2015   GLUCOSE 116* 11/04/2015   GLUCOSE 159* 11/03/2015   BUN 51* 11/05/2015   BUN 62* 11/04/2015   BUN 68* 11/03/2015   CREATININE 2.08* 11/05/2015   CREATININE 2.39* 11/04/2015   CREATININE 2.84* 11/03/2015   CALCIUM 8.8* 11/05/2015   CALCIUM 8.4* 11/04/2015   CALCIUM 9.4 11/03/2015   LFT  Recent Labs  11/03/15 1448 11/04/15 0358  PROT 7.3 6.2*  ALBUMIN 4.2 3.3*  AST 48* 38  ALT 71* 56  ALKPHOS 79 60  BILITOT 0.6 0.5   PT/INR No results found for: INR, PROTIME Hepatitis Panel No results for input(s): HEPBSAG, HCVAB, HEPAIGM, HEPBIGM in the last 72 hours. C-Diff No components found for: CDIFF Lipase  No results found for: LIPASE  Drugs of Abuse  No results found for: LABOPIA, COCAINSCRNUR, LABBENZ, AMPHETMU, THCU, LABBARB   RADIOLOGY STUDIES: Ct Abdomen Pelvis Wo Contrast  11/03/2015  CLINICAL DATA:  Vomiting and constipation for 3 days. EXAM: CT ABDOMEN AND PELVIS WITHOUT CONTRAST TECHNIQUE: Multidetector CT imaging of the abdomen and pelvis was performed following the standard protocol without IV contrast. COMPARISON:  None. FINDINGS: Lower chest and abdominal wall: Status post low abdominal wall hernia repair. Extensive atherosclerosis, including the coronary arteries. No pneumonia or aspiration seen in the lower lungs. Hepatobiliary: Lobulated cyst in the anterior right lobe measuring up to 3 cm.High-density material in the gallbladder without discrete calcified stone. No superimposed inflammatory changes Pancreas:  Unremarkable. Spleen: Granulomatous changes Adrenals/Urinary Tract: Negative adrenals. Renal hilar calcifications are elongated and likely vascular. There is smooth symmetric bilateral renal atrophy. No hydronephrosis Unremarkable bladder. Reproductive:Symmetric enlargement of the prostate projecting into the bladder base Stomach/Bowel: Dilated stomach with fluid levels, with normalization of luminal diameter at the level of the pylorus. Semi-solid/ingested material appears to traverse the pylorus. There is no associated volvulus. No visible perforation, inflammation, bezoar or pneumatosis. Probable appendectomy. Vascular/Lymphatic: Extensive atherosclerosis. No acute vascular abnormality. No mass or adenopathy. Peritoneal: No ascites or pneumoperitoneum. Musculoskeletal: No acute abnormalities. Usual degenerative changes for age. IMPRESSION: Over distended stomach with normalization at the pylorus. No identifiable cause for a gastric outlet obstruction. Electronically Signed   By: Monte Fantasia M.D.   On: 11/03/2015 19:43   Dg Chest 2 View  11/03/2015  CLINICAL DATA:  Cough for 4 days EXAM: CHEST  2 VIEW COMPARISON:  None. FINDINGS: The heart size and mediastinal contours are within normal limits. Both lungs are clear. The visualized skeletal structures are unremarkable. IMPRESSION: No active cardiopulmonary disease. Electronically Signed   By: Inez Catalina M.D.   On: 11/03/2015 15:17    ENDOSCOPIC STUDIES: Per HPI.   IMPRESSION:   *  N/v and gastric retention without obvious GOO  *  AKI.  Improved.  Baseline GFR not known.   Hypokalemia resolved.   *  CAD, MI, CABG 04/2015.     PLAN:     *  EGD.     Azucena Freed  11/05/2015, 9:53 AM Pager: [phone]

## 2015-11-05 NOTE — Progress Notes (Signed)
QTc 535 reported from [employer]. Paged Dr. Charlies Silvers for notification.

## 2015-11-06 ENCOUNTER — Encounter (HOSPITAL_COMMUNITY): Payer: Self-pay

## 2015-11-06 ENCOUNTER — Encounter (HOSPITAL_COMMUNITY)
Admission: EM | Disposition: A | Discharge status: Home / Self Care | Payer: Self-pay | Source: Home / Self Care | Attending: Internal Medicine

## 2015-11-06 DIAGNOSIS — K311 Adult hypertrophic pyloric stenosis: Secondary | ICD-10-CM | POA: Insufficient documentation

## 2015-11-06 DIAGNOSIS — K259 Gastric ulcer, unspecified as acute or chronic, without hemorrhage or perforation: Secondary | ICD-10-CM | POA: Insufficient documentation

## 2015-11-06 HISTORY — PX: ESOPHAGOGASTRODUODENOSCOPY: SHX5428

## 2015-11-06 LAB — CBC
HEMATOCRIT: 36.2 % — AB (ref 39.0–52.0)
Hemoglobin: 11.4 g/dL — ABNORMAL LOW (ref 13.0–17.0)
MCH: 28.9 pg (ref 26.0–34.0)
MCHC: 31.5 g/dL (ref 30.0–36.0)
MCV: 91.9 fL (ref 78.0–100.0)
PLATELETS: 259 10*3/uL (ref 150–400)
RBC: 3.94 MIL/uL — ABNORMAL LOW (ref 4.22–5.81)
RDW: 16.1 % — AB (ref 11.5–15.5)
WBC: 14.4 10*3/uL — AB (ref 4.0–10.5)

## 2015-11-06 LAB — BASIC METABOLIC PANEL
Anion gap: 14 (ref 5–15)
BUN: 48 mg/dL — ABNORMAL HIGH (ref 6–20)
CALCIUM: 8.7 mg/dL — AB (ref 8.9–10.3)
CO2: 34 mmol/L — AB (ref 22–32)
Chloride: 95 mmol/L — ABNORMAL LOW (ref 101–111)
Creatinine, Ser: 1.85 mg/dL — ABNORMAL HIGH (ref 0.61–1.24)
GFR calc Af Amer: 38 mL/min — ABNORMAL LOW (ref >60)
GFR, EST NON AFRICAN AMERICAN: 33 mL/min — AB (ref >60)
GLUCOSE: 95 mg/dL (ref 65–99)
Potassium: 3 mmol/L — ABNORMAL LOW (ref 3.5–5.1)
Sodium: 143 mmol/L (ref 135–145)

## 2015-11-06 SURGERY — EGD (ESOPHAGOGASTRODUODENOSCOPY)
Anesthesia: Moderate Sedation

## 2015-11-06 MED ORDER — PROMETHAZINE HCL 25 MG RE SUPP
25.0000 mg | Freq: Four times a day (QID) | RECTAL | Status: DC | PRN
  Filled 2015-11-06: qty 1

## 2015-11-06 MED ORDER — MIDAZOLAM HCL 5 MG/ML IJ SOLN
INTRAMUSCULAR | Status: AC
  Filled 2015-11-06: qty 1

## 2015-11-06 MED ORDER — POTASSIUM CHLORIDE 10 MEQ/100ML IV SOLN
10.0000 meq | INTRAVENOUS | Status: AC
  Administered 2015-11-06 (×3): 10 meq via INTRAVENOUS
  Filled 2015-11-06 (×5): qty 100

## 2015-11-06 MED ORDER — MIDAZOLAM HCL 10 MG/2ML IJ SOLN
INTRAMUSCULAR | Status: DC | PRN
  Administered 2015-11-06 (×4): 1 mg via INTRAVENOUS
  Administered 2015-11-06: 2 mg via INTRAVENOUS

## 2015-11-06 MED ORDER — FENTANYL CITRATE (PF) 100 MCG/2ML IJ SOLN
INTRAMUSCULAR | Status: DC | PRN
  Administered 2015-11-06: 25 ug via INTRAVENOUS
  Administered 2015-11-06: 12.5 ug via INTRAVENOUS
  Administered 2015-11-06: 25 ug via INTRAVENOUS

## 2015-11-06 MED ORDER — SUCRALFATE 1 GM/10ML PO SUSP
1.0000 g | Freq: Three times a day (TID) | ORAL | Status: DC
  Administered 2015-11-06 – 2015-11-08 (×8): 1 g via ORAL
  Filled 2015-11-06 (×8): qty 10

## 2015-11-06 MED ORDER — BUTAMBEN-TETRACAINE-BENZOCAINE 2-2-14 % EX AERO
INHALATION_SPRAY | CUTANEOUS | Status: DC | PRN
  Administered 2015-11-06: 2 via TOPICAL

## 2015-11-06 MED ORDER — FENTANYL CITRATE (PF) 100 MCG/2ML IJ SOLN
INTRAMUSCULAR | Status: AC
  Filled 2015-11-06: qty 2

## 2015-11-06 MED ORDER — PROCHLORPERAZINE EDISYLATE 5 MG/ML IJ SOLN
10.0000 mg | Freq: Four times a day (QID) | INTRAMUSCULAR | Status: DC | PRN
  Administered 2015-11-06: 10 mg via INTRAVENOUS
  Filled 2015-11-06: qty 2

## 2015-11-06 MED ORDER — SODIUM CHLORIDE 0.9 % IV SOLN: INTRAVENOUS | Status: DC

## 2015-11-06 NOTE — Progress Notes (Signed)
Patient ID: Bradley Stanley, male   DOB: [birth year], 82 y.o.   MRN: [id number]  PROGRESS NOTE    Hillery Reinsmith  [id number] DOB: 1933-01-28 DOA: 11/03/2015  PCP: Lynne Leader, MD  Outpatient Specialists:   Brief Narrative:  82 y.o. male with a past medical history significant for CAD s/p CABG x4 in 2016, HTN, PVD, HTN, atrial fibrillation on apixaban, and GERD who presented to [facility] ED with non bloody vomiting and poor po intake for past 3 days prior to this admission. No fevers, no diarrhea. He had mild abdominal pain on the right side which has improved since admission.   He was hemodynamically stable on admission. CT of the abdomen and pelvis with contrast showed a distended stomach, with ?semi-solid ingested material traversing the pylorus, no obvious obstructing mass and no SBO.He had HG tube placed but he removed it himself due to discomfort.   EGD done by Dr. Hilarie Fredrickson 11/06/2015 - Moderately severe reflux esophagitis. Excessive gastric fluid. Fluid aspiration performed. A large amount of food (residue) in the stomach. Partially obstructing non-bleeding gastric ulcers with no stigmata of bleeding at pylorus. Gastric stenosis was found at the pylorus with edema/inflammation and plant material causing obstruction. Plant material removed. Pylorus traversed with pediatric endoscope. Dilation not performed today given Eliquis last dose 4/15.Mild bulbar duodenitis. Normal second portion of the duodenum.   Assessment & Plan:   Principal Problem:   Nausea and vomiting - EGD by Dr. Hilarie Fredrickson 11/06/2015 - Moderately severe reflux esophagitis. Excessive gastric fluid. Fluid aspiration performed. A large amount of food (residue) in the stomach. Partially obstructing non-bleeding gastric ulcers with no stigmata of bleeding at pylorus. Gastric stenosis was found at the pylorus with edema/inflammation and plant material causing obstruction. Plant material removed. Pylorus traversed with pediatric endoscope. Dilation  not performed today given Eliquis last dose 4/15.Mild bulbar duodenitis. Normal second portion of the duodenum.  - Continue protonix IV twice daily  - Appreciate GI help - Clear liquid diet  - may need pylorus dilatation so apixaban on hold   Active Problems:   Essential hypertension - BP 126/47    Peripheral vascular disease (HCC) - Hold apixaban for possible pylorus dilatation     Hypokalemia - Due to GI losses, lasix - Supplemented     Hypernatremia  - Due to dehydration - Improved with 1/2 NS    Leukocytosis - Likely reactive    CKD (chronic kidney disease) stage 4, GFR 15-29 ml/min (HCC) - No previous values in EPIC for comparison but considering anemia pt probably has degree of CKD - Stopped lisinopril and lasix which could have contributed to renal insufficiency - Cr 3.06 --> 2.39 --> 2.08 --> 1.85; better with fluids     Paroxysmal atrial fibrillation (HCC) - CHADs vasc score 4 (age, gender, hypertension, PVD) - On Anticoagulation with apixaban which we will place on hold in case pylorus dilatation is needed - Rate controlled with metoprolol    Dyslipidemia - Continue atorvastatin 80 mg daily once he resumes regular diet    DVT prophylaxis: Apixaban but now on hold in case pylorus dilatation is needed Code Status: full code Family Communication: wife at the bedside   Disposition Plan: home once he is stable, likely by 11/09/2015   Consultants:   Gastroenterology, Dr. Zenovia Jarred   Procedures:  EGD by Dr. Hilarie Fredrickson 11/06/2015 - Moderately severe reflux esophagitis. Excessive gastric fluid. Fluid aspiration performed. A large amount of food (residue) in the stomach. Partially obstructing non-bleeding gastric ulcers with no  stigmata of bleeding at pylorus. Gastric stenosis was found at the pylorus with edema/inflammation and plant material causing obstruction. Plant material removed. Pylorus traversed with pediatric endoscope. Dilation not performed today given Eliquis  last dose 4/15.Mild bulbar duodenitis. Normal second portion of the duodenum.   Antimicrobials:   None     Subjective: Cannot tolerates anything po, haas nausea and vomiting.  Objective: Filed Vitals:   11/06/15 1105 11/06/15 1110 11/06/15 1115 11/06/15 1120  BP: 103/36 101/31 126/42 126/47  Pulse: 65 64 64   Temp:      TempSrc:      Resp: 17 16 17    Height:      Weight:      SpO2: 100% 99% 99%     Intake/Output Summary (Last 24 hours) at 11/06/15 1331 Last data filed at 11/06/15 0600  Gross per 24 hour  Intake    230 ml  Output    240 ml  Net    -10 ml   Filed Weights   11/03/15 1436 11/04/15 0608  Weight: 60.328 kg (133 lb) 59.421 kg (131 lb)    Examination:  General exam: Appears calm and comfortable  Respiratory system: good resp effort, no wheezing  Cardiovascular system: S1 & S2 (+), RRR. No murmurs. Gastrointestinal system: (+) BS, non tender abdomen, non distended  Central nervous system: No focal deficits, alert and awake  Extremities: Strength 5/5, no cyanosis  Skin: Warm, dry skin  Psychiatry: Normal mood and behavior, no agitation.  Data Reviewed: I have personally reviewed following labs and imaging studies  CBC:  Recent Labs Lab 11/03/15 1448 11/03/15 2330 11/04/15 0358 11/05/15 0545 11/06/15 0544  WBC 13.8* 13.9* 11.7* 11.4* 14.4*  NEUTROABS 10.3*  --   --   --   --   HGB 13.5 12.0* 11.1* 11.8* 11.4*  HCT 41.0 36.9* 34.6* 37.4* 36.2*  MCV 90.7 91.6 91.5 94.2 91.9  PLT 335 295 265 252 [id number]   Basic Metabolic Panel:  Recent Labs Lab 11/03/15 1448 11/03/15 2330 11/04/15 0358 11/05/15 0545 11/06/15 0544  NA 140  --  144 147* 143  K 3.2*  --  2.8* 3.5 3.0*  CL 84*  --  92* 97* 95*  CO2 39*  --  38* 37* 34*  GLUCOSE 159*  --  116* 114* 95  BUN 68*  --  62* 51* 48*  CREATININE 3.06* 2.84* 2.39* 2.08* 1.85*  CALCIUM 9.4  --  8.4* 8.8* 8.7*   GFR: Estimated Creatinine Clearance: 26.8 mL/min (by C-G formula based on Cr of  1.85). Liver Function Tests:  Recent Labs Lab 11/03/15 1448 11/04/15 0358  AST 48* 38  ALT 71* 56  ALKPHOS 79 60  BILITOT 0.6 0.5  PROT 7.3 6.2*  ALBUMIN 4.2 3.3*   No results for input(s): LIPASE, AMYLASE in the last 168 hours. No results for input(s): AMMONIA in the last 168 hours. Coagulation Profile: No results for input(s): INR, PROTIME in the last 168 hours. Cardiac Enzymes: No results for input(s): CKTOTAL, CKMB, CKMBINDEX, TROPONINI in the last 168 hours. BNP (last 3 results) No results for input(s): PROBNP in the last 8760 hours. HbA1C: No results for input(s): HGBA1C in the last 72 hours. CBG: No results for input(s): GLUCAP in the last 168 hours. Lipid Profile: No results for input(s): CHOL, HDL, LDLCALC, TRIG, CHOLHDL, LDLDIRECT in the last 72 hours. Thyroid Function Tests: No results for input(s): TSH, T4TOTAL, FREET4, T3FREE, THYROIDAB in the last 72 hours. Anemia Panel: No results  for input(s): VITAMINB12, FOLATE, FERRITIN, TIBC, IRON, RETICCTPCT in the last 72 hours. Urine analysis:    Component Value Date/Time   COLORURINE YELLOW 11/03/2015 2251   APPEARANCEUR CLEAR 11/03/2015 2251   LABSPEC 1.016 11/03/2015 2251   PHURINE 6.5 11/03/2015 2251   GLUCOSEU NEGATIVE 11/03/2015 2251   HGBUR NEGATIVE 11/03/2015 2251   BILIRUBINUR NEGATIVE 11/03/2015 2251   BILIRUBINUR neg 10/27/2015 1202   KETONESUR NEGATIVE 11/03/2015 2251   PROTEINUR NEGATIVE 11/03/2015 2251   PROTEINUR neg 10/27/2015 1202   UROBILINOGEN 0.2 10/27/2015 1202   NITRITE NEGATIVE 11/03/2015 2251   NITRITE neg 10/27/2015 1202   LEUKOCYTESUR NEGATIVE 11/03/2015 2251   Sepsis Labs: @LABRCNTIP (procalcitonin:4,lacticidven:4)  ) Recent Results (from the past 240 hour(s))  Urine culture     Status: None   Collection Time: 11/03/15  3:46 PM  Result Value Ref Range Status   Specimen Description URINE, CLEAN CATCH  Final   Special Requests NONE  Final   Culture NO GROWTH 2 DAYS  Final    Report Status 11/05/2015 FINAL  Final      Radiology Studies: Ct Abdomen Pelvis Wo Contrast 11/03/2015   Over distended stomach with normalization at the pylorus. No identifiable cause for a gastric outlet obstruction.   Dg Chest 2 View 11/03/2015   No active cardiopulmonary disease.   Scheduled Meds: . metoprolol succinate  50 mg Oral Daily  . pantoprazole (PROTONIX) IV  40 mg Intravenous Q12H  . sodium chloride flush  3 mL Intravenous Q12H  . sucralfate  1 g Oral TID WC & HS   Continuous Infusions: . sodium chloride 0.45 % 1,000 mL infusion Stopped (11/06/15 0518)     LOS: 3 days    Time spent: 25 minutes   Leisa Lenz, MD [facility] Pager [phone]  If 7PM-7AM, please contact night-coverage [email or web address] Password [email or web address] 11/06/2015, 1:31 PM

## 2015-11-06 NOTE — Op Note (Signed)
[facility] Patient Name: Bradley Stanley Procedure Date : 11/06/2015 MRN: [id number] Attending MD: Jerene Bears , MD Date of Birth: 07/08/1933 CSN: [id number] Age: 82 Admit Type: Inpatient Procedure:                Upper GI endoscopy Indications:              history of gastro-esophageal reflux disease,                            Abnormal CT of the GI tract (gastric distention),                            Nausea with vomiting and inability to PO Providers:                Lajuan Lines. Hilarie Fredrickson, MD, Carolynn Comment, RN, Alfonso Patten, Technician Referring MD:              Medicines:                Fentanyl 62.5 micrograms IV, Midazolam 6 mg IV Complications:            No immediate complications. Estimated Blood Loss:     Estimated blood loss: none. Procedure:                Pre-Anesthesia Assessment:                           - Prior to the procedure, a History and Physical                            was performed, and patient medications and                            allergies were reviewed. The patient's tolerance of                            previous anesthesia was also reviewed. The risks                            and benefits of the procedure and the sedation                            options and risks were discussed with the patient.                            All questions were answered, and informed consent                            was obtained. Prior Anticoagulants: The patient has                            taken Eliquis (apixaban), last dose was 1 day prior  to procedure. ASA Grade Assessment: III - A patient                            with severe systemic disease. After reviewing the                            risks and benefits, the patient was deemed in                            satisfactory condition to undergo the procedure.                           After obtaining informed consent, the endoscope was                             passed under direct vision. Throughout the                            procedure, the patient's blood pressure, pulse, and                            oxygen saturations were monitored continuously. The                            [id number] scope was introduced through the                            mouth, and advanced to the second part of duodenum.                            The [id number]) scope was introduced through                            the and advanced to the. The upper GI endoscopy was                            accomplished without difficulty. The patient                            tolerated the procedure fairly well. Scope In: Scope Out: Findings:      Moderately severe esophagitis with bleeding was found in the middle and       lower third of the esophagus as well as at the GEJ. This is felt       secondary to reflux.      Excessive fluid and food residue was found in the entire examined       stomach. Fluid aspiration was performed through the scope suction       channel. The amount of fluid collected was 3.2 L. After as much as       lavage as possible, a Jabier Mutton net was used to collect and extract a large       amount of vegetable material from the stomach.      A large amount of food (residue) was found at the pylorus and  was       causing obstuction of the pyloric channel. Removal of food was       accomplished with rat-toothed forceps.      Few partially obstructing non-bleeding superficial gastric ulcers with       no stigmata of bleeding were found at the pylorus associated wtih       contact oozing and causing gastric outlet obstruction.      A benign-appearing, intrinsic moderate to severe stenosis was found at       the pylorus. The scope was withdrawn and replaced with the pediatric       endoscope because of difficulty passing the scope through the narrowed       pyloric channel. The pylorus was traversed with the peds  endoscope.      Localized mild inflammation characterized by congestion (edema) and       erythema was found in the duodenal bulb.      The second portion of the duodenum was normal.      The cardia and gastric fundus were normal on retroflexion. Impression:               - Moderately severe reflux esophagitis.                           - Excessive gastric fluid. Fluid aspiration                            performed.                           - A large amount of food (residue) in the stomach.                            Removal was successful with Jabier Mutton net.                           - Partially obstructing non-bleeding gastric ulcers                            with no stigmata of bleeding at pylorus.                           - Gastric stenosis was found at the pylorus with                            edema/inflammation and plant material causing                            obstruction. Plant material removed. Pylorus                            traversed with pediatric endoscope. Dilation not                            performed today given Eliquis last dose yesterday.                           - Mild bulbar duodenitis.                           -  Normal second portion of the duodenum. Moderate Sedation:      Moderate (conscious) sedation was administered by the endoscopy nurse       and supervised by the endoscopist. The following parameters were       monitored: oxygen saturation, heart rate, blood pressure, and response       to care. Total physician intraservice time was 55 minutes. Recommendation:           - Return patient to hospital ward for ongoing care.                           - Clear liquid diet.                           - Use a proton pump inhibitor IV BID.                           - Crush or change all possible medications to                            liquid. Continue to hold Eliquis in the event                            pyloric dilation is needed.                            - Use sucralfate suspension 1 gram PO QID.                           - Could consider metoclopramide depending on                            clincial course.                           - Check H. pylori Ab and treat if positive.                           - No aspirin, ibuprofen, naproxen, or other                            non-steroidal anti-inflammatory drugs. Procedure Code(s):        --- Professional ---                           [phone], Esophagogastroduodenoscopy, flexible,                            transoral; with removal of foreign body(s)                           99152, Moderate sedation services provided by the                            same physician or other qualified health care  professional performing the diagnostic or                            therapeutic service that the sedation supports,                            requiring the presence of an independent trained                            observer to assist in the monitoring of the                            patient's level of consciousness and physiological                            status; initial 15 minutes of intraservice time,                            patient age 70 years or older                           [phone], Moderate sedation services; each additional                            15 minutes intraservice time                           99153, Moderate sedation services; each additional                            15 minutes intraservice time                           99153, Moderate sedation services; each additional                            15 minutes intraservice time Diagnosis Code(s):        --- Professional ---                           K21.0, Gastro-esophageal reflux disease with                            esophagitis                           T18.2XXA, Foreign body in stomach, initial encounter                           K25.9, Gastric ulcer, unspecified as acute or                             chronic, without hemorrhage or perforation                           K31.1, Adult hypertrophic pyloric stenosis  K29.80, Duodenitis without bleeding                           R11.2, Nausea with vomiting, unspecified                           R93.3, Abnormal findings on diagnostic imaging of                            other parts of digestive tract CPT copyright 2016 [employer]. All rights reserved. The codes documented in this report are preliminary and upon coder review may  be revised to meet current compliance requirements. Jerene Bears, MD 11/06/2015 11:32:16 AM This report has been signed electronically. Number of Addenda: 0

## 2015-11-07 DIAGNOSIS — K209 Esophagitis, unspecified: Secondary | ICD-10-CM

## 2015-11-07 LAB — COMPREHENSIVE METABOLIC PANEL
ALK PHOS: 51 U/L (ref 38–126)
ALT: 42 U/L (ref 17–63)
AST: 29 U/L (ref 15–41)
Albumin: 2.8 g/dL — ABNORMAL LOW (ref 3.5–5.0)
Anion gap: 10 (ref 5–15)
BILIRUBIN TOTAL: 0.5 mg/dL (ref 0.3–1.2)
BUN: 34 mg/dL — AB (ref 6–20)
CO2: 29 mmol/L (ref 22–32)
CREATININE: 1.43 mg/dL — AB (ref 0.61–1.24)
Calcium: 7.9 mg/dL — ABNORMAL LOW (ref 8.9–10.3)
Chloride: 97 mmol/L — ABNORMAL LOW (ref 101–111)
GFR, EST AFRICAN AMERICAN: 52 mL/min — AB (ref >60)
GFR, EST NON AFRICAN AMERICAN: 45 mL/min — AB (ref >60)
Glucose, Bld: 101 mg/dL — ABNORMAL HIGH (ref 65–99)
Potassium: 3.2 mmol/L — ABNORMAL LOW (ref 3.5–5.1)
Sodium: 136 mmol/L (ref 135–145)
Total Protein: 5.1 g/dL — ABNORMAL LOW (ref 6.5–8.1)

## 2015-11-07 LAB — CBC
HEMATOCRIT: 33.7 % — AB (ref 39.0–52.0)
Hemoglobin: 10.9 g/dL — ABNORMAL LOW (ref 13.0–17.0)
MCH: 29.1 pg (ref 26.0–34.0)
MCHC: 32.3 g/dL (ref 30.0–36.0)
MCV: 89.9 fL (ref 78.0–100.0)
Platelets: 228 10*3/uL (ref 150–400)
RBC: 3.75 MIL/uL — ABNORMAL LOW (ref 4.22–5.81)
RDW: 15.4 % (ref 11.5–15.5)
WBC: 15 10*3/uL — AB (ref 4.0–10.5)

## 2015-11-07 MED ORDER — POTASSIUM CHLORIDE 10 MEQ/100ML IV SOLN
10.0000 meq | INTRAVENOUS | Status: AC
Start: 2015-11-07 — End: 2015-11-07
  Administered 2015-11-07 (×3): 10 meq via INTRAVENOUS
  Filled 2015-11-07 (×3): qty 100

## 2015-11-07 MED ORDER — BOOST / RESOURCE BREEZE PO LIQD: 1.0000 | Freq: Three times a day (TID) | ORAL | Status: DC

## 2015-11-07 MED ORDER — BOOST / RESOURCE BREEZE PO LIQD
1.0000 | Freq: Three times a day (TID) | ORAL | Status: DC
  Administered 2015-11-07: 1 via ORAL

## 2015-11-07 MED ORDER — ENSURE ENLIVE PO LIQD
237.0000 mL | Freq: Two times a day (BID) | ORAL | Status: DC
  Administered 2015-11-07 – 2015-11-08 (×3): 237 mL via ORAL

## 2015-11-07 NOTE — Care Management Note (Signed)
Case Management Note  Patient Details  Name: Bradley Stanley MRN: [id number] Date of Birth: July 07, 1933  Subjective/Objective:              Admitted with nausea/vomiting, weight loss. S/p  EGD 11/06/2014. From home with wife, independent with ADL's. No DME usage pta. PCP: Michiel Cowboy.   Action/Plan: Return to home when medically stable. CM to follow up with disposition needs.  Expected Discharge Date:                  Expected Discharge Plan:  Home/Self Care  In-House Referral:     Discharge planning Services  CM Consult  Post Acute Care Choice:    Choice offered to:     DME Arranged:    DME Agency:     HH Arranged:    [place] Agency:     Status of Service:    Medicare Important Message Given:    Date Medicare IM Given:    Medicare IM give by:    Date Additional Medicare IM Given:    Additional Medicare Important Message give by:     If discussed at [place] of Stay Meetings, dates discussed:    Additional Comments: CM spoke with pt /wife regarding discharge planning. Pt is very independent and plans to assume care for self @ discharge with wife's assistance if needed.  Whitman Hero [place], [place] [phone] 11/07/2015, 11:51 AM

## 2015-11-07 NOTE — Care Management Important Message (Signed)
Important Message  Patient Details  Name: Bradley Stanley MRN: [id number] Date of Birth: 01/22/1933   Medicare Important Message Given:  Yes    Aldo Sondgeroth P Timberlynn Kizziah 11/07/2015, 2:03 PM

## 2015-11-07 NOTE — Progress Notes (Signed)
Patient ID: Bradley Stanley, male   DOB: 1932-09-03, 82 y.o.   MRN: [id number]  PROGRESS NOTE    Bradley Stanley  [id number] DOB: 12/07/1932 DOA: 11/03/2015  PCP: Lynne Leader, MD  Outpatient Specialists:   Brief Narrative:  82 y.o. male with a past medical history significant for CAD s/p CABG x4 in 2016, HTN, PVD, HTN, atrial fibrillation on apixaban, and GERD who presented to [facility] ED with non bloody vomiting and poor po intake for past 3 days prior to this admission. No fevers, no diarrhea. He had mild abdominal pain on the right side which has improved since admission.   He was hemodynamically stable on admission. CT of the abdomen and pelvis with contrast showed a distended stomach, with ?semi-solid ingested material traversing the pylorus, no obvious obstructing mass and no SBO.He had HG tube placed but he removed it himself due to discomfort.   EGD done by Dr. Hilarie Fredrickson 11/06/2015 - Moderately severe reflux esophagitis. Excessive gastric fluid. Fluid aspiration performed. A large amount of food (residue) in the stomach. Partially obstructing non-bleeding gastric ulcers with no stigmata of bleeding at pylorus. Gastric stenosis was found at the pylorus with edema/inflammation and plant material causing obstruction. Plant material removed. Pylorus traversed with pediatric endoscope. Dilation not performed today given Eliquis last dose 4/15.Mild bulbar duodenitis. Normal second portion of the duodenum.   Assessment & Plan:   Principal Problem:   Nausea and vomiting - EGD by Dr. Hilarie Fredrickson 11/06/2015 - Moderately severe reflux esophagitis. Excessive gastric fluid. Fluid aspiration performed. A large amount of food (residue) in the stomach. Partially obstructing non-bleeding gastric ulcers with no stigmata of bleeding at pylorus. Gastric stenosis was found at the pylorus with edema/inflammation and plant material causing obstruction. Plant material removed. Pylorus traversed with pediatric endoscope. Dilation  not performed given Eliquis last dose 4/15. - Continue protonix IV twice daily  - Appreciate GI following - Clears today, not advancing further per GI   Active Problems:   Essential hypertension - Continue metoprolol     Peripheral vascular disease (HCC) - Hold apixaban in case pylorus dilatation needed     Hypokalemia - Due to GI losses, lasix - Supplemented     Hypernatremia  - Due to dehydration - Sodium normalized with 1/2 NS    Leukocytosis - Likely reactive    CKD (chronic kidney disease) stage 4, GFR 15-29 ml/min (HCC) - No previous values in EPIC for comparison but considering anemia pt probably has degree of CKD - Stopped lisinopril and lasix since they could have contributed to renal insufficiency - Cr 3.06 --> 2.39 --> 2.08 --> 1.85 --> 1.43; better with fluids     Paroxysmal atrial fibrillation (HCC) - CHADs vasc score 4 (age, gender, hypertension, PVD) - On Anticoagulation with apixaban which is on hold in care pylorus dilatation required  - Rate controlled with metoprolol    Dyslipidemia - Resume atorvastatin 80 mg daily once he resumes regular diet    DVT prophylaxis: Apixaban but now on hold in case pylorus dilatation is needed Code Status: full code Family Communication: wife at the bedside   Disposition Plan: home once he is stable, likely by 11/09/2015   Consultants:   Gastroenterology, Dr. Zenovia Jarred   Procedures:  EGD by Dr. Hilarie Fredrickson 11/06/2015 - Moderately severe reflux esophagitis. Excessive gastric fluid. Fluid aspiration performed. A large amount of food (residue) in the stomach. Partially obstructing non-bleeding gastric ulcers with no stigmata of bleeding at pylorus. Gastric stenosis was found at the pylorus  with edema/inflammation and plant material causing obstruction. Plant material removed. Pylorus traversed with pediatric endoscope. Dilation not performed today given Eliquis last dose 4/15.Mild bulbar duodenitis. Normal second portion of the  duodenum.   Antimicrobials:   None     Subjective: Feels so much better this am.  Objective: Filed Vitals:   11/06/15 1448 11/06/15 2157 11/07/15 0515 11/07/15 1132  BP: 112/49 120/58 120/62 138/61  Pulse: 60 63 61 73  Temp: 98 F (36.7 C) 99.3 F (37.4 C) 98.2 F (36.8 C)   TempSrc: Oral Oral    Resp: 17 20 20    Height:      Weight:      SpO2: 98% 91% 96%     Intake/Output Summary (Last 24 hours) at 11/07/15 1407 Last data filed at 11/07/15 1144  Gross per 24 hour  Intake    700 ml  Output   1025 ml  Net   -325 ml   Filed Weights   11/03/15 1436 11/04/15 0608  Weight: 60.328 kg (133 lb) 59.421 kg (131 lb)    Examination:  General exam: Appears calm, no distress  Respiratory system: no wheezing, no rhonchi  Cardiovascular system: S1 & S2 appreciated, rate controlled, no leg swelling  Gastrointestinal system: (+) BS, no distention, no tenderness  Central nervous system: Nonfocal, alert  Extremities: No cyanosis, no tenderness  Skin: no rash, skin warm, dry Psychiatry: Normal mood and behavior, not restless   Data Reviewed: I have personally reviewed following labs and imaging studies  CBC:  Recent Labs Lab 11/03/15 1448 11/03/15 2330 11/04/15 0358 11/05/15 0545 11/06/15 0544 11/07/15 0509  WBC 13.8* 13.9* 11.7* 11.4* 14.4* 15.0*  NEUTROABS 10.3*  --   --   --   --   --   HGB 13.5 12.0* 11.1* 11.8* 11.4* 10.9*  HCT 41.0 36.9* 34.6* 37.4* 36.2* 33.7*  MCV 90.7 91.6 91.5 94.2 91.9 89.9  PLT 335 295 265 252 259 [id number]   Basic Metabolic Panel:  Recent Labs Lab 11/03/15 1448 11/03/15 2330 11/04/15 0358 11/05/15 0545 11/06/15 0544 11/07/15 0509  NA 140  --  144 147* 143 136  K 3.2*  --  2.8* 3.5 3.0* 3.2*  CL 84*  --  92* 97* 95* 97*  CO2 39*  --  38* 37* 34* 29  GLUCOSE 159*  --  116* 114* 95 101*  BUN 68*  --  62* 51* 48* 34*  CREATININE 3.06* 2.84* 2.39* 2.08* 1.85* 1.43*  CALCIUM 9.4  --  8.4* 8.8* 8.7* 7.9*   GFR: Estimated Creatinine  Clearance: 34.6 mL/min (by C-G formula based on Cr of 1.43). Liver Function Tests:  Recent Labs Lab 11/03/15 1448 11/04/15 0358 11/07/15 0509  AST 48* 38 29  ALT 71* 56 42  ALKPHOS 79 60 51  BILITOT 0.6 0.5 0.5  PROT 7.3 6.2* 5.1*  ALBUMIN 4.2 3.3* 2.8*   No results for input(s): LIPASE, AMYLASE in the last 168 hours. No results for input(s): AMMONIA in the last 168 hours. Coagulation Profile: No results for input(s): INR, PROTIME in the last 168 hours. Cardiac Enzymes: No results for input(s): CKTOTAL, CKMB, CKMBINDEX, TROPONINI in the last 168 hours. BNP (last 3 results) No results for input(s): PROBNP in the last 8760 hours. HbA1C: No results for input(s): HGBA1C in the last 72 hours. CBG: No results for input(s): GLUCAP in the last 168 hours. Lipid Profile: No results for input(s): CHOL, HDL, LDLCALC, TRIG, CHOLHDL, LDLDIRECT in the last 72 hours. Thyroid  Function Tests: No results for input(s): TSH, T4TOTAL, FREET4, T3FREE, THYROIDAB in the last 72 hours. Anemia Panel: No results for input(s): VITAMINB12, FOLATE, FERRITIN, TIBC, IRON, RETICCTPCT in the last 72 hours. Urine analysis:    Component Value Date/Time   COLORURINE YELLOW 11/03/2015 2251   APPEARANCEUR CLEAR 11/03/2015 2251   LABSPEC 1.016 11/03/2015 2251   PHURINE 6.5 11/03/2015 2251   GLUCOSEU NEGATIVE 11/03/2015 2251   HGBUR NEGATIVE 11/03/2015 2251   BILIRUBINUR NEGATIVE 11/03/2015 2251   BILIRUBINUR neg 10/27/2015 1202   KETONESUR NEGATIVE 11/03/2015 2251   PROTEINUR NEGATIVE 11/03/2015 2251   PROTEINUR neg 10/27/2015 1202   UROBILINOGEN 0.2 10/27/2015 1202   NITRITE NEGATIVE 11/03/2015 2251   NITRITE neg 10/27/2015 1202   LEUKOCYTESUR NEGATIVE 11/03/2015 2251   Sepsis Labs: @LABRCNTIP (procalcitonin:4,lacticidven:4)  ) Recent Results (from the past 240 hour(s))  Urine culture     Status: None   Collection Time: 11/03/15  3:46 PM  Result Value Ref Range Status   Specimen Description URINE,  CLEAN CATCH  Final   Special Requests NONE  Final   Culture NO GROWTH 2 DAYS  Final   Report Status 11/05/2015 FINAL  Final      Radiology Studies: Ct Abdomen Pelvis Wo Contrast 11/03/2015   Over distended stomach with normalization at the pylorus. No identifiable cause for a gastric outlet obstruction.   Dg Chest 2 View 11/03/2015   No active cardiopulmonary disease.   Scheduled Meds: . feeding supplement (ENSURE ENLIVE)  237 mL Oral BID BM  . metoprolol succinate  50 mg Oral Daily  . pantoprazole (PROTONIX) IV  40 mg Intravenous Q12H  . sodium chloride flush  3 mL Intravenous Q12H  . sucralfate  1 g Oral TID WC & HS   Continuous Infusions: . sodium chloride 0.45 % 1,000 mL infusion 75 mL/hr at 11/07/15 1142     LOS: 4 days    Time spent: 25 minutes   Leisa Lenz, MD [facility] Pager [phone]  If 7PM-7AM, please contact night-coverage [email or web address] Password [email or web address] 11/07/2015, 2:07 PM

## 2015-11-07 NOTE — Progress Notes (Signed)
Patient ID: Bradley Stanley, male   DOB: 03-21-1933, 82 y.o.   MRN: [id number]    Progress Note   Subjective   feels much better- tolerating liquids, denies pain or nausea Says aspirin has been burning his stomach for a long time   Objective   Vital signs in last 24 hours: Temp:  [98 F (36.7 C)-99.3 F (37.4 C)] 98.2 F (36.8 C) (04/17 0515) Pulse Rate:  [59-75] 61 (04/17 0515) Resp:  [16-20] 20 (04/17 0515) BP: (94-140)/(31-63) 120/62 mmHg (04/17 0515) SpO2:  [91 %-100 %] 96 % (04/17 0515) FiO2 (%):  [2 %] 2 % (04/16 1448) Last BM Date: 11/05/15 General: elderly     white male in NAD Heart:  Regular rate and rhythm; no murmurs Lungs: Respirations even and unlabored, lungs CTA bilaterally Abdomen:  Soft, nontender and nondistended.  BS loud, no splash Extremities:  Without edema. Neurologic:  Alert and oriented,  grossly normal neurologically. Psych:  Cooperative. Normal mood and affect.  Intake/Output from previous day: 04/16 0701 - 04/17 0700 In: 700 [I.V.:700] Out: 250 [Urine:250] Intake/Output this shift: Total I/O In: -  Out: 300 [Urine:300]  Lab Results:  Recent Labs  11/05/15 0545 11/06/15 0544 11/07/15 0509  WBC 11.4* 14.4* 15.0*  HGB 11.8* 11.4* 10.9*  HCT 37.4* 36.2* 33.7*  PLT 252 259 228     Assessment / Plan:    #1 82 yo WM with nausea/vomiting /abd pain and weight loss secondary to acute esophagitis and partial GOO secondary to pyloric stenosis Lenna Sciara-  Bezoar like material removed at EGD yesterday 3.2liters!  Much better today Continue BID PPI carafate suspension QID between meals  Full liquid diet and Boost between meals- do not advance any further for now    LOS: 4 days   Amy Esterwood  11/07/2015, 9:10 AM    [facility] Attending   I have taken an interval history, reviewed the chart and examined the patient. I agree with the Advanced Practitioner's note, impression and recommendations.  Will reassess tomorrow - not sure if he will need  a dilation of pylorus. Hold Eliquis and asa   Gatha Mayer, MD, [facility] [phone] (pager) [phone] after 5 PM, weekends and holidays  11/07/2015 12:41 PM

## 2015-11-07 NOTE — Progress Notes (Addendum)
Brief Nutrition Follow-Up Note  Chart reviewed. Pt underwent EGD on 11/06/15, which revealed reflux esophagitis and partially obstructing gastric ulcers at the pylorus. Pt has been advanced to a full liquid diet. RD will order Ensure Enlive po BID, each supplement provides 350 kcal and 20 grams of protein, to optimize intake.   RD will continue to follow.   Dalanie Kisner A. Jimmye Norman, RD, LDN, CDE Pager: [phone] After hours Pager: [phone]

## 2015-11-07 NOTE — Progress Notes (Signed)
Pt has elevated QTc intervals ranging as high as 590s. Notified on-call MD. Pt resting in bed with eyes closed. Will continue to monitor.

## 2015-11-08 ENCOUNTER — Encounter (HOSPITAL_COMMUNITY): Payer: Self-pay | Admitting: Internal Medicine

## 2015-11-08 LAB — BASIC METABOLIC PANEL
Anion gap: 7 (ref 5–15)
BUN: 27 mg/dL — ABNORMAL HIGH (ref 6–20)
CHLORIDE: 102 mmol/L (ref 101–111)
CO2: 27 mmol/L (ref 22–32)
Calcium: 7.9 mg/dL — ABNORMAL LOW (ref 8.9–10.3)
Creatinine, Ser: 1.3 mg/dL — ABNORMAL HIGH (ref 0.61–1.24)
GFR, EST AFRICAN AMERICAN: 58 mL/min — AB (ref >60)
GFR, EST NON AFRICAN AMERICAN: 50 mL/min — AB (ref >60)
Glucose, Bld: 113 mg/dL — ABNORMAL HIGH (ref 65–99)
POTASSIUM: 3.6 mmol/L (ref 3.5–5.1)
SODIUM: 136 mmol/L (ref 135–145)

## 2015-11-08 LAB — CBC
HCT: 31.6 % — ABNORMAL LOW (ref 39.0–52.0)
HEMOGLOBIN: 10.6 g/dL — AB (ref 13.0–17.0)
MCH: 29.9 pg (ref 26.0–34.0)
MCHC: 33.5 g/dL (ref 30.0–36.0)
MCV: 89.3 fL (ref 78.0–100.0)
PLATELETS: 223 10*3/uL (ref 150–400)
RBC: 3.54 MIL/uL — AB (ref 4.22–5.81)
RDW: 15.3 % (ref 11.5–15.5)
WBC: 13.5 10*3/uL — AB (ref 4.0–10.5)

## 2015-11-08 MED ORDER — PANTOPRAZOLE SODIUM 40 MG PO TBEC: 40.0000 mg | DELAYED_RELEASE_TABLET | Freq: Two times a day (BID) | ORAL | Status: DC

## 2015-11-08 MED ORDER — FUROSEMIDE 40 MG PO TABS: 40.0000 mg | ORAL_TABLET | Freq: Every day | ORAL | Status: DC

## 2015-11-08 MED ORDER — SUCRALFATE 1 GM/10ML PO SUSP: 1.0000 g | Freq: Three times a day (TID) | ORAL | Status: DC

## 2015-11-08 NOTE — Progress Notes (Signed)
Nsg Discharge Note  Admit Date:  11/03/2015 Discharge date: 11/08/2015   Chancie Pennywell to be D/C'd Home per MD order.  AVS completed.  Copy for chart, and copy for patient signed, and dated. Patient/caregiver able to verbalize understanding.  Discharge Medication:   Medication List    STOP taking these medications        ciprofloxacin 500 MG tablet  Commonly known as:  CIPRO     lisinopril 10 MG tablet  Commonly known as:  PRINIVIL,ZESTRIL     omeprazole 20 MG capsule  Commonly known as:  PRILOSEC      TAKE these medications        aspirin 81 MG tablet  Take 81 mg by mouth daily.     atorvastatin 80 MG tablet  Commonly known as:  LIPITOR  Take 1 tablet (80 mg total) by mouth daily.     Cetirizine HCl 10 MG Caps  Take 10 mg by mouth daily.     doxazosin 4 MG tablet  Commonly known as:  CARDURA  Take 1 tablet (4 mg total) by mouth daily.     ELIQUIS 5 MG Tabs tablet  Generic drug:  apixaban  Take 5 mg by mouth 2 (two) times daily.     ferrous sulfate 325 (65 FE) MG EC tablet  Take 325 mg by mouth 3 (three) times daily with meals.     furosemide 40 MG tablet  Commonly known as:  LASIX  Take 1 tablet (40 mg total) by mouth daily.     LORazepam 0.5 MG tablet  Commonly known as:  ATIVAN  Take 1 tablet (0.5 mg total) by mouth every 8 (eight) hours as needed for anxiety.     metoprolol succinate 50 MG 24 hr tablet  Commonly known as:  TOPROL-XL  Take 50 mg by mouth daily. Take with or immediately following a meal.     multivitamin tablet  Take 1 tablet by mouth daily.     pantoprazole 40 MG tablet  Commonly known as:  PROTONIX  Take 1 tablet (40 mg total) by mouth 2 (two) times daily.     potassium chloride 20 MEQ packet  Commonly known as:  KLOR-CON  Take by mouth 2 (two) times daily.     sucralfate 1 GM/10ML suspension  Commonly known as:  CARAFATE  Take 10 mLs (1 g total) by mouth 4 (four) times daily -  with meals and at bedtime.     tamsulosin 0.4 MG  Caps capsule  Commonly known as:  FLOMAX  Take 1 capsule (0.4 mg total) by mouth daily.     varenicline 0.5 MG X 11 & 1 MG X 42 tablet  Commonly known as:  CHANTIX STARTING MONTH PAK  Take one 0.5mg  tablet by mouth once daily for 3 days, then increase to one 0.5mg  tablet twice daily for 3 days, then increase to one 1mg  tablet twice daily.        Discharge Assessment: Filed Vitals:   11/08/15 1355 11/08/15 1355  BP:  139/57  Pulse:  71  Temp: 98.3 F (36.8 C)   Resp:  16   Skin clean, dry and intact without evidence of skin break down, no evidence of skin tears noted. IV catheter discontinued intact. Site without signs and symptoms of complications - no redness or edema noted at insertion site, patient denies c/o pain - only slight tenderness at site.  Dressing with slight pressure applied.  D/c Instructions-Education: Discharge instructions given to  patient/family with verbalized understanding. D/c education completed with patient/family including follow up instructions, medication list, d/c activities limitations if indicated, with other d/c instructions as indicated by MD - patient able to verbalize understanding, all questions fully answered. Patient instructed to return to ED, call 911, or call MD for any changes in condition.  Patient escorted via [place], and D/C home via private auto.  Millan Legan Margaretha Sheffield, RN 11/08/2015 2:49 PM

## 2015-11-08 NOTE — Progress Notes (Signed)
          Daily Rounding Note  11/08/2015, 10:56 AM  LOS: 5 days   SUBJECTIVE:       Pt raring to discharge.  Tolerating fulls.  No nausea.  + formed BMs.  No abd pain or bloating  OBJECTIVE:         Vital signs in last 24 hours:    Temp:  [98.4 F (36.9 C)-99.2 F (37.3 C)] 98.6 F (37 C) (04/18 0459) Pulse Rate:  [52-73] 63 (04/18 0759) Resp:  [18] 18 (04/18 0459) BP: (112-147)/(55-73) 147/64 mmHg (04/18 0759) SpO2:  [95 %-100 %] 100 % (04/18 0459) Last BM Date: 11/07/15 Filed Weights   11/03/15 1436 11/04/15 0608  Weight: 60.328 kg (133 lb) 59.421 kg (131 lb)   General: looks well.     Heart: RRR Chest: clear bil.  Smokers hoarse voice Abdomen: soft, active BS, NT, ND  Extremities: no CCE Neuro/Psych:  Calm.  Cooperative.  No tremor or limb weakness.   Intake/Output from previous day: 04/17 0701 - 04/18 0700 In: 3012 [P.O.:1638; I.V.:900] Out: 1875 [Urine:1875]   Lab Results:  Recent Labs  11/06/15 0544 11/07/15 0509 11/08/15 0540  WBC 14.4* 15.0* 13.5*  HGB 11.4* 10.9* 10.6*  HCT 36.2* 33.7* 31.6*  PLT 259 228 223     ASSESMENT:   *  Acute esophagitis.  Partial GOO due to partially obstructing, non-bleeding gastric ulcer,  pyloric stenosis Lenna Sciara- Bezoar like material and 3.2 liters removed (fluid and plant material)  at EGD 2/16.  On BID Protonix IV, day 4; Carafate po day 3.  Full liquid diet day 2.   *  AKI.  Improved.   *  Chronic Eliquis since 04/2015 for post CABG a fib.    PLAN   *  H Pylori Ab.  Add abx if tolerated.   *  Advance diet to soft/low fiber.     *  Switch to oral BID PPI.  ? Need for ongoing Carafate.    *  ? When/if Eliquis restarts.  Dr Stanford Breed scheduled outpt echo and abd ultrasound (screen for AAA) on 4/19 but this now postponed til 5/10.  Pt indicates Dr C is basing continuation of the Abixiban on the echo findings.  Pt in NSR,  Not on monitor.  Cardiology has not been  involved in pt's inpt care.  As pt is not bleeding and clinically is not in need of relook EGD while inpt, OK from GI view to restart this.   *  GI office will be contacting pt with appointment time.  Will be seen within next 3 to 4 weeks, by APP.      Azucena Freed  11/08/2015, 10:56 AM Pager: [phone]  Agree with Ms. Sandi Carne assessment and plan. Gatha Mayer, MD, [facility]

## 2015-11-08 NOTE — Progress Notes (Signed)
UR COMPLETED  

## 2015-11-08 NOTE — Discharge Summary (Signed)
Physician Discharge Summary  Bradley Stanley [id number] DOB: 1932/11/08 DOA: 11/03/2015  PCP: Lynne Leader, MD  Admit date: 11/03/2015 Discharge date: 11/08/2015  Recommendations for Outpatient Follow-up:  Continue protonix and sucralfate on discharge Resume Aixaban Resume lasix but hold lisinopril as your renal function still not at normal limit. Check with your PCP to repeat renal function and if stable then you may resume lisinopril  Discharge Diagnoses:  Principal Problem:   Nausea and vomiting Active Problems:   Essential hypertension   Peripheral vascular disease (HCC)   Hypokalemia   Leukocytosis   CKD (chronic kidney disease) stage 4, GFR 15-29 ml/min (HCC)   Paroxysmal atrial fibrillation (HCC)   Dyslipidemia   Protein-calorie malnutrition, severe   Gastric outlet obstruction   Multiple gastric ulcers   Pyloric stenosis, acquired    Discharge Condition: stable   Diet recommendation: as tolerated   History of present illness:  82 y.o. male with a past medical history significant for CAD s/p CABG x4 in 2016, HTN, PVD, HTN, atrial fibrillation on apixaban, and GERD who presented to [facility] ED with non bloody vomiting and poor po intake for past 3 days prior to this admission. No fevers, no diarrhea. He had mild abdominal pain on the right side which has improved since admission.   He was hemodynamically stable on admission. CT of the abdomen and pelvis with contrast showed a distended stomach, with ?semi-solid ingested material traversing the pylorus, no obvious obstructing mass and no SBO.He had HG tube placed but he removed it himself due to discomfort.   EGD done by Dr. Hilarie Fredrickson 11/06/2015 - Moderately severe reflux esophagitis. Excessive gastric fluid. Fluid aspiration performed. A large amount of food (residue) in the stomach. Partially obstructing non-bleeding gastric ulcers with no stigmata of bleeding at pylorus. Gastric stenosis was found at the pylorus with  edema/inflammation and plant material causing obstruction. Plant material removed. Pylorus traversed with pediatric endoscope. Dilation not performed today given Eliquis last dose 4/15.Mild bulbar duodenitis. Normal second portion of the duodenum.   Hospital Course:   Assessment & Plan:  Principal Problem:  Nausea and vomiting - EGD by Dr. Hilarie Fredrickson 11/06/2015 - Moderately severe reflux esophagitis. Excessive gastric fluid. Fluid aspiration performed. A large amount of food (residue) in the stomach. Partially obstructing non-bleeding gastric ulcers with no stigmata of bleeding at pylorus. Gastric stenosis was found at the pylorus with edema/inflammation and plant material causing obstruction. Plant material removed. Pylorus traversed with pediatric endoscope. Dilation not performed given Eliquis last dose 4/15. - Continue protonix IV twice daily  - Appreciate GI following - Clears today, not advancing further per GI   Active Problems:  Essential hypertension - Continue metoprolol    Peripheral vascular disease (HCC) - Resume asa and lasix   Hypokalemia - Due to GI losses, lasix - Supplemented    Hypernatremia  - Due to dehydration - Sodium normalized with 1/2 NS   Leukocytosis - Likely reactive   CKD (chronic kidney disease) stage 4, GFR 15-29 ml/min (HCC) - No previous values in EPIC for comparison but considering anemia pt probably has degree of CKD - Stopped lisinopril and lasix since they could have contributed to renal insufficiency - Cr 3.06 --> 2.39 --> 2.08 --> 1.85 --> 1.43; better with fluids  - May resume lasix but continue to hold lisinopril on discharge to minimize nephrotoxic meds    Paroxysmal atrial fibrillation (HCC) - CHADs vasc score 4 (age, gender, hypertension, PVD) - On Anticoagulation with apixaban, may resume on discharge   -  Rate controlled with metoprolol   Dyslipidemia - Resume atorvastatin 80 mg at bedtime on discharge   DVT  prophylaxis: Apixaban but now on hold in case pylorus dilatation is needed Code Status: full code Family Communication: wife at the bedside     Consultants:   Gastroenterology, Dr. Ulice Dash Pyrtle  Procedures:  EGD by Dr. Hilarie Fredrickson 11/06/2015 - Moderately severe reflux esophagitis. Excessive gastric fluid. Fluid aspiration performed. A large amount of food (residue) in the stomach. Partially obstructing non-bleeding gastric ulcers with no stigmata of bleeding at pylorus. Gastric stenosis was found at the pylorus with edema/inflammation and plant material causing obstruction. Plant material removed. Pylorus traversed with pediatric endoscope. Dilation not performed today given Eliquis last dose 4/15.Mild bulbar duodenitis. Normal second portion of the duodenum.   Antimicrobials:   None   Signed:  Leisa Lenz, MD  [facility] 11/08/2015, 12:12 PM  Pager #: [phone]  Time spent in minutes: more than 30 minutes    Discharge Exam: Filed Vitals:   11/08/15 0459 11/08/15 0759  BP: 123/68 147/64  Pulse: 52 63  Temp: 98.6 F (37 C)   Resp: 18    Filed Vitals:   11/07/15 1430 11/07/15 2148 11/08/15 0459 11/08/15 0759  BP: 112/55 135/73 123/68 147/64  Pulse: 69 68 52 63  Temp: 98.4 F (36.9 C) 99.2 F (37.3 C) 98.6 F (37 C)   TempSrc: Oral Oral Oral   Resp: 18 18 18    Height:      Weight:      SpO2: 95% 99% 100%     General: Pt is alert, follows commands appropriately, not in acute distress Cardiovascular: Regular rate and rhythm, S1/S2 +, no murmurs Respiratory: Clear to auscultation bilaterally, no wheezing, no crackles, no rhonchi Abdominal: Soft, non tender, non distended, bowel sounds +, no guarding Extremities: no edema, no cyanosis, pulses palpable bilaterally DP and PT Neuro: Grossly nonfocal  Discharge Instructions  Discharge Instructions    Call MD for:  difficulty breathing, headache or visual disturbances    Complete by:  As directed       Call MD for:  persistant dizziness or light-headedness    Complete by:  As directed      Call MD for:  persistant nausea and vomiting    Complete by:  As directed      Call MD for:  severe uncontrolled pain    Complete by:  As directed      Diet - low sodium heart healthy    Complete by:  As directed      Discharge instructions    Complete by:  As directed   Continue protonix and sucralfate on discharge Resume Aixaban Resume lasix but hold lisinopril as your renal function still not at normal limit. Check with your PCP to repeat renal function and if stable then you may resume lisinopril     Increase activity slowly    Complete by:  As directed             Medication List    STOP taking these medications        ciprofloxacin 500 MG tablet  Commonly known as:  CIPRO     lisinopril 10 MG tablet  Commonly known as:  PRINIVIL,ZESTRIL     omeprazole 20 MG capsule  Commonly known as:  PRILOSEC      TAKE these medications        aspirin 81 MG tablet  Take 81 mg by mouth daily.  atorvastatin 80 MG tablet  Commonly known as:  LIPITOR  Take 1 tablet (80 mg total) by mouth daily.     Cetirizine HCl 10 MG Caps  Take 10 mg by mouth daily.     doxazosin 4 MG tablet  Commonly known as:  CARDURA  Take 1 tablet (4 mg total) by mouth daily.     ELIQUIS 5 MG Tabs tablet  Generic drug:  apixaban  Take 5 mg by mouth 2 (two) times daily.     ferrous sulfate 325 (65 FE) MG EC tablet  Take 325 mg by mouth 3 (three) times daily with meals.     furosemide 40 MG tablet  Commonly known as:  LASIX  Take 1 tablet (40 mg total) by mouth daily.     LORazepam 0.5 MG tablet  Commonly known as:  ATIVAN  Take 1 tablet (0.5 mg total) by mouth every 8 (eight) hours as needed for anxiety.     metoprolol succinate 50 MG 24 hr tablet  Commonly known as:  TOPROL-XL  Take 50 mg by mouth daily. Take with or immediately following a meal.     multivitamin tablet  Take 1 tablet by mouth  daily.     pantoprazole 40 MG tablet  Commonly known as:  PROTONIX  Take 1 tablet (40 mg total) by mouth 2 (two) times daily.     potassium chloride 20 MEQ packet  Commonly known as:  KLOR-CON  Take by mouth 2 (two) times daily.     sucralfate 1 GM/10ML suspension  Commonly known as:  CARAFATE  Take 10 mLs (1 g total) by mouth 4 (four) times daily -  with meals and at bedtime.     tamsulosin 0.4 MG Caps capsule  Commonly known as:  FLOMAX  Take 1 capsule (0.4 mg total) by mouth daily.     varenicline 0.5 MG X 11 & 1 MG X 42 tablet  Commonly known as:  CHANTIX STARTING MONTH PAK  Take one 0.5mg  tablet by mouth once daily for 3 days, then increase to one 0.5mg  tablet twice daily for 3 days, then increase to one 1mg  tablet twice daily.           Follow-up Information    Follow up with PYRTLE, Lajuan Lines, MD.   Specialty:  Gastroenterology   Why:  Dr Vena Rua office will call you with a follow up appointment once schedule is released.  this will be with one of Dr Vena Rua PAs.    Contact information:   [address] [place] [address] [phone]       Follow up with Lynne Leader, MD. Schedule an appointment as soon as possible for a visit in 1 week.   Specialties:  Family Medicine, Emergency Medicine   Why:  Follow up appt after recent hospitalization   Contact information:   [address] [place] [address] [phone]        The results of significant diagnostics from this hospitalization (including imaging, microbiology, ancillary and laboratory) are listed below for reference.    Significant Diagnostic Studies: Ct Abdomen Pelvis Wo Contrast  11/03/2015  CLINICAL DATA:  Vomiting and constipation for 3 days. EXAM: CT ABDOMEN AND PELVIS WITHOUT CONTRAST TECHNIQUE: Multidetector CT imaging of the abdomen and pelvis was performed following the standard protocol without IV contrast. COMPARISON:  None. FINDINGS: Lower chest and abdominal wall: Status  post low abdominal wall hernia repair. Extensive atherosclerosis, including the coronary arteries. No  pneumonia or aspiration seen in the lower lungs. Hepatobiliary: Lobulated cyst in the anterior right lobe measuring up to 3 cm.High-density material in the gallbladder without discrete calcified stone. No superimposed inflammatory changes Pancreas: Unremarkable. Spleen: Granulomatous changes Adrenals/Urinary Tract: Negative adrenals. Renal hilar calcifications are elongated and likely vascular. There is smooth symmetric bilateral renal atrophy. No hydronephrosis Unremarkable bladder. Reproductive:Symmetric enlargement of the prostate projecting into the bladder base Stomach/Bowel: Dilated stomach with fluid levels, with normalization of luminal diameter at the level of the pylorus. Semi-solid/ingested material appears to traverse the pylorus. There is no associated volvulus. No visible perforation, inflammation, bezoar or pneumatosis. Probable appendectomy. Vascular/Lymphatic: Extensive atherosclerosis. No acute vascular abnormality. No mass or adenopathy. Peritoneal: No ascites or pneumoperitoneum. Musculoskeletal: No acute abnormalities. Usual degenerative changes for age. IMPRESSION: Over distended stomach with normalization at the pylorus. No identifiable cause for a gastric outlet obstruction. Electronically Signed   By: Monte Fantasia M.D.   On: 11/03/2015 19:43   Dg Chest 2 View  11/03/2015  CLINICAL DATA:  Cough for 4 days EXAM: CHEST  2 VIEW COMPARISON:  None. FINDINGS: The heart size and mediastinal contours are within normal limits. Both lungs are clear. The visualized skeletal structures are unremarkable. IMPRESSION: No active cardiopulmonary disease. Electronically Signed   By: Inez Catalina M.D.   On: 11/03/2015 15:17   Dg Knee 1-2 Views Right  10/31/2015  CLINICAL DATA:  Left knee pain for the past week after being on the knee is while gardening ; right knee series for comparison. EXAM: RIGHT  KNEE - 1-2 VIEW; LEFT KNEE - COMPLETE 4+ VIEW COMPARISON:  None in PACs FINDINGS: The bones are adequately mineralized. There is no acute or healing fracture. There is narrowing of the medial joint compartments bilaterally slightly greater on the left than on the right. There is beaking of the tibial spines. There arm vascular clips in the soft tissues over the medial aspect of the right knee. There is no joint effusion or chondrocalcinosis. There spurs arising from the superior and lateral margins of the left patella. IMPRESSION: Moderate ostial arthritic joint space loss of the medial compartments bilaterally greater on the left than on the right. Mild degenerative change of the left patellofemoral joint. There is no acute bony abnormality. Electronically Signed   By: David  Martinique M.D.   On: 10/31/2015 13:59   Dg Knee Complete 4 Views Left  10/31/2015  CLINICAL DATA:  Left knee pain for the past week after being on the knee is while gardening ; right knee series for comparison. EXAM: RIGHT KNEE - 1-2 VIEW; LEFT KNEE - COMPLETE 4+ VIEW COMPARISON:  None in PACs FINDINGS: The bones are adequately mineralized. There is no acute or healing fracture. There is narrowing of the medial joint compartments bilaterally slightly greater on the left than on the right. There is beaking of the tibial spines. There arm vascular clips in the soft tissues over the medial aspect of the right knee. There is no joint effusion or chondrocalcinosis. There spurs arising from the superior and lateral margins of the left patella. IMPRESSION: Moderate ostial arthritic joint space loss of the medial compartments bilaterally greater on the left than on the right. Mild degenerative change of the left patellofemoral joint. There is no acute bony abnormality. Electronically Signed   By: David  Martinique M.D.   On: 10/31/2015 13:59    Microbiology: Recent Results (from the past 240 hour(s))  Urine culture     Status: None   Collection  Time: 11/03/15  3:46 PM  Result Value Ref Range Status   Specimen Description URINE, CLEAN CATCH  Final   Special Requests NONE  Final   Culture NO GROWTH 2 DAYS  Final   Report Status 11/05/2015 FINAL  Final     Labs: Basic Metabolic Panel:  Recent Labs Lab 11/04/15 0358 11/05/15 0545 11/06/15 0544 11/07/15 0509 11/08/15 0540  NA 144 147* 143 136 136  K 2.8* 3.5 3.0* 3.2* 3.6  CL 92* 97* 95* 97* 102  CO2 38* 37* 34* 29 27  GLUCOSE 116* 114* 95 101* 113*  BUN 62* 51* 48* 34* 27*  CREATININE 2.39* 2.08* 1.85* 1.43* 1.30*  CALCIUM 8.4* 8.8* 8.7* 7.9* 7.9*   Liver Function Tests:  Recent Labs Lab 11/03/15 1448 11/04/15 0358 11/07/15 0509  AST 48* 38 29  ALT 71* 56 42  ALKPHOS 79 60 51  BILITOT 0.6 0.5 0.5  PROT 7.3 6.2* 5.1*  ALBUMIN 4.2 3.3* 2.8*   No results for input(s): LIPASE, AMYLASE in the last 168 hours. No results for input(s): AMMONIA in the last 168 hours. CBC:  Recent Labs Lab 11/03/15 1448  11/04/15 0358 11/05/15 0545 11/06/15 0544 11/07/15 0509 11/08/15 0540  WBC 13.8*  < > 11.7* 11.4* 14.4* 15.0* 13.5*  NEUTROABS 10.3*  --   --   --   --   --   --   HGB 13.5  < > 11.1* 11.8* 11.4* 10.9* 10.6*  HCT 41.0  < > 34.6* 37.4* 36.2* 33.7* 31.6*  MCV 90.7  < > 91.5 94.2 91.9 89.9 89.3  PLT 335  < > 265 252 259 228 223  < > = values in this interval not displayed. Cardiac Enzymes: No results for input(s): CKTOTAL, CKMB, CKMBINDEX, TROPONINI in the last 168 hours. BNP: BNP (last 3 results) No results for input(s): BNP in the last 8760 hours.  ProBNP (last 3 results) No results for input(s): PROBNP in the last 8760 hours.  CBG: No results for input(s): GLUCAP in the last 168 hours.

## 2015-11-08 NOTE — Discharge Instructions (Signed)
Sucralfate oral suspension What is this medicine? SUCRALFATE (SOO kral fate) helps to treat ulcers of the intestine. This medicine may be used for other purposes; ask your health care provider or pharmacist if you have questions. What should I tell my health care provider before I take this medicine? They need to know if you have any of these conditions: -kidney disease -an unusual or allergic reaction to sucralfate, other medicines, foods, dyes, or preservatives -pregnant or trying to get pregnant -breast-feeding How should I use this medicine? Take this medicine by mouth with a glass of water. Follow the directions on the prescription label. Shake well before using. Use a specially marked spoon or container to measure your medicine. Ask your pharmacist if you do not have one. Household spoons are not accurate. This medicine works best if you take it on an empty stomach, 1 hour before meals. Take your doses at regular intervals. Do not take your medicine more often than directed. Do not stop taking except on your doctor's advice. Talk to your pediatrician regarding the use of this medicine in children. Special care may be needed. Overdosage: If you think you have taken too much of this medicine contact a poison control center or emergency room at once. NOTE: This medicine is only for you. Do not share this medicine with others. What if I miss a dose? If you miss a dose, take it as soon as you can. If it is almost time for your next dose, take only that dose. Do not take double or extra doses. What may interact with this medicine? -antacid -cimetidine -digoxin -ketoconazole -phenytoin -quinidine -ranitidine -some antibiotics like ciprofloxacin, norfloxacin, and ofloxacin -theophylline -thyroid hormones -warfarin This list may not describe all possible interactions. Give your health care provider a list of all the medicines, herbs, non-prescription drugs, or dietary supplements you use.  Also tell them if you smoke, drink alcohol, or use illegal drugs. Some items may interact with your medicine. What should I watch for while using this medicine? Visit your doctor or health care professional for regular check ups. Let your doctor know if your symptoms do not improve or if you feel worse. Antacids should not be taken within one half hour before or after this medicine. What side effects may I notice from receiving this medicine? Side effects that you should report to your doctor or health care professional as soon as possible: -allergic reactions like skin rash, itching or hives, swelling of the face, lips, or tongue -difficulty breathing Side effects that usually do not require medical attention (report to your doctor or health care professional if they continue or are bothersome): -back pain -constipation -drowsy, dizzy -dry mouth -headache -stomach upset, gas -trouble sleeping This list may not describe all possible side effects. Call your doctor for medical advice about side effects. You may report side effects to FDA at 1-800-FDA-1088. Where should I keep my medicine? Keep out of the reach of children. Store at room temperature between 20 and 25 degrees C (68 and 77 degrees F). Keep container tightly closed. Throw away any unused medicine after the expiration date. NOTE: This sheet is a summary. It may not cover all possible information. If you have questions about this medicine, talk to your doctor, pharmacist, or health care provider.    2016, Elsevier/Gold Standard. (2008-03-10 15:47:18) Pantoprazole tablets What is this medicine? PANTOPRAZOLE (pan TOE pra zole) prevents the production of acid in the stomach. It is used to treat gastroesophageal reflux disease (GERD), inflammation  of the esophagus, and Zollinger-Ellison syndrome. This medicine may be used for other purposes; ask your health care provider or pharmacist if you have questions. What should I tell my  health care provider before I take this medicine? They need to know if you have any of these conditions: -liver disease -low levels of magnesium in the blood -an unusual or allergic reaction to omeprazole, lansoprazole, pantoprazole, rabeprazole, other medicines, foods, dyes, or preservatives -pregnant or trying to get pregnant -breast-feeding How should I use this medicine? Take this medicine by mouth. Swallow the tablets whole with a drink of water. Follow the directions on the prescription label. Do not crush, break, or chew. Take your medicine at regular intervals. Do not take your medicine more often than directed. Talk to your pediatrician regarding the use of this medicine in children. While this drug may be prescribed for children as young as 5 years for selected conditions, precautions do apply. Overdosage: If you think you have taken too much of this medicine contact a poison control center or emergency room at once. NOTE: This medicine is only for you. Do not share this medicine with others. What if I miss a dose? If you miss a dose, take it as soon as you can. If it is almost time for your next dose, take only that dose. Do not take double or extra doses. What may interact with this medicine? Do not take this medicine with any of the following medications: -atazanavir -nelfinavir This medicine may also interact with the following medications: -ampicillin -delavirdine -erlotinib -iron salts -medicines for fungal infections like ketoconazole, itraconazole and voriconazole -methotrexate -mycophenolate mofetil -warfarin This list may not describe all possible interactions. Give your health care provider a list of all the medicines, herbs, non-prescription drugs, or dietary supplements you use. Also tell them if you smoke, drink alcohol, or use illegal drugs. Some items may interact with your medicine. What should I watch for while using this medicine? It can take several days  before your stomach pain gets better. Check with your doctor or health care professional if your condition does not start to get better, or if it gets worse. You may need blood work done while you are taking this medicine. What side effects may I notice from receiving this medicine? Side effects that you should report to your doctor or health care professional as soon as possible: -allergic reactions like skin rash, itching or hives, swelling of the face, lips, or tongue -bone, muscle or joint pain -breathing problems -chest pain or chest tightness -dark yellow or brown urine -dizziness -fast, irregular heartbeat -feeling faint or lightheaded -fever or sore throat -muscle spasm -palpitations -redness, blistering, peeling or loosening of the skin, including inside the mouth -seizures -tremors -unusual bleeding or bruising -unusually weak or tired -yellowing of the eyes or skin Side effects that usually do not require medical attention (Report these to your doctor or health care professional if they continue or are bothersome.): -constipation -diarrhea -dry mouth -headache -nausea This list may not describe all possible side effects. Call your doctor for medical advice about side effects. You may report side effects to FDA at 1-800-FDA-1088. Where should I keep my medicine? Keep out of the reach of children. Store at room temperature between 15 and 30 degrees C (59 and 86 degrees F). Protect from light and moisture. Throw away any unused medicine after the expiration date. NOTE: This sheet is a summary. It may not cover all possible information. If you have questions  about this medicine, talk to your doctor, pharmacist, or health care provider.    2016, Elsevier/Gold Standard. (2014-08-27 14:45:56)

## 2015-11-08 NOTE — Progress Notes (Signed)
Nutrition Follow-up  DOCUMENTATION CODES:   Severe malnutrition in context of chronic illness  INTERVENTION:   -Continue Ensure Enlive po BID, each supplement provides 350 kcal and 20 grams of protein  NUTRITION DIAGNOSIS:   Malnutrition related to chronic illness as evidenced by severe depletion of body fat, severe depletion of muscle mass.  Ongoing  GOAL:   Patient will meet greater than or equal to 90% of their needs  Progressing  MONITOR:   PO intake, Supplement acceptance, Diet advancement, Labs, Weight trends, Skin, I & O's  REASON FOR ASSESSMENT:   Malnutrition Screening Tool    ASSESSMENT:   Bradley Stanley is a 82 y.o. male with a past medical history significant for CAD s/p CABG x4 in 2016, HTN, PVD, HTN, pAF on apixaban, and GERD who presents with vomiting and AKI.  Pt underwent EGD on 11/06/15, which revealed reflux esophagitis and partially obstructing gastric ulcers at the pylorus.   Pt bathing at time of visit. Case discussed with RN. She confirmed that pt is tolerating full liquid diet well; noted plans to advance diet to soft diet for lunch, with potential discharge today (RN reports pt is extremely eager to return home today). RN reports pt is taking Ensure Enlive supplements well; requested strawberry flavored Ensure after breakfast, which RN provided.   Labs reviewed.   Diet Order:  DIET SOFT Room service appropriate?: Yes; Fluid consistency:: Thin  Skin:  Reviewed, no issues  Last BM:  11/07/15  Height:   Ht Readings from Last 1 Encounters:  11/04/15 5\' 9"  (1.753 m)    Weight:   Wt Readings from Last 1 Encounters:  11/04/15 131 lb (59.421 kg)    Ideal Body Weight:  72.7 kg  BMI:  Body mass index is 19.34 kg/(m^2).  Estimated Nutritional Needs:   Kcal:  1600-1800  Protein:  75-90 grams  Fluid:  1.6-1.8 L  EDUCATION NEEDS:   Education needs addressed  Bradley Stanley A. Jimmye Norman, RD, LDN, CDE Pager: [phone] After hours Pager:  [phone]

## 2015-11-09 ENCOUNTER — Ambulatory Visit (HOSPITAL_BASED_OUTPATIENT_CLINIC_OR_DEPARTMENT_OTHER): Payer: Medicare Other

## 2015-11-09 LAB — H PYLORI, IGM, IGG, IGA AB: H Pylori IgG: <0.9 U/mL (ref 0.0–0.8)

## 2015-11-11 ENCOUNTER — Telehealth: Payer: Self-pay | Admitting: Cardiology

## 2015-11-11 NOTE — Telephone Encounter (Signed)
Pt of Dr. Stanford Breed Hx PAF  Recent discharge from hospital for GI admission - no cardiology consult. Discharged w/ med changes - no changes to antiarrhythmics.  Spoke to wife, listed DPR contact. Pt c/o SOB on exertion only, HR upper 90s to 110 when checked.  No orthopnea or PND endorsed, no sleep issues. BP 115/58 today, HR 96. Baseline closer to 51 - wife notes "Heart rate is never that fast".  Pt took his 50mg  metoprolol today. Advised extra 1/2 metoprolol succ (25mg ) today, will call to see if recommended to get in for flex visit.  Spoke w/ Roderic Palau, NP - confirmed directions as given were appropriate, advised to r/o HF/fluid load concerns, have pt f/u in A Fib clinic Monday AM. Advised to cut cardura in half if BP runs low & continue extra metoprolol over weekend.  Returned call. These instructions given to caller & appt information communicated.  I also made sure caller was aware of ED eval or after-hours on-call service if more urgent issues over weekend. She expressed thanks and understanding of advice given.

## 2015-11-11 NOTE — Telephone Encounter (Signed)
New message    Wife calling     patient in hospital last week for another problem .   C/O high heart rate on yesterday  96. exteremly  tired and laethargic

## 2015-11-14 ENCOUNTER — Other Ambulatory Visit: Payer: Self-pay

## 2015-11-14 ENCOUNTER — Ambulatory Visit (HOSPITAL_COMMUNITY)
Admission: RE | Admit: 2015-11-14 | Discharge: 2015-11-14 | Disposition: A | Discharge status: Home / Self Care | Payer: Medicare Other | Source: Ambulatory Visit | Attending: Nurse Practitioner | Admitting: Nurse Practitioner

## 2015-11-14 ENCOUNTER — Telehealth: Payer: Self-pay

## 2015-11-14 ENCOUNTER — Encounter (HOSPITAL_COMMUNITY): Payer: Self-pay | Admitting: Nurse Practitioner

## 2015-11-14 VITALS — BP 148/76 | HR 84 | Ht 69.0 in | Wt 136.4 lb

## 2015-11-14 DIAGNOSIS — Z7901 Long term (current) use of anticoagulants: Secondary | ICD-10-CM | POA: Diagnosis not present

## 2015-11-14 DIAGNOSIS — K219 Gastro-esophageal reflux disease without esophagitis: Secondary | ICD-10-CM | POA: Diagnosis not present

## 2015-11-14 DIAGNOSIS — Z951 Presence of aortocoronary bypass graft: Secondary | ICD-10-CM | POA: Diagnosis not present

## 2015-11-14 DIAGNOSIS — I480 Paroxysmal atrial fibrillation: Secondary | ICD-10-CM

## 2015-11-14 DIAGNOSIS — E7800 Pure hypercholesterolemia, unspecified: Secondary | ICD-10-CM | POA: Insufficient documentation

## 2015-11-14 DIAGNOSIS — I252 Old myocardial infarction: Secondary | ICD-10-CM | POA: Diagnosis not present

## 2015-11-14 DIAGNOSIS — Z8249 Family history of ischemic heart disease and other diseases of the circulatory system: Secondary | ICD-10-CM | POA: Insufficient documentation

## 2015-11-14 DIAGNOSIS — Z833 Family history of diabetes mellitus: Secondary | ICD-10-CM | POA: Insufficient documentation

## 2015-11-14 DIAGNOSIS — I2510 Atherosclerotic heart disease of native coronary artery without angina pectoris: Secondary | ICD-10-CM | POA: Insufficient documentation

## 2015-11-14 DIAGNOSIS — I10 Essential (primary) hypertension: Secondary | ICD-10-CM | POA: Insufficient documentation

## 2015-11-14 DIAGNOSIS — Z79899 Other long term (current) drug therapy: Secondary | ICD-10-CM | POA: Insufficient documentation

## 2015-11-14 DIAGNOSIS — F17210 Nicotine dependence, cigarettes, uncomplicated: Secondary | ICD-10-CM | POA: Insufficient documentation

## 2015-11-14 DIAGNOSIS — I4891 Unspecified atrial fibrillation: Secondary | ICD-10-CM | POA: Diagnosis present

## 2015-11-14 DIAGNOSIS — G4733 Obstructive sleep apnea (adult) (pediatric): Secondary | ICD-10-CM

## 2015-11-14 MED ORDER — ASPIRIN EC 81 MG PO TBEC: 81.0000 mg | DELAYED_RELEASE_TABLET | Freq: Every day | ORAL | Status: DC

## 2015-11-14 MED ORDER — METOPROLOL SUCCINATE ER 50 MG PO TB24: ORAL_TABLET | ORAL | Status: DC

## 2015-11-14 NOTE — Patient Instructions (Signed)
Your physician has recommended you make the following change in your medication:  1)Restart Aspirin 81mg  once a day 2)Metoprolol 50mg  in the morning and 25mg  in the evening  Try to decrease alcohol intake and continue working on quitting smoking.  Sleep study scheduler will be in touch with you to schedule.

## 2015-11-14 NOTE — Telephone Encounter (Signed)
Called patient to let him know his information was given to the sleep lab. Patient unavailable to speak, but asked to call back later or tomorrow. If patient calls back regarding sleep study please let patient know that the sleep lab will schedule his appt and send him a packet of information with the date and time.

## 2015-11-15 ENCOUNTER — Encounter (HOSPITAL_COMMUNITY): Payer: Self-pay | Admitting: Nurse Practitioner

## 2015-11-15 NOTE — Progress Notes (Signed)
Patient ID: Bradley Stanley, male   DOB: 12-May-1933, 82 y.o.   MRN: [id number]    Primary Care Physician: Bradley Leader, MD Referring Physician:   Cyrie Stanley is a 82 y.o. male with a h/o CAD, s/p bypass,10/16, tobacco abuse, HTN, PAF that is here today to f/u call in to [facility] triage on Friday with increased shortness of breath and elevated heart rate. This was felt to probably be afib and BB was increased. He also has h/o recent hospitalization for GI complaints,N/V, with d/c dx of gastric outlet obstruction multiple gastric ulcers without bleeding, malnutrition,and was d/c home on sulcralfate/protonix. GI issues improved.He did not have any afib while in the hospital and after BB was increased he feels that he returned to SR the next day and EKG confirms this today. He did stop asa on his own but encouraged with  MI last fall and bypass to start back. GI did approve restart of asa and eliquis.  He does drink alcohol and we discussed that could be a trigger for afib. He has cut back on cigarettes since bypass surgery but he still smokes around 5 a day and we also discussed that smoking can be a trigger for afib as well.He does have some s/s of sleep apnea. [place] and he states that he played a lot of golf there and would walk a lot of the way. He has not gotten in a routine here since bypass and feels like he is not as active as he used to be and this is depressing to him. He did have afib right after bypass but it has been very infrequent.  Today, he denies symptoms of palpitations, chest pain, shortness of breath, orthopnea, PND, lower extremity edema, dizziness, presyncope, syncope, or neurologic sequela. The patient is tolerating medications without difficulties and is otherwise without complaint today.   Past Medical History  Diagnosis Date  . High cholesterol   . Hypertension   . Renal insufficiency   . BPH (benign prostatic hyperplasia)   .  GERD (gastroesophageal reflux disease)   . CAD (coronary artery disease)   . Heart murmur   . Myocardial infarction ([place]) 04/2015  . Pneumonia 05/2015    "after heart surgery"  . Chronic bronchitis ([place])   . Anxiety     "occasionally" (11/03/2015)  . A-fib ([place]) 04/2015    after CABG.  Abixiban initiated.    Past Surgical History  Procedure Laterality Date  . Cataract extraction w/ intraocular lens  implant, bilateral    . Coronary artery bypass graft  04/2015    CABG "X4; [place]"  . Inguinal hernia repair Bilateral   . Hand reconstruction Bilateral 1970s    "tore up by garage door spring"  . Esophagogastroduodenoscopy N/A 11/06/2015    Procedure: ESOPHAGOGASTRODUODENOSCOPY (EGD);  Surgeon: Bradley Bears, MD;  Location: [facility] ENDOSCOPY;  Service: Endoscopy;  Laterality: N/A;    Current Outpatient Prescriptions  Medication Sig Dispense Refill  . apixaban (ELIQUIS) 5 MG TABS tablet Take 5 mg by mouth 2 (two) times daily.    Marland Kitchen atorvastatin (LIPITOR) 80 MG tablet Take 1 tablet (80 mg total) by mouth daily. 90 tablet 3  . Cetirizine HCl 10 MG CAPS Take 10 mg by mouth daily.    Marland Kitchen doxazosin (CARDURA) 4 MG tablet Take 1 tablet (4 mg total) by mouth daily. 90 tablet 2  . ferrous sulfate 325 (65 FE) MG EC tablet Take 325  mg by mouth daily with breakfast.     . furosemide (LASIX) 40 MG tablet Take 1 tablet (40 mg total) by mouth daily. 30 tablet 0  . LORazepam (ATIVAN) 0.5 MG tablet Take 1 tablet (0.5 mg total) by mouth every 8 (eight) hours as needed for anxiety. 30 tablet 0  . metoprolol succinate (TOPROL-XL) 50 MG 24 hr tablet Take 1 tablet (50mg ) by mouth in the morning and 1/2 tablet (25mg )  in the evening. Take with or immediately following a meal. 45 tablet 6  . Multiple Vitamin (MULTIVITAMIN) tablet Take 1 tablet by mouth daily.    . pantoprazole (PROTONIX) 40 MG tablet Take 1 tablet (40 mg total) by mouth 2 (two) times daily. 60 tablet 0  . potassium chloride (KLOR-CON) 20 MEQ packet  Take by mouth 2 (two) times daily.    . sucralfate (CARAFATE) 1 GM/10ML suspension Take 10 mLs (1 g total) by mouth 4 (four) times daily -  with meals and at bedtime. 420 mL 0  . tamsulosin (FLOMAX) 0.4 MG CAPS capsule Take 1 capsule (0.4 mg total) by mouth daily. 90 capsule 3  . aspirin EC 81 MG tablet Take 1 tablet (81 mg total) by mouth daily.    . varenicline (CHANTIX STARTING MONTH PAK) 0.5 MG X 11 & 1 MG X 42 tablet Take one 0.5mg  tablet by mouth once daily for 3 days, then increase to one 0.5mg  tablet twice daily for 3 days, then increase to one 1mg  tablet twice daily. (Patient not taking: Reported on 11/03/2015) 53 tablet 0   No current facility-administered medications for this encounter.    No Known Allergies  Social History   Social History  . Marital Status: Married    Spouse Name: N/A  . Number of Children: 3  . Years of Education: N/A   Occupational History  . Not on file.   Social History Main Topics  . Smoking status: Current Every Day Smoker -- 0.50 packs/day for 65 years    Types: Cigarettes  . Smokeless tobacco: Never Used  . Alcohol Use: 0.0 oz/week    0 Standard drinks or equivalent per week     Comment: 11/03/2015 "not drinking anything now"  . Drug Use: No  . Sexual Activity: No   Other Topics Concern  . Not on file   Social History Narrative    Family History  Problem Relation Age of Onset  . CAD Father   . Diabetes Sister   . Diabetes Brother     ROS- All systems are reviewed and negative except as per the HPI above  Physical Exam: Filed Vitals:   11/14/15 1051  BP: 148/76  Pulse: 84  Height: 5\' 9"  (1.753 m)  Weight: 136 lb 6.4 oz (61.871 kg)    GEN- The patient is thin, chronically ill appearing, alert and oriented x 3 today.   Head- normocephalic, atraumatic Eyes-  Sclera clear, conjunctiva pink Ears- hearing intact Oropharynx- clear Neck- supple, no JVP Lymph- no cervical lymphadenopathy Lungs- Clear to ausculation bilaterally,  normal work of breathing Heart- Regular rate and rhythm, 2/6 sys  Murmurs, no rubs or gallops, PMI not laterally displaced GI- soft, NT, ND, + BS Extremities- no clubbing, cyanosis, or edema MS- no significant deformity or atrophy Skin- no rash or lesion Psych- euthymic mood, full affect Neuro- strength and sensation are intact  EKG-NSR 84 bpm, with PR int at 166 ms, qrs int 92 ms, qtc 399 ms Epic records improved  Assessment and  Plan: 1. PAF Now back in SR with increase of BB Will continue current dose Continue eliquis for chadsvasc of at least 4 Encouraged to decrease alcohol, smoking Sleep study for s/s snoring Echo pending with Dr. Stanford Breed   2. CAD, s/p bypass Encouraged to restart asa, he can discuss with Dr. Stanford Breed his concerns with asa GI did say ok for asa and eliquis, multiple gastric ulcers but no bleeding issues Continue BB, statin Lisinopril on hold due to bump of creatinine to 2.08 with dehydration from n/v with hospitalization  F/u as needed afib clinic Has f/u with Dr. Stanford Breed 5/17  Butch Penny C. Carroll, [facility] [address] [place], [place] [address] [phone]

## 2015-11-16 ENCOUNTER — Encounter: Payer: Self-pay | Admitting: Family Medicine

## 2015-11-16 ENCOUNTER — Telehealth: Payer: Self-pay | Admitting: Internal Medicine

## 2015-11-16 ENCOUNTER — Ambulatory Visit (INDEPENDENT_AMBULATORY_CARE_PROVIDER_SITE_OTHER): Payer: Medicare Other | Admitting: Family Medicine

## 2015-11-16 VITALS — BP 133/67 | HR 97 | Wt 136.0 lb

## 2015-11-16 DIAGNOSIS — K259 Gastric ulcer, unspecified as acute or chronic, without hemorrhage or perforation: Secondary | ICD-10-CM | POA: Diagnosis not present

## 2015-11-16 DIAGNOSIS — N419 Inflammatory disease of prostate, unspecified: Secondary | ICD-10-CM | POA: Insufficient documentation

## 2015-11-16 DIAGNOSIS — I2510 Atherosclerotic heart disease of native coronary artery without angina pectoris: Secondary | ICD-10-CM

## 2015-11-16 DIAGNOSIS — N410 Acute prostatitis: Secondary | ICD-10-CM

## 2015-11-16 DIAGNOSIS — I10 Essential (primary) hypertension: Secondary | ICD-10-CM

## 2015-11-16 DIAGNOSIS — K311 Adult hypertrophic pyloric stenosis: Secondary | ICD-10-CM | POA: Diagnosis not present

## 2015-11-16 DIAGNOSIS — N184 Chronic kidney disease, stage 4 (severe): Secondary | ICD-10-CM | POA: Diagnosis not present

## 2015-11-16 MED ORDER — SUCRALFATE 1 GM/10ML PO SUSP: 1.0000 g | Freq: Three times a day (TID) | ORAL | Status: DC

## 2015-11-16 MED ORDER — CIPROFLOXACIN HCL 500 MG PO TABS
500.0000 mg | ORAL_TABLET | Freq: Two times a day (BID) | ORAL | Status: DC
Start: 2015-11-16 — End: 2015-12-05

## 2015-11-16 NOTE — Assessment & Plan Note (Signed)
Continue Carafate and Protonix

## 2015-11-16 NOTE — Assessment & Plan Note (Signed)
We've called the gastroenterology office. The patient should expect a phone call soon for a follow-up appointment.

## 2015-11-16 NOTE — Assessment & Plan Note (Signed)
Creatinine upon review is nearing baseline around 1. Recheck creatinine in a few weeks.

## 2015-11-16 NOTE — Assessment & Plan Note (Signed)
Doing reasonably well. Continue to stop lisinopril. Recheck in about a month or sooner.

## 2015-11-16 NOTE — Telephone Encounter (Signed)
Pt scheduled to see Amy Esterwood PA 11/22/15@11am . Left message for pt to call back.

## 2015-11-16 NOTE — Progress Notes (Signed)
Bradley Stanley is a 82 y.o. male who presents to [facility] today for follow-up hospitalization. Patient was recently discharged from the hospital. He was admitted for abdominal pain and subsequently found to have gastric outlet obstruction due to acquired pyloric stenosis. Additionally he had gastritis and stomach ulcers. As result of his dehydration he developed acute kidney injury. Additionally his atrial fibrillation worsened immediately after hospitalization. Since he's been discharged she's doing reasonably well. He is able to eat and drink normally. He denies any subsequent vomiting. He notes the Carafate and twice daily Protonix is helpful. He has not yet heard from the gastroenterology office about a follow-up appointment. He denies any fevers or chills. Additionally he has stopped his lisinopril as a result of the acute kidney injury and his creatinine has been trending down per review of the medical record.  Of note patient was currently being treated for prostatitis before he became ill. He notes he felt much better after a few days of Cipro but since he's been in the hospital and off of Cipro his symptoms have returned. He would like to restart ciprofloxacin table.   Past Medical History  Diagnosis Date  . High cholesterol   . Hypertension   . Renal insufficiency   . BPH (benign prostatic hyperplasia)   . GERD (gastroesophageal reflux disease)   . CAD (coronary artery disease)   . Heart murmur   . Myocardial infarction ([place]) 04/2015  . Pneumonia 05/2015    "after heart surgery"  . Chronic bronchitis ([place])   . Anxiety     "occasionally" (11/03/2015)  . A-fib ([place]) 04/2015    after CABG.  Abixiban initiated.    Past Surgical History  Procedure Laterality Date  . Cataract extraction w/ intraocular lens  implant, bilateral    . Coronary artery bypass graft  04/2015    CABG  "X4; [place]"  . Inguinal hernia repair Bilateral   . Hand reconstruction Bilateral 1970s    "tore up by garage door spring"  . Esophagogastroduodenoscopy N/A 11/06/2015    Procedure: ESOPHAGOGASTRODUODENOSCOPY (EGD);  Surgeon: Jerene Bears, MD;  Location: [facility] ENDOSCOPY;  Service: Endoscopy;  Laterality: N/A;   Social History  Substance Use Topics  . Smoking status: Current Every Day Smoker -- 0.50 packs/day for 65 years    Types: Cigarettes  . Smokeless tobacco: Never Used  . Alcohol Use: 0.0 oz/week    0 Standard drinks or equivalent per week     Comment: 11/03/2015 "not drinking anything now"   family history includes CAD in his father; Diabetes in his brother and sister.  ROS as above Medications: Current Outpatient Prescriptions  Medication Sig Dispense Refill  . apixaban (ELIQUIS) 5 MG TABS tablet Take 5 mg by mouth 2 (two) times daily.    Marland Kitchen aspirin EC 81 MG tablet Take 1 tablet (81 mg total) by mouth daily.    Marland Kitchen atorvastatin (LIPITOR) 80 MG tablet Take 1 tablet (80 mg total) by mouth daily. 90 tablet 3  . Cetirizine HCl 10 MG CAPS Take 10 mg by mouth daily.    Marland Kitchen doxazosin (CARDURA) 4 MG tablet Take 1 tablet (4 mg total) by mouth daily. 90 tablet 2  . ferrous sulfate 325 (65 FE) MG EC tablet Take 325 mg by mouth daily with breakfast.     . furosemide (LASIX) 40 MG tablet Take 1 tablet (40 mg total) by mouth daily. 30 tablet 0  . LORazepam (  ATIVAN) 0.5 MG tablet Take 1 tablet (0.5 mg total) by mouth every 8 (eight) hours as needed for anxiety. 30 tablet 0  . metoprolol succinate (TOPROL-XL) 50 MG 24 hr tablet Take 1 tablet (50mg ) by mouth in the morning and 1/2 tablet (25mg )  in the evening. Take with or immediately following a meal. 45 tablet 6  . Multiple Vitamin (MULTIVITAMIN) tablet Take 1 tablet by mouth daily.    . pantoprazole (PROTONIX) 40 MG tablet Take 1 tablet (40 mg total) by mouth 2 (two) times daily. 60 tablet 0  . potassium chloride (KLOR-CON) 20 MEQ packet Take  by mouth 2 (two) times daily.    . sucralfate (CARAFATE) 1 GM/10ML suspension Take 10 mLs (1 g total) by mouth 4 (four) times daily -  with meals and at bedtime. 420 mL 1  . tamsulosin (FLOMAX) 0.4 MG CAPS capsule Take 1 capsule (0.4 mg total) by mouth daily. 90 capsule 3  . varenicline (CHANTIX STARTING MONTH PAK) 0.5 MG X 11 & 1 MG X 42 tablet Take one 0.5mg  tablet by mouth once daily for 3 days, then increase to one 0.5mg  tablet twice daily for 3 days, then increase to one 1mg  tablet twice daily. 53 tablet 0  . ciprofloxacin (CIPRO) 500 MG tablet Take 1 tablet (500 mg total) by mouth 2 (two) times daily. 28 tablet 0   No current facility-administered medications for this visit.   No Known Allergies   Exam:  BP 133/67 mmHg  Pulse 97  Wt 136 lb (61.689 kg) Gen: Well NAD Nontoxic appearing HEENT: EOMI,  MMM Lungs: Normal work of breathing. CTABL Heart: RRR no MRG Abd: NABS, Soft. Nondistended, Nontender Exts: Brisk capillary refill, warm and well perfused.   No results found for this or any previous visit (from the past 24 hour(s)). No results found.   Please see individual assessment and plan sections.

## 2015-11-16 NOTE — Assessment & Plan Note (Signed)
Restart Cipro. Recheck in a few weeks.

## 2015-11-16 NOTE — Patient Instructions (Signed)
Thank you for coming in today. Restart cipro.  Continue the carafate.  Continue the protonix.  Recheck in 2-4 weeks.  Let me know if your mood worsens.  If your belly pain worsens, or you have high fever, bad vomiting, blood in your stool or black tarry stool go to the Emergency Room.   You should hear from the stomach doctor's office.

## 2015-11-21 NOTE — Telephone Encounter (Signed)
Pt aware of appt.

## 2015-11-22 ENCOUNTER — Ambulatory Visit (INDEPENDENT_AMBULATORY_CARE_PROVIDER_SITE_OTHER): Payer: Medicare Other | Admitting: Physician Assistant

## 2015-11-22 ENCOUNTER — Other Ambulatory Visit (INDEPENDENT_AMBULATORY_CARE_PROVIDER_SITE_OTHER): Payer: Medicare Other

## 2015-11-22 ENCOUNTER — Encounter: Payer: Self-pay | Admitting: Physician Assistant

## 2015-11-22 VITALS — BP 124/70 | HR 68 | Ht 69.0 in | Wt 135.5 lb

## 2015-11-22 DIAGNOSIS — K253 Acute gastric ulcer without hemorrhage or perforation: Secondary | ICD-10-CM

## 2015-11-22 DIAGNOSIS — I2510 Atherosclerotic heart disease of native coronary artery without angina pectoris: Secondary | ICD-10-CM

## 2015-11-22 DIAGNOSIS — D62 Acute posthemorrhagic anemia: Secondary | ICD-10-CM

## 2015-11-22 LAB — CBC WITH DIFFERENTIAL/PLATELET
BASOS PCT: 0.4 % (ref 0.0–3.0)
Basophils Absolute: 0 10*3/uL (ref 0.0–0.1)
EOS PCT: 1.5 % (ref 0.0–5.0)
Eosinophils Absolute: 0.1 10*3/uL (ref 0.0–0.7)
HCT: 34.4 % — ABNORMAL LOW (ref 39.0–52.0)
HEMOGLOBIN: 11.4 g/dL — AB (ref 13.0–17.0)
Lymphocytes Relative: 25.8 % (ref 12.0–46.0)
Lymphs Abs: 2.4 10*3/uL (ref 0.7–4.0)
MCHC: 33.2 g/dL (ref 30.0–36.0)
MCV: 90.4 fl (ref 78.0–100.0)
MONO ABS: 0.9 10*3/uL (ref 0.1–1.0)
Monocytes Relative: 9.4 % (ref 3.0–12.0)
Neutro Abs: 5.7 10*3/uL (ref 1.4–7.7)
Neutrophils Relative %: 62.9 % (ref 43.0–77.0)
Platelets: 449 10*3/uL — ABNORMAL HIGH (ref 150.0–400.0)
RBC: 3.81 Mil/uL — ABNORMAL LOW (ref 4.22–5.81)
RDW: 16.8 % — AB (ref 11.5–15.5)
WBC: 9.1 10*3/uL (ref 4.0–10.5)

## 2015-11-22 LAB — IBC PANEL
IRON: 38 ug/dL — AB (ref 42–165)
Saturation Ratios: 10.4 % — ABNORMAL LOW (ref 20.0–50.0)
Transferrin: 260 mg/dL (ref 212.0–360.0)

## 2015-11-22 LAB — FERRITIN: Ferritin: 29.1 ng/mL (ref 22.0–322.0)

## 2015-11-22 MED ORDER — PANTOPRAZOLE SODIUM 40 MG PO TBEC: 40.0000 mg | DELAYED_RELEASE_TABLET | Freq: Two times a day (BID) | ORAL | Status: DC

## 2015-11-22 NOTE — Patient Instructions (Signed)
Please go to the basement level to have your labs drawn.  We sent a prescription to [facility] for Pantoprazole sodium 40 mg.   We have you on a wait list for the first available office appointment with Dr. Zenovia Jarred.

## 2015-11-22 NOTE — Progress Notes (Addendum)
Patient ID: Bradley Stanley, male   DOB: 1933/06/15, 82 y.o.   MRN: [id number]   Subjective:    Patient ID: Bradley Stanley, male    DOB: October 29, 1932, 82 y.o.   MRN: [id number]  HPI  Bradley Stanley  Is a very nice 82 year old white male who comes in today for post hospital follow-up. He was admitted 11/05/2015 with a one-week history of nausea and vomiting with inability to keep down any by mouth's. CT on admission showed gastric distention. He was seen in consultation by Dr. Hilarie Stanley and underwent upper endoscopy at which time he had 3 L of fluid /retained gastric contents removed from his stomach found to have moderate to severe pyloric stenosis , and partially obstructing nonbleeding gastric ulcers at the pylorus. H. pylori testing was negative. He was treated with high-dose PPI was also discharged home on Carafate suspension. Patient was mildly anemic with hemoglobin of 10.6 on 11/08/2015. He is status post MI and CABG done in October 2016, had a difficult time postoperatively with weight loss etc. He is being maintained on baby aspirin and Eliquis. He had been on OTC Prilosec prior to admission. Patient as he has been doing well since discharge and is trying hard to regain some weight. He says he doesn't have much appetite and hasn't been a big eater for many years. He is drinking N sure about 3 times daily and is also taking a protein/ice cream shake at nighttime. He denies any abdominal pain has not had any nausea or vomiting. His bowel movements have been normal without melena or hematochezia. He asks how long he needs to stay on Carafate and his wife is asking about need for ongoing iron supplementation. Patient has long-standing history of GERD and had also undergone prior colonoscopy in [place] between 10-15 years ago and was told this was negative.  Review of Systems Pertinent positive and negative review of systems were noted in the above HPI section.  All other review of systems was otherwise  negative.  Outpatient Encounter Prescriptions as of 11/22/2015  Medication Sig  . apixaban (ELIQUIS) 5 MG TABS tablet Take 5 mg by mouth 2 (two) times daily.  Marland Kitchen aspirin EC 81 MG tablet Take 1 tablet (81 mg total) by mouth daily.  Marland Kitchen atorvastatin (LIPITOR) 80 MG tablet Take 1 tablet (80 mg total) by mouth daily.  . Cetirizine HCl 10 MG CAPS Take 10 mg by mouth daily.  . ciprofloxacin (CIPRO) 500 MG tablet Take 1 tablet (500 mg total) by mouth 2 (two) times daily.  Marland Kitchen doxazosin (CARDURA) 4 MG tablet Take 1 tablet (4 mg total) by mouth daily.  . ferrous sulfate 325 (65 FE) MG EC tablet Take 325 mg by mouth daily with breakfast.   . furosemide (LASIX) 40 MG tablet Take 1 tablet (40 mg total) by mouth daily.  Marland Kitchen LORazepam (ATIVAN) 0.5 MG tablet Take 1 tablet (0.5 mg total) by mouth every 8 (eight) hours as needed for anxiety.  . metoprolol succinate (TOPROL-XL) 50 MG 24 hr tablet Take 1 tablet (50mg ) by mouth in the morning and 1/2 tablet (25mg )  in the evening. Take with or immediately following a meal.  . Multiple Vitamin (MULTIVITAMIN) tablet Take 1 tablet by mouth daily.  . pantoprazole (PROTONIX) 40 MG tablet Take 1 tablet (40 mg total) by mouth 2 (two) times daily.  . potassium chloride (KLOR-CON) 20 MEQ packet Take by mouth 2 (two) times daily.  . sucralfate (CARAFATE) 1 GM/10ML suspension Take 10 mLs (  1 g total) by mouth 4 (four) times daily -  with meals and at bedtime.  . tamsulosin (FLOMAX) 0.4 MG CAPS capsule Take 1 capsule (0.4 mg total) by mouth daily.  . varenicline (CHANTIX STARTING MONTH PAK) 0.5 MG X 11 & 1 MG X 42 tablet Take one 0.5mg  tablet by mouth once daily for 3 days, then increase to one 0.5mg  tablet twice daily for 3 days, then increase to one 1mg  tablet twice daily.  . [DISCONTINUED] pantoprazole (PROTONIX) 40 MG tablet Take 1 tablet (40 mg total) by mouth 2 (two) times daily.   No facility-administered encounter medications on file as of 11/22/2015.   No Known  Allergies Patient Active Problem List   Diagnosis Date Noted  . Prostatitis 11/16/2015  . Multiple gastric ulcers   . Pyloric stenosis, acquired   . CKD (chronic kidney disease) stage 4, GFR 15-29 ml/min (HCC) 11/04/2015  . Paroxysmal atrial fibrillation ([place]) 11/04/2015  . Dyslipidemia 11/04/2015  . Protein-calorie malnutrition, severe 11/04/2015  . Gastric outlet obstruction 11/04/2015  . Essential hypertension 10/19/2015  . Peripheral vascular disease ([place]) 10/19/2015   Social History   Social History  . Marital Status: Married    Spouse Name: N/A  . Number of Children: 3  . Years of Education: N/A   Occupational History  . Not on file.   Social History Main Topics  . Smoking status: Current Every Day Smoker -- 0.50 packs/day for 65 years    Types: Cigarettes  . Smokeless tobacco: Never Used  . Alcohol Use: 0.0 oz/week    0 Standard drinks or equivalent per week     Comment: 11/03/2015 "not drinking anything now"  . Drug Use: No  . Sexual Activity: No   Other Topics Concern  . Not on file   Social History Narrative    [name]'s family history includes CAD in his father; Diabetes in his brother and sister.      Objective:    Filed Vitals:   11/22/15 1054  BP: 124/70  Pulse: 68    Physical Exam  well-developed older white male in no acute distress, very pleasant accompanied by his wife blood pressure 08/15/1966 pulse 68 height 5 foot 9 weight 135. HEENT; nontraumatic, cephalic EOMI PERRLA sclera anicteric, Cardiovascular; regular rate and rhythm with S1-S2 no murmur or gallop, Pulmonary clear bilaterally, Abdomen ;soft nontender nondistended bowel sounds are present no palpable mass or hepatosplenomegaly, Rectal; exam not done, Ext; no clubbing cyanosis or edema skin warm and dry, Neuropsych ;mood and affect appropriate     Assessment & Plan:   #1 82 yo male with Recent hospitalization with partial gastric outlet obstruction secondary to non-bleeding  gastric ulcers at the pylorus with moderate to severe pyloric stenosis secondary to inflammation. Patient doing well and gaining weight. No current complaints of abdominal pain nausea or vomiting. #2Anemia normocytic #3 acute kidney injury pre-renal on admit, superimposed on chronic kidney disease #4Chronic anticoagulation with eliquis and aspirin #5Coronary artery disease status post MI and CABG October 2016  Plan; check follow-up CBC today check iron studies Continue Protonix 40 mg by mouth twice a day for 8-12 weeks, he will need to stay on higher dose PPI long-term given development of outlet obstruction while on OTC Prilosec He will finish current prescription of Carafate and discontinue Continue small frequent feedings and supplements Follow-up with Dr.Pyrtle in 6-8 weeks    Amy S Esterwood PA-C 11/22/2015   Cc: Gregor Hams, MD   Addendum: Reviewed and  agree with ongoing management. Jerene Bears, MD

## 2015-11-24 ENCOUNTER — Ambulatory Visit: Payer: Medicare Other | Admitting: Family Medicine

## 2015-11-29 ENCOUNTER — Other Ambulatory Visit: Payer: Self-pay | Admitting: Family Medicine

## 2015-11-29 DIAGNOSIS — Z136 Encounter for screening for cardiovascular disorders: Secondary | ICD-10-CM

## 2015-11-29 DIAGNOSIS — I714 Abdominal aortic aneurysm, without rupture, unspecified: Secondary | ICD-10-CM

## 2015-11-29 NOTE — Progress Notes (Unsigned)
Order changed per [facility] request.

## 2015-11-30 ENCOUNTER — Ambulatory Visit (HOSPITAL_BASED_OUTPATIENT_CLINIC_OR_DEPARTMENT_OTHER)
Admission: RE | Admit: 2015-11-30 | Discharge: 2015-11-30 | Disposition: A | Discharge status: Home / Self Care | Payer: Medicare Other | Source: Ambulatory Visit | Attending: Sports Medicine | Admitting: Sports Medicine

## 2015-11-30 ENCOUNTER — Ambulatory Visit (HOSPITAL_BASED_OUTPATIENT_CLINIC_OR_DEPARTMENT_OTHER)
Admission: RE | Admit: 2015-11-30 | Discharge: 2015-11-30 | Disposition: A | Discharge status: Home / Self Care | Payer: Medicare Other | Source: Ambulatory Visit | Attending: Cardiology | Admitting: Cardiology

## 2015-11-30 ENCOUNTER — Ambulatory Visit: Payer: Medicare Other | Admitting: Cardiology

## 2015-11-30 ENCOUNTER — Ambulatory Visit (HOSPITAL_BASED_OUTPATIENT_CLINIC_OR_DEPARTMENT_OTHER): Payer: Medicare Other

## 2015-11-30 DIAGNOSIS — I714 Abdominal aortic aneurysm, without rupture, unspecified: Secondary | ICD-10-CM

## 2015-11-30 DIAGNOSIS — Z136 Encounter for screening for cardiovascular disorders: Secondary | ICD-10-CM | POA: Insufficient documentation

## 2015-11-30 DIAGNOSIS — Z951 Presence of aortocoronary bypass graft: Secondary | ICD-10-CM | POA: Insufficient documentation

## 2015-11-30 DIAGNOSIS — I358 Other nonrheumatic aortic valve disorders: Secondary | ICD-10-CM | POA: Diagnosis not present

## 2015-11-30 DIAGNOSIS — I10 Essential (primary) hypertension: Secondary | ICD-10-CM | POA: Diagnosis not present

## 2015-11-30 DIAGNOSIS — R011 Cardiac murmur, unspecified: Secondary | ICD-10-CM | POA: Diagnosis not present

## 2015-11-30 DIAGNOSIS — I119 Hypertensive heart disease without heart failure: Secondary | ICD-10-CM | POA: Diagnosis not present

## 2015-11-30 DIAGNOSIS — R29898 Other symptoms and signs involving the musculoskeletal system: Secondary | ICD-10-CM | POA: Insufficient documentation

## 2015-11-30 NOTE — Progress Notes (Signed)
Echocardiogram 2D Echocardiogram has been performed.  Tresa Res 11/30/2015, 11:00 AM

## 2015-12-05 ENCOUNTER — Ambulatory Visit (INDEPENDENT_AMBULATORY_CARE_PROVIDER_SITE_OTHER): Payer: Medicare Other | Admitting: Family Medicine

## 2015-12-05 ENCOUNTER — Encounter: Payer: Self-pay | Admitting: Family Medicine

## 2015-12-05 VITALS — BP 131/67 | HR 96 | Wt 143.0 lb

## 2015-12-05 DIAGNOSIS — K259 Gastric ulcer, unspecified as acute or chronic, without hemorrhage or perforation: Secondary | ICD-10-CM

## 2015-12-05 DIAGNOSIS — N184 Chronic kidney disease, stage 4 (severe): Secondary | ICD-10-CM | POA: Diagnosis not present

## 2015-12-05 DIAGNOSIS — R062 Wheezing: Secondary | ICD-10-CM

## 2015-12-05 DIAGNOSIS — I5022 Chronic systolic (congestive) heart failure: Secondary | ICD-10-CM | POA: Diagnosis not present

## 2015-12-05 DIAGNOSIS — I2510 Atherosclerotic heart disease of native coronary artery without angina pectoris: Secondary | ICD-10-CM | POA: Diagnosis not present

## 2015-12-05 DIAGNOSIS — I480 Paroxysmal atrial fibrillation: Secondary | ICD-10-CM | POA: Diagnosis not present

## 2015-12-05 MED ORDER — ALBUTEROL SULFATE HFA 108 (90 BASE) MCG/ACT IN AERS: 2.0000 | INHALATION_SPRAY | Freq: Four times a day (QID) | RESPIRATORY_TRACT | Status: DC | PRN

## 2015-12-05 NOTE — Assessment & Plan Note (Signed)
Doing well. Comanagement with cardiology.

## 2015-12-05 NOTE — Assessment & Plan Note (Signed)
Doing clinically well. I believe he may benefit from cardiac rehabilitation. Appreciate cardiology recommendations on May 17. Continue current regimen.

## 2015-12-05 NOTE — Assessment & Plan Note (Signed)
Doing well. Continue PPI. Comanagement with GI.

## 2015-12-05 NOTE — Progress Notes (Signed)
Bradley Stanley is a 82 y.o. male who presents to [facility] today for follow-up recent hospitalization. Patient was recently hospitalized with nausea and vomiting and ultimately due to pyloric stenosis and gastritis. This resulted in acute on chronic kidney injury. He's feeling much better now. He's had follow-up with gastroenterology who elects for watchful waiting with PPI. Additionally he's had an echocardiogram that shows ejection fraction of 35-40%. He is an appointment with cardiology in 2 days. He feels well overall. He notes his strength is not back to where it should be. His goal is to be able to play a round of golf without becoming exhausted. He typically rides in a golf cart and is unable to play golf currently. Additionally he like to be able to mow the lawn with a push mower without becoming exhausted. He had cardiac rehabilitation following his CABG surgery several years ago and would like to repeat cardiac rehabilitation if possible. Additionally he had prostatitis and took Cipro for prostatitis and feels much better.   Additionally patient does note some wheezing. He notes he's never been diagnosed with COPD but is a lifelong smoker. He in the past used an albuterol inhaler would like to restart if possible. He notes his lungs are slightly worse today than they typically are.   Past Medical History  Diagnosis Date  . High cholesterol   . Hypertension   . Renal insufficiency   . BPH (benign prostatic hyperplasia)   . GERD (gastroesophageal reflux disease)   . CAD (coronary artery disease)   . Heart murmur   . Myocardial infarction ([place]) 04/2015  . Pneumonia 05/2015    "after heart surgery"  . Chronic bronchitis ([place])   . Anxiety     "occasionally" (11/03/2015)  . A-fib ([place]) 04/2015    after CABG.  Abixiban initiated.    Past Surgical History  Procedure Laterality Date    . Cataract extraction w/ intraocular lens  implant, bilateral    . Coronary artery bypass graft  04/2015    CABG "X4; [place]"  . Inguinal hernia repair Bilateral   . Hand reconstruction Bilateral 1970s    "tore up by garage door spring"  . Esophagogastroduodenoscopy N/A 11/06/2015    Procedure: ESOPHAGOGASTRODUODENOSCOPY (EGD);  Surgeon: Jerene Bears, MD;  [facility] ENDOSCOPY;  Service: Endoscopy;  Laterality: N/A;   Social History  Substance Use Topics  . Smoking status: Current Every Day Smoker -- 0.50 packs/day for 65 years    Types: Cigarettes  . Smokeless tobacco: Never Used  . Alcohol Use: 0.0 oz/week    0 Standard drinks or equivalent per week     Comment: 11/03/2015 "not drinking anything now"   family history includes CAD in his father; Diabetes in his brother and sister.  ROS as above Medications: Current Outpatient Prescriptions  Medication Sig Dispense Refill  . albuterol (PROVENTIL HFA;VENTOLIN HFA) 108 (90 Base) MCG/ACT inhaler Inhale 2 puffs into the lungs every 6 (six) hours as needed for wheezing or shortness of breath. 1 Inhaler 0  . apixaban (ELIQUIS) 5 MG TABS tablet Take 5 mg by mouth 2 (two) times daily.    Marland Kitchen aspirin EC 81 MG tablet Take 1 tablet (81 mg total) by mouth daily.    Marland Kitchen atorvastatin (LIPITOR) 80 MG tablet Take 1 tablet (80 mg total) by mouth daily. 90 tablet 3  . Cetirizine HCl 10 MG CAPS Take 10 mg by mouth daily.    Marland Kitchen  doxazosin (CARDURA) 4 MG tablet Take 1 tablet (4 mg total) by mouth daily. 90 tablet 2  . ferrous sulfate 325 (65 FE) MG EC tablet Take 325 mg by mouth daily with breakfast.     . furosemide (LASIX) 40 MG tablet Take 1 tablet (40 mg total) by mouth daily. 30 tablet 0  . LORazepam (ATIVAN) 0.5 MG tablet Take 1 tablet (0.5 mg total) by mouth every 8 (eight) hours as needed for anxiety. 30 tablet 0  . metoprolol succinate (TOPROL-XL) 50 MG 24 hr tablet Take 1 tablet (50mg ) by mouth in the morning and 1/2 tablet (25mg )  in the  evening. Take with or immediately following a meal. 45 tablet 6  . Multiple Vitamin (MULTIVITAMIN) tablet Take 1 tablet by mouth daily.    . pantoprazole (PROTONIX) 40 MG tablet Take 1 tablet (40 mg total) by mouth 2 (two) times daily. 60 tablet 6  . potassium chloride (KLOR-CON) 20 MEQ packet Take by mouth 2 (two) times daily.    . sucralfate (CARAFATE) 1 GM/10ML suspension Take 10 mLs (1 g total) by mouth 4 (four) times daily -  with meals and at bedtime. 420 mL 1  . tamsulosin (FLOMAX) 0.4 MG CAPS capsule Take 1 capsule (0.4 mg total) by mouth daily. 90 capsule 3  . varenicline (CHANTIX STARTING MONTH PAK) 0.5 MG X 11 & 1 MG X 42 tablet Take one 0.5mg  tablet by mouth once daily for 3 days, then increase to one 0.5mg  tablet twice daily for 3 days, then increase to one 1mg  tablet twice daily. 53 tablet 0   No current facility-administered medications for this visit.   No Known Allergies   Exam:  BP 131/67 mmHg  Pulse 96  Wt 143 lb (64.864 kg) Gen: Well NAD HEENT: EOMI,  MMM Lungs: Normal work of breathing.  Slight wheezing with prolonged expiratory phase bilaterally.  Heart: RRR no MRG Abd: NABS, Soft. Nondistended, Nontender Exts: Brisk capillary refill, warm and well perfused.   No results found for this or any previous visit (from the past 24 hour(s)). No results found.   Please see individual assessment and plan sections.

## 2015-12-05 NOTE — Assessment & Plan Note (Signed)
Likely due to COPD. Start albuterol. Avoid prednisone if possible due to recent gastric ulcers. Recheck in the near future. Hopefully will be able to obtain spirometry when feeling better.

## 2015-12-05 NOTE — Patient Instructions (Signed)
Thank you for coming in today. Return in 1 month.  Follow up with the heart doctor. Ask about Cardiac Rehab.  Use albuterol as needed.  Call or go to the emergency room if you get worse, have trouble breathing, have chest pains, or palpitations.

## 2015-12-05 NOTE — Assessment & Plan Note (Signed)
Check metabolic panel today.

## 2015-12-07 ENCOUNTER — Encounter: Payer: Self-pay | Admitting: Cardiology

## 2015-12-07 ENCOUNTER — Ambulatory Visit (INDEPENDENT_AMBULATORY_CARE_PROVIDER_SITE_OTHER): Payer: Medicare Other | Admitting: Cardiology

## 2015-12-07 VITALS — BP 117/66 | HR 89 | Ht 69.0 in | Wt 141.1 lb

## 2015-12-07 DIAGNOSIS — I5022 Chronic systolic (congestive) heart failure: Secondary | ICD-10-CM

## 2015-12-07 DIAGNOSIS — I10 Essential (primary) hypertension: Secondary | ICD-10-CM | POA: Diagnosis not present

## 2015-12-07 DIAGNOSIS — E785 Hyperlipidemia, unspecified: Secondary | ICD-10-CM

## 2015-12-07 DIAGNOSIS — I739 Peripheral vascular disease, unspecified: Secondary | ICD-10-CM | POA: Diagnosis not present

## 2015-12-07 DIAGNOSIS — I480 Paroxysmal atrial fibrillation: Secondary | ICD-10-CM | POA: Diagnosis not present

## 2015-12-07 DIAGNOSIS — I255 Ischemic cardiomyopathy: Secondary | ICD-10-CM | POA: Diagnosis not present

## 2015-12-07 DIAGNOSIS — I25810 Atherosclerosis of coronary artery bypass graft(s) without angina pectoris: Secondary | ICD-10-CM

## 2015-12-07 DIAGNOSIS — Z720 Tobacco use: Secondary | ICD-10-CM

## 2015-12-07 MED ORDER — LISINOPRIL 2.5 MG PO TABS: 2.5000 mg | ORAL_TABLET | Freq: Every day | ORAL | Status: DC

## 2015-12-07 NOTE — Assessment & Plan Note (Signed)
Continue statin. 

## 2015-12-07 NOTE — Patient Instructions (Signed)
Medication Instructions:   START LISINOPRIL 2.5 MG ONCE DAILY  Labwork:  Your physician recommends that you return for lab work in: [place]:  Your physician recommends that you schedule a follow-up appointment in: [place]

## 2015-12-07 NOTE — Assessment & Plan Note (Signed)
ContinueBeta blocker. Resume lisinopril 2.5 mg daily. Check potassium and renal function in 1 week. He does have baseline renal insufficiency.

## 2015-12-07 NOTE — Assessment & Plan Note (Signed)
Continue present dose of Lasix. Check potassium and renal function. 

## 2015-12-07 NOTE — Assessment & Plan Note (Signed)
Patient remains in sinus rhythm on examination. He was seen recently in the atrial fibrillation clinic for elevated heart rate but there was no documentation of recurrent atrial fibrillation. I think his atrial fibrillation was related to his bypass surgery. I will see him back in 3 months and if he maintains sinus rhythm we will discontinue anticoagulation. Continue beta blocker.

## 2015-12-07 NOTE — Assessment & Plan Note (Signed)
Patient counseled on discontinuing. 

## 2015-12-07 NOTE — Progress Notes (Signed)
HPI: FU coronary artery disease. Patient had an anteroseptal myocardial infarction in October [id number] complicated by cardiogenic shock. Cared for in [place]. He underwent emergent coronary artery bypass graft for left main disease and an occluded LAD. He had grafting of his LAD and ramus intermedius. His right coronary artery was occluded with left collaterals and was not grafted. There was a circumflex that rose anomalously from the right coronary cusp and had what was described as modest disease and was not grafted. He did have postoperative atrial fibrillation. Echocardiogram May 2017 showed ejection fraction 35-40% with akinesis of the anterior, septal, apical and inferior apical walls. 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 Follow-up recommended in 5 years. Patient was admitted in April 2017 with nausea and vomiting. He was found to have gastric ulcers and esophagitis. There was pyloric stenosis. Patient was seen in the atrial fibrillation clinic for heart rate of 90.There is no documentation that he had recurrent atrial fibrillation. Since last seen, He has some dyspnea on exertion but no orthopnea, PND, pedal edema, chest pain, palpitations or syncope.  Current Outpatient Prescriptions  Medication Sig Dispense Refill  . albuterol (PROVENTIL HFA;VENTOLIN HFA) 108 (90 Base) MCG/ACT inhaler Inhale 2 puffs into the lungs every 6 (six) hours as needed for wheezing or shortness of breath. 1 Inhaler 0  . apixaban (ELIQUIS) 5 MG TABS tablet Take 5 mg by mouth 2 (two) times daily.    Marland Kitchen aspirin EC 81 MG tablet Take 1 tablet (81 mg total) by mouth daily.    Marland Kitchen atorvastatin (LIPITOR) 80 MG tablet Take 1 tablet (80 mg total) by mouth daily. 90 tablet 3  . Cetirizine HCl 10 MG CAPS Take 10 mg by mouth daily.    Marland Kitchen doxazosin (CARDURA) 4 MG tablet Take 1 tablet (4 mg total) by mouth daily. 90 tablet 2  . ferrous sulfate 325 (65 FE) MG EC tablet Take 325 mg by mouth daily with  breakfast.     . furosemide (LASIX) 40 MG tablet Take 1 tablet (40 mg total) by mouth daily. 30 tablet 0  . LORazepam (ATIVAN) 0.5 MG tablet Take 1 tablet (0.5 mg total) by mouth every 8 (eight) hours as needed for anxiety. 30 tablet 0  . metoprolol succinate (TOPROL-XL) 50 MG 24 hr tablet Take 1 tablet (50mg ) by mouth in the morning and 1/2 tablet (25mg )  in the evening. Take with or immediately following a meal. 45 tablet 6  . Multiple Vitamin (MULTIVITAMIN) tablet Take 1 tablet by mouth daily.    . pantoprazole (PROTONIX) 40 MG tablet Take 1 tablet (40 mg total) by mouth 2 (two) times daily. 60 tablet 6  . potassium chloride (KLOR-CON) 20 MEQ packet Take by mouth 2 (two) times daily.    . sucralfate (CARAFATE) 1 GM/10ML suspension Take 10 mLs (1 g total) by mouth 4 (four) times daily -  with meals and at bedtime. 420 mL 1  . tamsulosin (FLOMAX) 0.4 MG CAPS capsule Take 1 capsule (0.4 mg total) by mouth daily. 90 capsule 3  . varenicline (CHANTIX STARTING MONTH PAK) 0.5 MG X 11 & 1 MG X 42 tablet Take one 0.5mg  tablet by mouth once daily for 3 days, then increase to one 0.5mg  tablet twice daily for 3 days, then increase to one 1mg  tablet twice daily. 53 tablet 0   No current facility-administered medications for this visit.     Past Medical History  Diagnosis Date  . High  cholesterol   . Hypertension   . Renal insufficiency   . BPH (benign prostatic hyperplasia)   . GERD (gastroesophageal reflux disease)   . CAD (coronary artery disease)   . Heart murmur   . Myocardial infarction ([place]) 04/2015  . Pneumonia 05/2015    "after heart surgery"  . Chronic bronchitis ([place])   . Anxiety     "occasionally" (11/03/2015)  . A-fib ([place]) 04/2015    after CABG.  Abixiban initiated.     Past Surgical History  Procedure Laterality Date  . Cataract extraction w/ intraocular lens  implant, bilateral    . Coronary artery bypass graft  04/2015    CABG "X4; [place]"  . Inguinal hernia repair  Bilateral   . Hand reconstruction Bilateral 1970s    "tore up by garage door spring"  . Esophagogastroduodenoscopy N/A 11/06/2015    Procedure: ESOPHAGOGASTRODUODENOSCOPY (EGD);  Surgeon: Jerene Bears, MD;  Location: [facility] ENDOSCOPY;  Service: Endoscopy;  Laterality: N/A;    Social History   Social History  . Marital Status: Married    Spouse Name: N/A  . Number of Children: 3  . Years of Education: N/A   Occupational History  . Not on file.   Social History Main Topics  . Smoking status: Current Every Day Smoker -- 0.50 packs/day for 65 years    Types: Cigarettes  . Smokeless tobacco: Never Used  . Alcohol Use: 0.0 oz/week    0 Standard drinks or equivalent per week     Comment: 11/03/2015 "not drinking anything now"  . Drug Use: No  . Sexual Activity: No   Other Topics Concern  . Not on file   Social History Narrative    Family History  Problem Relation Age of Onset  . CAD Father   . Diabetes Sister   . Diabetes Brother     ROS: no fevers or chills, productive cough, hemoptysis, dysphasia, odynophagia, melena, hematochezia, dysuria, hematuria, rash, seizure activity, orthopnea, PND, pedal edema, claudication. Remaining systems are negative.  Physical Exam: Well-developed well-nourished in no acute distress.  Skin is warm and dry.  HEENT is normal.  Neck is supple.  Chest Diminished breath sounds Cardiovascular exam is regular rate and rhythm. 2/6 systolic murmur left sternal border. Abdominal exam nontender or distended. No masses palpated. Extremities show no edema. neuro grossly intact

## 2015-12-07 NOTE — Assessment & Plan Note (Signed)
Blood pressure controlled. Continue present medications. 

## 2015-12-07 NOTE — Assessment & Plan Note (Signed)
Continue Aspirin and statin.Follow-up abdominal ultrasound May 2022.

## 2015-12-20 ENCOUNTER — Telehealth (HOSPITAL_COMMUNITY): Payer: Self-pay | Admitting: *Deleted

## 2015-12-20 DIAGNOSIS — I25810 Atherosclerosis of coronary artery bypass graft(s) without angina pectoris: Secondary | ICD-10-CM | POA: Diagnosis not present

## 2015-12-21 LAB — BASIC METABOLIC PANEL
BUN: 18 mg/dL (ref 7–25)
CALCIUM: 8.8 mg/dL (ref 8.6–10.3)
CO2: 26 mmol/L (ref 20–31)
Chloride: 103 mmol/L (ref 98–110)
Creat: 1.3 mg/dL — ABNORMAL HIGH (ref 0.70–1.11)
Glucose, Bld: 77 mg/dL (ref 65–99)
POTASSIUM: 4.3 mmol/L (ref 3.5–5.3)
SODIUM: 139 mmol/L (ref 135–146)

## 2016-01-02 ENCOUNTER — Other Ambulatory Visit: Payer: Self-pay | Admitting: Family Medicine

## 2016-01-03 NOTE — Telephone Encounter (Signed)
Do you want pt to continue the medications being requested? Please advise.

## 2016-01-04 ENCOUNTER — Encounter: Payer: Self-pay | Admitting: Family Medicine

## 2016-01-04 ENCOUNTER — Other Ambulatory Visit: Payer: Self-pay | Admitting: Internal Medicine

## 2016-01-05 MED ORDER — POTASSIUM CHLORIDE CRYS ER 20 MEQ PO TBCR: 20.0000 meq | EXTENDED_RELEASE_TABLET | Freq: Two times a day (BID) | ORAL | Status: DC

## 2016-01-05 MED ORDER — FUROSEMIDE 40 MG PO TABS: 40.0000 mg | ORAL_TABLET | Freq: Every day | ORAL | Status: DC

## 2016-01-05 MED ORDER — APIXABAN 5 MG PO TABS: 5.0000 mg | ORAL_TABLET | Freq: Two times a day (BID) | ORAL | Status: DC

## 2016-01-05 NOTE — Addendum Note (Signed)
Addended by: Gregor Hams on: 01/05/2016 10:09 AM   Modules accepted: Orders

## 2016-01-10 ENCOUNTER — Encounter (HOSPITAL_BASED_OUTPATIENT_CLINIC_OR_DEPARTMENT_OTHER): Payer: Medicare Other

## 2016-01-12 ENCOUNTER — Encounter: Payer: Self-pay | Admitting: *Deleted

## 2016-01-25 ENCOUNTER — Ambulatory Visit: Payer: Medicare Other | Admitting: Internal Medicine

## 2016-01-25 ENCOUNTER — Telehealth: Payer: Self-pay | Admitting: *Deleted

## 2016-01-25 NOTE — Telephone Encounter (Signed)
No show letter mailed to patient. 

## 2016-01-27 ENCOUNTER — Other Ambulatory Visit: Payer: Self-pay | Admitting: Family Medicine

## 2016-01-31 ENCOUNTER — Ambulatory Visit (INDEPENDENT_AMBULATORY_CARE_PROVIDER_SITE_OTHER): Payer: Medicare Other | Admitting: Family Medicine

## 2016-01-31 ENCOUNTER — Encounter: Payer: Self-pay | Admitting: Family Medicine

## 2016-01-31 VITALS — BP 144/72 | HR 112 | Wt 150.0 lb

## 2016-01-31 DIAGNOSIS — M25562 Pain in left knee: Secondary | ICD-10-CM

## 2016-01-31 DIAGNOSIS — I255 Ischemic cardiomyopathy: Secondary | ICD-10-CM | POA: Diagnosis not present

## 2016-01-31 NOTE — Progress Notes (Signed)
Bradley Stanley is a 82 y.o. male who presents to [facility] today for left knee pain. Patient notes left knee pain for the last 2 days. He notes lateral pain in the knee itself and some pain radiating to his lateral calf. He denies any injury. He notes he had an injection in his left knee in April 2017 that worked until about 2 days ago. He would like a repeat injection today. He denies any new weakness or numbness or loss of function.  Her health concerns: Doing quite well. He has recovered from his recent hospitalization a few months ago. He feels as though he is gaining energy strength and weight. He continues taking medications listed below.   Past Medical History  Diagnosis Date  . High cholesterol   . Hypertension   . Renal insufficiency   . BPH (benign prostatic hyperplasia)   . GERD (gastroesophageal reflux disease)   . CAD (coronary artery disease)   . Heart murmur   . Myocardial infarction ([place]) 04/2015  . Pneumonia 05/2015    "after heart surgery"  . Chronic bronchitis ([place])   . Anxiety     "occasionally" (11/03/2015)  . A-fib ([place]) 04/2015    after CABG.  Abixiban initiated.   . Pyloric ulcer   . Acute blood loss anemia    Past Surgical History  Procedure Laterality Date  . Cataract extraction w/ intraocular lens  implant, bilateral    . Coronary artery bypass graft  04/2015    CABG "X4; [place]"  . Inguinal hernia repair Bilateral   . Hand reconstruction Bilateral 1970s    "tore up by garage door spring"  . Esophagogastroduodenoscopy N/A 11/06/2015    Procedure: ESOPHAGOGASTRODUODENOSCOPY (EGD);  Surgeon: Jerene Bears, MD;  Location: [facility] ENDOSCOPY;  Service: Endoscopy;  Laterality: N/A;   Social History  Substance Use Topics  . Smoking status: Current Every Day Smoker -- 0.50 packs/day for 65 years    Types: Cigarettes  . Smokeless tobacco:  Never Used  . Alcohol Use: 0.0 oz/week    0 Standard drinks or equivalent per week     Comment: 11/03/2015 "not drinking anything now"   family history includes CAD in his father; Diabetes in his brother and sister.  ROS as above:  Medications: Current Outpatient Prescriptions  Medication Sig Dispense Refill  . apixaban (ELIQUIS) 5 MG TABS tablet Take 1 tablet (5 mg total) by mouth 2 (two) times daily. 60 tablet 6  . aspirin EC 81 MG tablet Take 1 tablet (81 mg total) by mouth daily.    Marland Kitchen atorvastatin (LIPITOR) 80 MG tablet Take 1 tablet (80 mg total) by mouth daily. 90 tablet 3  . CARAFATE 1 GM/10ML suspension TAKE TWO TEASPOONSFUL BY MOUTH 4 TIMES DAILY WITH MEALS AND AT BEDTIME 420 mL 0  . Cetirizine HCl 10 MG CAPS Take 10 mg by mouth daily.    Marland Kitchen doxazosin (CARDURA) 4 MG tablet Take 1 tablet (4 mg total) by mouth daily. 90 tablet 2  . ferrous sulfate 325 (65 FE) MG EC tablet Take 325 mg by mouth daily with breakfast.     . furosemide (LASIX) 40 MG tablet Take 1 tablet (40 mg total) by mouth daily. 30 tablet 3  . lisinopril (PRINIVIL,ZESTRIL) 2.5 MG tablet Take 1 tablet (2.5 mg total) by mouth daily. 30 tablet 12  . LORazepam (ATIVAN) 0.5 MG tablet TAKE ONE TABLET BY MOUTH EVERY 8 HOURS  AS NEEDED FOR ANXIETY 30 tablet 1  . metoprolol succinate (TOPROL-XL) 50 MG 24 hr tablet Take 1 tablet (50mg ) by mouth in the morning and 1/2 tablet (25mg )  in the evening. Take with or immediately following a meal. 45 tablet 6  . Multiple Vitamin (MULTIVITAMIN) tablet Take 1 tablet by mouth daily.    . pantoprazole (PROTONIX) 40 MG tablet Take 1 tablet (40 mg total) by mouth 2 (two) times daily. 60 tablet 6  . potassium chloride SA (K-DUR,KLOR-CON) 20 MEQ tablet Take 1 tablet (20 mEq total) by mouth 2 (two) times daily. 60 tablet 3  . PROAIR HFA 108 (90 Base) MCG/ACT inhaler INHALE TWO PUFFS INTO THE LUNGS EVERY 6 HOURS AS NEEDED FOR WHEEZING OR SHORTNESS OF BREATH 9 each 0  . tamsulosin (FLOMAX) 0.4 MG  CAPS capsule Take 1 capsule (0.4 mg total) by mouth daily. 90 capsule 3  . varenicline (CHANTIX STARTING MONTH PAK) 0.5 MG X 11 & 1 MG X 42 tablet Take one 0.5mg  tablet by mouth once daily for 3 days, then increase to one 0.5mg  tablet twice daily for 3 days, then increase to one 1mg  tablet twice daily. 53 tablet 0   No current facility-administered medications for this visit.   No Known Allergies   Exam:  BP 144/72 mmHg  Pulse 112  Wt 150 lb (68.04 kg)  SpO2 96%  Gen: Well NAD HEENT: EOMI,  MMM Lungs: Normal work of breathing. CTABL Heart: RRR no MRG Abd: NABS, Soft. Nondistended, Nontender Exts: Brisk capillary refill, warm and well perfused.  Left knee: Minimal effusion Normal motion with 1+ retropatellar crepitations Tender palpation lateral joint line Stable ligamentous exam  Negative McMurray's test Back: Nontender to spinal midline normal back motion negative slump test normal reflexes sensation and strength bilateral extremities.  Procedure: Real-time Ultrasound Guided Injection of left knee  Device: GE Logiq E  Images permanently stored and available for review in the ultrasound unit. Verbal informed consent obtained. Discussed risks and benefits of procedure. Warned about infection bleeding damage to structures skin hypopigmentation and fat atrophy among others. Patient expresses understanding and agreement Time-out conducted.  Noted no overlying erythema, induration, or other signs of local infection.  Skin prepped in a sterile fashion.  Local anesthesia: Topical Ethyl chloride.  With sterile technique and under real time ultrasound guidance: 80 mg of Kenalog and 4 mL of Marcaine injected easily.  Completed without difficulty  Pain immediately resolved suggesting accurate placement of the medication.  Advised to call if fevers/chills, erythema, induration, drainage, or persistent bleeding.  Images permanently stored and available for review in the  ultrasound unit.  Impression: Technically successful ultrasound guided injection.    No results found for this or any previous visit (from the past 24 hour(s)). No results found.    Assessment and Plan: 82 y.o. male with left knee pain. I'm not sure if the pain is related to DJD year or meniscus injury or some intrinsic knee issue or or related to lumbar radiculopathy. He does not have any positive lumbar radicular provocative testing. Plan for steroid knee injection. If not better with pursue lumbar radiculopathy is a possibility here future.  Discussed warning signs or symptoms. Please see discharge instructions. Patient expresses understanding.

## 2016-01-31 NOTE — Patient Instructions (Signed)
Thank you for coming in today.   Call or go to the ER if you develop a large red swollen joint with extreme pain or oozing puss.    Return as needed.  

## 2016-03-09 ENCOUNTER — Other Ambulatory Visit: Payer: Self-pay | Admitting: Family Medicine

## 2016-03-12 ENCOUNTER — Encounter: Payer: Self-pay | Admitting: Family Medicine

## 2016-03-13 ENCOUNTER — Telehealth (HOSPITAL_COMMUNITY): Payer: Self-pay | Admitting: *Deleted

## 2016-03-13 DIAGNOSIS — M9905 Segmental and somatic dysfunction of pelvic region: Secondary | ICD-10-CM | POA: Diagnosis not present

## 2016-03-13 DIAGNOSIS — M9904 Segmental and somatic dysfunction of sacral region: Secondary | ICD-10-CM | POA: Diagnosis not present

## 2016-03-13 DIAGNOSIS — M9903 Segmental and somatic dysfunction of lumbar region: Secondary | ICD-10-CM | POA: Diagnosis not present

## 2016-03-13 DIAGNOSIS — M5442 Lumbago with sciatica, left side: Secondary | ICD-10-CM | POA: Diagnosis not present

## 2016-03-13 DIAGNOSIS — M25552 Pain in left hip: Secondary | ICD-10-CM | POA: Diagnosis not present

## 2016-03-13 DIAGNOSIS — M461 Sacroiliitis, not elsewhere classified: Secondary | ICD-10-CM | POA: Diagnosis not present

## 2016-03-14 DIAGNOSIS — M5442 Lumbago with sciatica, left side: Secondary | ICD-10-CM | POA: Diagnosis not present

## 2016-03-14 DIAGNOSIS — M25552 Pain in left hip: Secondary | ICD-10-CM | POA: Diagnosis not present

## 2016-03-14 DIAGNOSIS — M9905 Segmental and somatic dysfunction of pelvic region: Secondary | ICD-10-CM | POA: Diagnosis not present

## 2016-03-14 DIAGNOSIS — M461 Sacroiliitis, not elsewhere classified: Secondary | ICD-10-CM | POA: Diagnosis not present

## 2016-03-14 DIAGNOSIS — M9903 Segmental and somatic dysfunction of lumbar region: Secondary | ICD-10-CM | POA: Diagnosis not present

## 2016-03-14 DIAGNOSIS — M9904 Segmental and somatic dysfunction of sacral region: Secondary | ICD-10-CM | POA: Diagnosis not present

## 2016-03-16 ENCOUNTER — Other Ambulatory Visit: Payer: Self-pay | Admitting: Family Medicine

## 2016-03-16 DIAGNOSIS — M5442 Lumbago with sciatica, left side: Secondary | ICD-10-CM | POA: Diagnosis not present

## 2016-03-16 DIAGNOSIS — M9903 Segmental and somatic dysfunction of lumbar region: Secondary | ICD-10-CM | POA: Diagnosis not present

## 2016-03-16 DIAGNOSIS — M25552 Pain in left hip: Secondary | ICD-10-CM | POA: Diagnosis not present

## 2016-03-16 DIAGNOSIS — M9904 Segmental and somatic dysfunction of sacral region: Secondary | ICD-10-CM | POA: Diagnosis not present

## 2016-03-16 DIAGNOSIS — M9905 Segmental and somatic dysfunction of pelvic region: Secondary | ICD-10-CM | POA: Diagnosis not present

## 2016-03-16 DIAGNOSIS — M461 Sacroiliitis, not elsewhere classified: Secondary | ICD-10-CM | POA: Diagnosis not present

## 2016-03-19 DIAGNOSIS — M25552 Pain in left hip: Secondary | ICD-10-CM | POA: Diagnosis not present

## 2016-03-19 DIAGNOSIS — M9904 Segmental and somatic dysfunction of sacral region: Secondary | ICD-10-CM | POA: Diagnosis not present

## 2016-03-19 DIAGNOSIS — M461 Sacroiliitis, not elsewhere classified: Secondary | ICD-10-CM | POA: Diagnosis not present

## 2016-03-19 DIAGNOSIS — M5442 Lumbago with sciatica, left side: Secondary | ICD-10-CM | POA: Diagnosis not present

## 2016-03-19 DIAGNOSIS — M9905 Segmental and somatic dysfunction of pelvic region: Secondary | ICD-10-CM | POA: Diagnosis not present

## 2016-03-19 DIAGNOSIS — M9903 Segmental and somatic dysfunction of lumbar region: Secondary | ICD-10-CM | POA: Diagnosis not present

## 2016-03-19 NOTE — Progress Notes (Signed)
HPI: FU coronary artery disease. Patient had an anteroseptal myocardial infarction in October Q000111Q complicated by cardiogenic shock. Cared for in [place]. He underwent emergent coronary artery bypass graft for left main disease and an occluded LAD. He had grafting of his LAD and ramus intermedius. His right coronary artery was occluded with left collaterals and was not grafted. There was a circumflex that rose anomalously from the right coronary cusp and had what was described as modest disease and was not grafted. He did have postoperative atrial fibrillation. Echocardiogram May 2017 showed ejection fraction 35-40% with akinesis of the anterior, septal, apical and inferior apical walls. 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 Follow-up recommended in 5 years. Patient did have postoperative atrial fibrillation following his bypass surgery and plan at last office visit was to discontinue anticoagulation if sinus persisted. Since last seen, Patient denies dyspnea, chest pain, syncope. He states he has problems with anxiety. He has felt his heart rate increased at times and has checked his pulse and it is in the 110 range. He is unclear as to whether he feels this is atrial fibrillation.  Current Outpatient Prescriptions  Medication Sig Dispense Refill  . apixaban (ELIQUIS) 5 MG TABS tablet Take 1 tablet (5 mg total) by mouth 2 (two) times daily. 60 tablet 6  . aspirin EC 81 MG tablet Take 1 tablet (81 mg total) by mouth daily.    Marland Kitchen atorvastatin (LIPITOR) 80 MG tablet Take 1 tablet (80 mg total) by mouth daily. 90 tablet 3  . CARAFATE 1 GM/10ML suspension TAKE  10 ML BY MOUTH 4 TIMES DAILY WITH MEALS AND AT BEDTIME 420 mL 0  . Cetirizine HCl 10 MG CAPS Take 10 mg by mouth daily.    Marland Kitchen doxazosin (CARDURA) 4 MG tablet Take 1 tablet (4 mg total) by mouth daily. 90 tablet 2  . ferrous sulfate 325 (65 FE) MG EC tablet Take 325 mg by mouth daily with breakfast.     . furosemide  (LASIX) 40 MG tablet Take 1 tablet (40 mg total) by mouth daily. 30 tablet 3  . lisinopril (PRINIVIL,ZESTRIL) 2.5 MG tablet Take 1 tablet (2.5 mg total) by mouth daily. 30 tablet 12  . LORazepam (ATIVAN) 0.5 MG tablet TAKE ONE TABLET BY MOUTH EVERY 8 HOURS AS NEEDED FOR ANXIETY 30 tablet 1  . metoprolol succinate (TOPROL-XL) 50 MG 24 hr tablet Take 1 tablet (50mg ) by mouth in the morning and 1/2 tablet (25mg )  in the evening. Take with or immediately following a meal. 45 tablet 6  . Multiple Vitamin (MULTIVITAMIN) tablet Take 1 tablet by mouth daily.    . pantoprazole (PROTONIX) 40 MG tablet Take 1 tablet (40 mg total) by mouth 2 (two) times daily. 60 tablet 6  . potassium chloride SA (K-DUR,KLOR-CON) 20 MEQ tablet Take 1 tablet (20 mEq total) by mouth 2 (two) times daily. 60 tablet 3  . PROAIR HFA 108 (90 Base) MCG/ACT inhaler INHALE TWO PUFFS INTO THE LUNGS EVERY 6 HOURS AS NEEDED FOR WHEEZING OR SHORTNESS OF BREATH 9 each 0  . PROAIR HFA 108 (90 Base) MCG/ACT inhaler INHALE TWO PUFFS INTO THE LUNGS EVERY 6 HOURS AS NEEDED FOR WHEEZING OR SHORTNESS OF BREATH 9 each 0  . tamsulosin (FLOMAX) 0.4 MG CAPS capsule Take 1 capsule (0.4 mg total) by mouth daily. 90 capsule 3   No current facility-administered medications for this visit.      Past Medical History:  Diagnosis  Date  . A-fib [facility]) 04/2015   after CABG.  Abixiban initiated.   . Acute blood loss anemia   . Anxiety    "occasionally" (11/03/2015)  . BPH (benign prostatic hyperplasia)   . CAD (coronary artery disease)   . Chronic bronchitis ([place])   . GERD (gastroesophageal reflux disease)   . Heart murmur   . High cholesterol   . Hypertension   . Myocardial infarction ([place]) 04/2015  . Pneumonia 05/2015   "after heart surgery"  . Pyloric ulcer   . Renal insufficiency     Past Surgical History:  Procedure Laterality Date  . CATARACT EXTRACTION W/ INTRAOCULAR LENS  IMPLANT, BILATERAL    . CORONARY ARTERY BYPASS GRAFT  04/2015    CABG "X4; [place]"  . ESOPHAGOGASTRODUODENOSCOPY N/A 11/06/2015   Procedure: ESOPHAGOGASTRODUODENOSCOPY (EGD);  Surgeon: Jerene Bears, MD;  Location: [facility] ENDOSCOPY;  Service: Endoscopy;  Laterality: N/A;  . HAND RECONSTRUCTION Bilateral 1970s   "tore up by garage door spring"  . INGUINAL HERNIA REPAIR Bilateral     Social History   Social History  . Marital status: Married    Spouse name: N/A  . Number of children: 3  . Years of education: N/A   Occupational History  . Not on file.   Social History Main Topics  . Smoking status: Current Some Day Smoker    Packs/day: 0.50    Years: 65.00    Types: Cigarettes  . Smokeless tobacco: Never Used  . Alcohol use 0.0 oz/week     Comment: 11/03/2015 "not drinking anything now"  . Drug use: No  . Sexual activity: No   Other Topics Concern  . Not on file   Social History Narrative  . No narrative on file    Family History  Problem Relation Age of Onset  . CAD Father   . Diabetes Sister   . Diabetes Brother     ROS: no fevers or chills, productive cough, hemoptysis, dysphasia, odynophagia, melena, hematochezia, dysuria, hematuria, rash, seizure activity, orthopnea, PND, pedal edema, claudication. Remaining systems are negative.  Physical Exam: Well-developed well-nourished in no acute distress.  Skin is warm and dry.  HEENT is normal.  Neck is supple.  Chest is clear to auscultation with normal expansion.  Cardiovascular exam is regular rate and rhythm.  Abdominal exam nontender or distended. No masses palpated. Extremities show no edema. neuro grossly intact  ECG Sinus rhythm at a rate of 96. Normal axis. Nonspecific ST changes.  A/P  1 Last disease-continue aspirin and statin. Follow-up abdominal ultrasound May 2022.  2 proximal atrial fibrillation-he has not had documented atrial fibrillation other than postoperatively following his bypass surgery. However he describes intermittent palpitations that may be  anxiety related but not clear. I will place an event monitor to correlate his symptoms with his palpitations. If no atrial fibrillation is documented we could consider discontinuing anticoagulation in the future. Increase Toprol to 50 mg twice a day.  3 hypertension-blood pressure is controlled. Continue present medications.  4 hyperlipidemia-continue statin.  5 Chronic systolic congestive heart failure-continue present dose of Lasix.  6 ischemic cardiomyopathy-continue beta blocker and ACE inhibitor.  7 tobacco abuse-patient counseled on discontinuing. He has not smoked in the past 5 days.  Kirk Ruths, MD

## 2016-03-21 ENCOUNTER — Encounter: Payer: Self-pay | Admitting: Cardiology

## 2016-03-21 ENCOUNTER — Ambulatory Visit (INDEPENDENT_AMBULATORY_CARE_PROVIDER_SITE_OTHER): Payer: Medicare Other | Admitting: Cardiology

## 2016-03-21 VITALS — BP 128/62 | HR 103 | Ht 69.0 in | Wt 148.4 lb

## 2016-03-21 DIAGNOSIS — I255 Ischemic cardiomyopathy: Secondary | ICD-10-CM | POA: Diagnosis not present

## 2016-03-21 DIAGNOSIS — E785 Hyperlipidemia, unspecified: Secondary | ICD-10-CM | POA: Diagnosis not present

## 2016-03-21 DIAGNOSIS — I2510 Atherosclerotic heart disease of native coronary artery without angina pectoris: Secondary | ICD-10-CM | POA: Diagnosis not present

## 2016-03-21 DIAGNOSIS — Z720 Tobacco use: Secondary | ICD-10-CM

## 2016-03-21 DIAGNOSIS — I10 Essential (primary) hypertension: Secondary | ICD-10-CM | POA: Diagnosis not present

## 2016-03-21 DIAGNOSIS — I480 Paroxysmal atrial fibrillation: Secondary | ICD-10-CM

## 2016-03-21 MED ORDER — METOPROLOL SUCCINATE ER 50 MG PO TB24: ORAL_TABLET | ORAL | 6 refills | Status: DC

## 2016-03-21 NOTE — Patient Instructions (Addendum)
Your physician has recommended you make the following change in your medication: INCREASE  METOPROLOL TO 50 MG  TWICE  DAILY    Your physician recommends that you schedule a follow-up appointment in:  [place] has recommended that you wear an event monitor. Event monitors are medical devices that record the heart's electrical activity. Doctors most often [place] these monitors to diagnose arrhythmias. Arrhythmias are problems with the speed or rhythm of the heartbeat. The monitor is a small, portable device. You can wear one while you do your normal daily activities. This is usually used to diagnose what is causing palpitations/syncope (passing out).

## 2016-03-28 DIAGNOSIS — M9905 Segmental and somatic dysfunction of pelvic region: Secondary | ICD-10-CM | POA: Diagnosis not present

## 2016-03-28 DIAGNOSIS — M461 Sacroiliitis, not elsewhere classified: Secondary | ICD-10-CM | POA: Diagnosis not present

## 2016-03-28 DIAGNOSIS — M9904 Segmental and somatic dysfunction of sacral region: Secondary | ICD-10-CM | POA: Diagnosis not present

## 2016-03-28 DIAGNOSIS — M5442 Lumbago with sciatica, left side: Secondary | ICD-10-CM | POA: Diagnosis not present

## 2016-03-28 DIAGNOSIS — M25552 Pain in left hip: Secondary | ICD-10-CM | POA: Diagnosis not present

## 2016-03-28 DIAGNOSIS — M9903 Segmental and somatic dysfunction of lumbar region: Secondary | ICD-10-CM | POA: Diagnosis not present

## 2016-04-03 ENCOUNTER — Other Ambulatory Visit: Payer: Self-pay | Admitting: Family Medicine

## 2016-04-05 ENCOUNTER — Telehealth: Payer: Self-pay | Admitting: Internal Medicine

## 2016-04-05 NOTE — Telephone Encounter (Signed)
Discussed with pts wife that she can call [facility] office and if they can see the orders and do the labs that will be fine. Wife will call the office.

## 2016-04-16 ENCOUNTER — Other Ambulatory Visit: Payer: Self-pay | Admitting: Family Medicine

## 2016-04-16 NOTE — Telephone Encounter (Signed)
Is refill appropriate? Please fill if so.

## 2016-04-26 ENCOUNTER — Other Ambulatory Visit (INDEPENDENT_AMBULATORY_CARE_PROVIDER_SITE_OTHER): Payer: Medicare Other

## 2016-04-26 ENCOUNTER — Ambulatory Visit (INDEPENDENT_AMBULATORY_CARE_PROVIDER_SITE_OTHER): Payer: Medicare Other

## 2016-04-26 DIAGNOSIS — I480 Paroxysmal atrial fibrillation: Secondary | ICD-10-CM | POA: Diagnosis not present

## 2016-04-26 DIAGNOSIS — D62 Acute posthemorrhagic anemia: Secondary | ICD-10-CM

## 2016-04-26 LAB — IBC PANEL
Iron: 98 ug/dL (ref 42–165)
SATURATION RATIOS: 31.1 % (ref 20.0–50.0)
TRANSFERRIN: 225 mg/dL (ref 212.0–360.0)

## 2016-04-26 LAB — FERRITIN: FERRITIN: 37.3 ng/mL (ref 22.0–322.0)

## 2016-04-26 LAB — CBC WITH DIFFERENTIAL/PLATELET
BASOS ABS: 0 10*3/uL (ref 0.0–0.1)
Basophils Relative: 0.4 % (ref 0.0–3.0)
EOS ABS: 0.5 10*3/uL (ref 0.0–0.7)
Eosinophils Relative: 5 % (ref 0.0–5.0)
HEMATOCRIT: 41.4 % (ref 39.0–52.0)
Hemoglobin: 14 g/dL (ref 13.0–17.0)
LYMPHS PCT: 20.3 % (ref 12.0–46.0)
Lymphs Abs: 2.1 10*3/uL (ref 0.7–4.0)
MCHC: 33.8 g/dL (ref 30.0–36.0)
MCV: 95.3 fl (ref 78.0–100.0)
MONOS PCT: 9.6 % (ref 3.0–12.0)
Monocytes Absolute: 1 10*3/uL (ref 0.1–1.0)
NEUTROS ABS: 6.6 10*3/uL (ref 1.4–7.7)
Neutrophils Relative %: 64.7 % (ref 43.0–77.0)
PLATELETS: 243 10*3/uL (ref 150.0–400.0)
RBC: 4.34 Mil/uL (ref 4.22–5.81)
RDW: 14.6 % (ref 11.5–15.5)
WBC: 10.1 10*3/uL (ref 4.0–10.5)

## 2016-04-30 ENCOUNTER — Other Ambulatory Visit: Payer: Self-pay | Admitting: Family Medicine

## 2016-05-10 DIAGNOSIS — L570 Actinic keratosis: Secondary | ICD-10-CM | POA: Diagnosis not present

## 2016-05-10 DIAGNOSIS — Z85828 Personal history of other malignant neoplasm of skin: Secondary | ICD-10-CM | POA: Diagnosis not present

## 2016-05-10 DIAGNOSIS — D485 Neoplasm of uncertain behavior of skin: Secondary | ICD-10-CM | POA: Diagnosis not present

## 2016-05-10 DIAGNOSIS — C44319 Basal cell carcinoma of skin of other parts of face: Secondary | ICD-10-CM | POA: Diagnosis not present

## 2016-05-10 DIAGNOSIS — Z08 Encounter for follow-up examination after completed treatment for malignant neoplasm: Secondary | ICD-10-CM | POA: Diagnosis not present

## 2016-05-18 ENCOUNTER — Other Ambulatory Visit: Payer: Self-pay | Admitting: Family Medicine

## 2016-05-18 DIAGNOSIS — M9904 Segmental and somatic dysfunction of sacral region: Secondary | ICD-10-CM | POA: Diagnosis not present

## 2016-05-18 DIAGNOSIS — M9905 Segmental and somatic dysfunction of pelvic region: Secondary | ICD-10-CM | POA: Diagnosis not present

## 2016-05-18 DIAGNOSIS — M461 Sacroiliitis, not elsewhere classified: Secondary | ICD-10-CM | POA: Diagnosis not present

## 2016-05-18 DIAGNOSIS — M25552 Pain in left hip: Secondary | ICD-10-CM | POA: Diagnosis not present

## 2016-05-18 DIAGNOSIS — S39012A Strain of muscle, fascia and tendon of lower back, initial encounter: Secondary | ICD-10-CM | POA: Diagnosis not present

## 2016-05-18 DIAGNOSIS — M9903 Segmental and somatic dysfunction of lumbar region: Secondary | ICD-10-CM | POA: Diagnosis not present

## 2016-05-19 DIAGNOSIS — S39012A Strain of muscle, fascia and tendon of lower back, initial encounter: Secondary | ICD-10-CM | POA: Diagnosis not present

## 2016-05-19 DIAGNOSIS — M9904 Segmental and somatic dysfunction of sacral region: Secondary | ICD-10-CM | POA: Diagnosis not present

## 2016-05-19 DIAGNOSIS — M461 Sacroiliitis, not elsewhere classified: Secondary | ICD-10-CM | POA: Diagnosis not present

## 2016-05-19 DIAGNOSIS — M25552 Pain in left hip: Secondary | ICD-10-CM | POA: Diagnosis not present

## 2016-05-19 DIAGNOSIS — M9905 Segmental and somatic dysfunction of pelvic region: Secondary | ICD-10-CM | POA: Diagnosis not present

## 2016-05-19 DIAGNOSIS — M9903 Segmental and somatic dysfunction of lumbar region: Secondary | ICD-10-CM | POA: Diagnosis not present

## 2016-05-22 DIAGNOSIS — C44319 Basal cell carcinoma of skin of other parts of face: Secondary | ICD-10-CM | POA: Diagnosis not present

## 2016-05-22 DIAGNOSIS — L570 Actinic keratosis: Secondary | ICD-10-CM | POA: Diagnosis not present

## 2016-06-07 NOTE — Progress Notes (Signed)
HPI: FU coronary artery disease. Patient had an anteroseptal myocardial infarction in October 1610 complicated by cardiogenic shock. Cared for in [place]. He underwent emergent coronary artery bypass graft for left main disease and an occluded LAD. He had grafting of his LAD and ramus intermedius. His right coronary artery was occluded with left collaterals and was not grafted. There was a circumflex that rose anomalously from the right coronary cusp and had what was described as modest disease and was not grafted. He did have postoperative atrial fibrillation. Echocardiogram May 2017 showed ejection fraction 35-40% with akinesis of the anterior, septal, apical and inferior apical walls. 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 Follow-up recommended in 5 years. Patient did have postoperative atrial fibrillation following his bypass surgery. He was having occasional palpitations at last office visit. Event monitor October 2017 showed sinus rhythm with PACs and PVCs. Since last seen, patient denies dyspnea, chest pain, palpitations or syncope.  Current Outpatient Prescriptions  Medication Sig Dispense Refill  . apixaban (ELIQUIS) 5 MG TABS tablet Take 1 tablet (5 mg total) by mouth 2 (two) times daily. 60 tablet 6  . aspirin EC 81 MG tablet Take 1 tablet (81 mg total) by mouth daily.    Marland Kitchen atorvastatin (LIPITOR) 80 MG tablet Take 1 tablet (80 mg total) by mouth daily. 90 tablet 3  . CARAFATE 1 GM/10ML suspension TAKE 10 MLS BY MOUTH FOUR TIMES DAILY WITH MEALS AND AT BEDTIME 420 mL 0  . Cetirizine HCl 10 MG CAPS Take 10 mg by mouth daily.    Marland Kitchen doxazosin (CARDURA) 4 MG tablet Take 1 tablet (4 mg total) by mouth daily. 90 tablet 2  . furosemide (LASIX) 40 MG tablet Take 1 tablet (40 mg total) by mouth daily. 30 tablet 3  . KLOR-CON M20 20 MEQ tablet TAKE ONE TABLET BY MOUTH TWICE DAILY 60 tablet 3  . lisinopril (PRINIVIL,ZESTRIL) 2.5 MG tablet Take 1 tablet (2.5 mg total) by  mouth daily. 30 tablet 12  . LORazepam (ATIVAN) 0.5 MG tablet TAKE ONE TABLET BY MOUTH EVERY 8 HOURS AS NEEDED FOR ANXIETY 30 tablet 5  . metoprolol succinate (TOPROL-XL) 50 MG 24 hr tablet 1 TAB  TWICE DAILY 60 tablet 6  . Multiple Vitamin (MULTIVITAMIN) tablet Take 1 tablet by mouth daily.    . pantoprazole (PROTONIX) 40 MG tablet Take 1 tablet (40 mg total) by mouth 2 (two) times daily. 180 tablet 2  . PROAIR HFA 108 (90 Base) MCG/ACT inhaler INHALE TWO PUFFS INTO THE LUNGS EVERY 6 HOURS AS NEEDED FOR WHEEZING OR SHORTNESS OF BREATH 9 each 0  . tamsulosin (FLOMAX) 0.4 MG CAPS capsule Take 1 capsule (0.4 mg total) by mouth daily. 90 capsule 3   No current facility-administered medications for this visit.      Past Medical History:  Diagnosis Date  . A-fib ([place]) 04/2015   after CABG.  Abixiban initiated.   . Acute blood loss anemia   . Anxiety    "occasionally" (11/03/2015)  . BPH (benign prostatic hyperplasia)   . CAD (coronary artery disease)   . Chronic bronchitis ([place])   . GERD (gastroesophageal reflux disease)   . Heart murmur   . High cholesterol   . Hypertension   . Myocardial infarction 04/2015  . Pneumonia 05/2015   "after heart surgery"  . Pyloric ulcer   . Renal insufficiency     Past Surgical History:  Procedure Laterality Date  . CATARACT EXTRACTION  W/ INTRAOCULAR LENS  IMPLANT, BILATERAL    . CORONARY ARTERY BYPASS GRAFT  04/2015   CABG "X4; [place]"  . ESOPHAGOGASTRODUODENOSCOPY N/A 11/06/2015   Procedure: ESOPHAGOGASTRODUODENOSCOPY (EGD);  Surgeon: Jerene Bears, MD;  Location: [facility] ENDOSCOPY;  Service: Endoscopy;  Laterality: N/A;  . HAND RECONSTRUCTION Bilateral 1970s   "tore up by garage door spring"  . INGUINAL HERNIA REPAIR Bilateral     Social History   Social History  . Marital status: Married    Spouse name: N/A  . Number of children: 3  . Years of education: N/A   Occupational History  . Not on file.   Social History Main Topics  .  Smoking status: Current Some Day Smoker    Packs/day: 0.50    Years: 65.00    Types: Cigarettes  . Smokeless tobacco: Never Used  . Alcohol use 0.0 oz/week     Comment: 11/03/2015 "not drinking anything now"  . Drug use: No  . Sexual activity: No   Other Topics Concern  . Not on file   Social History Narrative  . No narrative on file    Family History  Problem Relation Age of Onset  . CAD Father   . Diabetes Sister   . Diabetes Brother     ROS: no fevers or chills, productive cough, hemoptysis, dysphasia, odynophagia, melena, hematochezia, dysuria, hematuria, rash, seizure activity, orthopnea, PND, pedal edema, claudication. Remaining systems are negative.  Physical Exam: Well-developed well-nourished in no acute distress.  Skin is warm and dry.  HEENT is normal.  Neck is supple.  Chest is clear to auscultation with normal expansion.  Cardiovascular exam is regular rate and rhythm.  Abdominal exam nontender or distended. No masses palpated. Extremities show no edema. neuro grossly intact  ECG  A/P  1 Coronary artery disease-continue aspirin and statin.  2 paroxysmal atrial fibrillation-patient had postoperative atrial fibrillation but has not had recurrence. Monitor showed no atrial fibrillation. Continue beta blocker. Discontinue anticoagulation.  3 hypertension-blood pressure controlled. Continue present medications.  4 peripheral vascular disease-repeat abdominal ultrasound May 2022.  5 hyperlipidemia-continue statin.  6 ischemic cardiopathy-continue beta blocker and ACE inhibitor.  7 chronic systolic congestive heart failure-continue present dose of Lasix.  8 tobacco abuse-patient counseled on discontinuing.     Kirk Ruths, MD

## 2016-06-12 ENCOUNTER — Encounter: Payer: Self-pay | Admitting: Family Medicine

## 2016-06-13 ENCOUNTER — Ambulatory Visit (INDEPENDENT_AMBULATORY_CARE_PROVIDER_SITE_OTHER): Payer: Medicare Other | Admitting: Cardiology

## 2016-06-13 ENCOUNTER — Encounter: Payer: Self-pay | Admitting: Cardiology

## 2016-06-13 VITALS — BP 125/77 | HR 80 | Ht 69.0 in | Wt 151.1 lb

## 2016-06-13 DIAGNOSIS — I10 Essential (primary) hypertension: Secondary | ICD-10-CM

## 2016-06-13 DIAGNOSIS — E7800 Pure hypercholesterolemia, unspecified: Secondary | ICD-10-CM | POA: Diagnosis not present

## 2016-06-13 DIAGNOSIS — I25810 Atherosclerosis of coronary artery bypass graft(s) without angina pectoris: Secondary | ICD-10-CM | POA: Diagnosis not present

## 2016-06-13 DIAGNOSIS — Z720 Tobacco use: Secondary | ICD-10-CM | POA: Diagnosis not present

## 2016-06-13 DIAGNOSIS — I255 Ischemic cardiomyopathy: Secondary | ICD-10-CM | POA: Diagnosis not present

## 2016-06-13 MED ORDER — PANTOPRAZOLE SODIUM 40 MG PO TBEC: 40.0000 mg | DELAYED_RELEASE_TABLET | Freq: Two times a day (BID) | ORAL | 2 refills | Status: DC

## 2016-06-13 NOTE — Patient Instructions (Signed)
Medication Instructions:   STOP ELIQUIS  Follow-Up:  Your physician wants you to follow-up in: [place] will receive a reminder letter in the mail two months in advance. If you don't receive a letter, please call our office to schedule the follow-up appointment.   If you need a refill on your cardiac medications before your next appointment, please call your pharmacy.   ALIVECOR TO MONITOR HEART RHYTHM

## 2016-06-21 ENCOUNTER — Encounter: Payer: Self-pay | Admitting: Family Medicine

## 2016-06-22 ENCOUNTER — Other Ambulatory Visit: Payer: Self-pay | Admitting: Family Medicine

## 2016-06-26 ENCOUNTER — Ambulatory Visit (INDEPENDENT_AMBULATORY_CARE_PROVIDER_SITE_OTHER): Payer: Medicare Other | Admitting: Family Medicine

## 2016-06-26 ENCOUNTER — Encounter: Payer: Self-pay | Admitting: Family Medicine

## 2016-06-26 VITALS — BP 118/60 | HR 93 | Wt 158.0 lb

## 2016-06-26 DIAGNOSIS — Z23 Encounter for immunization: Secondary | ICD-10-CM

## 2016-06-26 DIAGNOSIS — N401 Enlarged prostate with lower urinary tract symptoms: Secondary | ICD-10-CM | POA: Diagnosis not present

## 2016-06-26 DIAGNOSIS — I255 Ischemic cardiomyopathy: Secondary | ICD-10-CM

## 2016-06-26 DIAGNOSIS — R350 Frequency of micturition: Secondary | ICD-10-CM | POA: Diagnosis not present

## 2016-06-26 DIAGNOSIS — N411 Chronic prostatitis: Secondary | ICD-10-CM | POA: Diagnosis not present

## 2016-06-26 LAB — COMPLETE METABOLIC PANEL WITH GFR
ALBUMIN: 4 g/dL (ref 3.6–5.1)
ALK PHOS: 86 U/L (ref 40–115)
ALT: 22 U/L (ref 9–46)
AST: 20 U/L (ref 10–35)
BILIRUBIN TOTAL: 0.4 mg/dL (ref 0.2–1.2)
BUN: 16 mg/dL (ref 7–25)
CALCIUM: 8.8 mg/dL (ref 8.6–10.3)
CO2: 27 mmol/L (ref 20–31)
CREATININE: 1.45 mg/dL — AB (ref 0.70–1.11)
Chloride: 104 mmol/L (ref 98–110)
GFR, Est African American: 52 mL/min — ABNORMAL LOW (ref >=60)
GFR, Est Non African American: 45 mL/min — ABNORMAL LOW (ref >=60)
GLUCOSE: 96 mg/dL (ref 65–99)
Potassium: 4.2 mmol/L (ref 3.5–5.3)
Sodium: 139 mmol/L (ref 135–146)
TOTAL PROTEIN: 6.4 g/dL (ref 6.1–8.1)

## 2016-06-26 LAB — PSA: PSA: 0.7 ng/mL (ref <=4.0)

## 2016-06-26 MED ORDER — SUCRALFATE 1 G PO TABS: 1.0000 g | ORAL_TABLET | Freq: Four times a day (QID) | ORAL | 3 refills | Status: DC

## 2016-06-26 MED ORDER — CIPROFLOXACIN HCL 500 MG PO TABS: 500.0000 mg | ORAL_TABLET | Freq: Two times a day (BID) | ORAL | 1 refills | Status: DC

## 2016-06-26 NOTE — Patient Instructions (Signed)
Thank you for coming in today. Get labs today,  Start cipro antibiotic.  Recheck in 2-3 months.    Prostatitis Prostatitis is swelling or inflammation of the prostate gland. The prostate is a walnut-sized gland that is involved in the production of semen. It is located below a man's bladder, in front of the rectum. There are four types of prostatitis:  Chronic nonbacterial prostatitis. This is the most common type of prostatitis. It may be associated with a viral infection or autoimmune disorder.  Acute bacterial prostatitis. This is the least common type of prostatitis. It starts quickly and is usually associated with a bladder infection, high fever, and shaking chills. It can occur at any age.  Chronic bacterial prostatitis. This type usually results from acute bacterial prostatitis that happens repeatedly (is recurrent) or has not been treated properly. It can occur in men of any age but is most common among middle-aged men whose prostate has begun to get larger. The symptoms are not as severe as symptoms caused by acute bacterial prostatitis.  Prostatodynia or chronic pelvic pain syndrome (CPPS). This type is also called pelvic floor disorder. It is associated with increased muscular tone in the pelvis surrounding the prostate. What are the causes? Bacterial prostatitis is caused by infection from bacteria. Chronic nonbacterial prostatitis may be caused by:  Urinary tract infections (UTIs).  Nerve damage.  A response by the body's disease-fighting system (autoimmune response).  Chemicals in the urine. The causes of the other types of prostatitis are usually not known. What are the signs or symptoms? Symptoms of this condition vary depending upon the type of prostatitis. If you have acute bacterial prostatitis, you may experience:  Urinary symptoms, such as:  Painful urination.  Burning during urination.  Frequent and sudden urges to urinate.  Inability to start  urinating.  A weak or interrupted stream of urine.  Vomiting.  Nausea.  Fever.  Chills.  Inability to empty the bladder completely.  Pain in the:  Muscles or joints.  Lower back.  Lower abdomen. If you have any of the other types of prostatitis, you may experience:  Urinary symptoms, such as:  Sudden urges to urinate.  Frequent urination.  Difficulty starting urination.  Weak urine stream.  Dribbling after urination.  Discharge from the urethra. The urethra is a tube that opens at the end of the penis.  Pain in the:  Testicles.  Penis or tip of the penis.  Rectum.  Area in front of the rectum and below the scrotum (perineum).  Problems with sexual function.  Painful ejaculation.  Bloody semen. How is this diagnosed? This condition may be diagnosed based on:  A physical and medical exam.  Your symptoms.  A urine test to check for bacteria.  An exam in which a health care provider uses a finger to feel the prostate (digital rectal exam).  A test of a sample of semen.  Blood tests.  Ultrasound.  Removal of prostate tissue to be examined under a microscope (biopsy).  Tests to check how your body handles urine (urodynamic tests).  A test to look inside your bladder or urethra (cystoscopy). How is this treated? Treatment for this condition depends on the type of prostatitis. Treatment may involve:  Medicines to relieve pain or inflammation.  Medicines to help relax your muscles.  Physical therapy.  Heat therapy.  Techniques to help you control certain body functions (biofeedback).  Relaxation exercises.  Antibiotic medicine, if your condition is caused by bacteria.  Warm water  baths (sitz baths). Sitz baths help with relaxing your pelvic floor muscles, which helps to relieve pressure on the prostate. Follow these instructions at home:  Take over-the-counter and prescription medicines only as told by your health care  provider.  If you were prescribed an antibiotic, take it as told by your health care provider. Do not stop taking the antibiotic even if you start to feel better.  If physical therapy, biofeedback, or relaxation exercises were prescribed, do exercises as instructed.  Take sitz baths as directed by your health care provider. For a sitz bath, sit in warm water that is deep enough to cover your hips and buttocks.  Keep all follow-up visits as told by your health care provider. This is important. Contact a health care provider if:  Your symptoms get worse.  You have a fever. Get help right away if:  You have chills.  You feel nauseous.  You vomit.  You feel light-headed or feel like you are going to faint.  You are unable to urinate.  You have blood or blood clots in your urine. This information is not intended to replace advice given to you by your health care provider. Make sure you discuss any questions you have with your health care provider. Document Released: 07/06/2000 Document Revised: 03/29/2016 Document Reviewed: 03/29/2016 Elsevier Interactive Patient Education  2017 [employer].

## 2016-06-26 NOTE — Progress Notes (Signed)
Bradley Stanley is a 82 y.o. male who presents to [place]: [place] today for follow-up prostate.   Patient has a history of urinary frequency urgency and some dysuria. He attributes this to enlarged prostate. He notes this is been partially worked up in the past by previous doctors. He feels reasonably well but does note continued bothersome urgency hesitancy and incomplete voiding. He currently takes tamsulosin which helps some. In the past has been treated with antibiotics. He notes this helps his symptoms considerably however the symptoms returned after the antibiotics finished.  AUA symptom score: 21/35 Quality-of-life score 4/6   Past Medical History:  Diagnosis Date  . A-fib ([place]) 04/2015   after CABG.  Abixiban initiated.   . Acute blood loss anemia   . Anxiety    "occasionally" (11/03/2015)  . BPH (benign prostatic hyperplasia)   . CAD (coronary artery disease)   . Chronic bronchitis ([place])   . GERD (gastroesophageal reflux disease)   . Heart murmur   . High cholesterol   . Hypertension   . Myocardial infarction 04/2015  . Pneumonia 05/2015   "after heart surgery"  . Pyloric ulcer   . Renal insufficiency    Past Surgical History:  Procedure Laterality Date  . CATARACT EXTRACTION W/ INTRAOCULAR LENS  IMPLANT, BILATERAL    . CORONARY ARTERY BYPASS GRAFT  04/2015   CABG "X4; [place]"  . ESOPHAGOGASTRODUODENOSCOPY N/A 11/06/2015   Procedure: ESOPHAGOGASTRODUODENOSCOPY (EGD);  Surgeon: Jerene Bears, MD;  Location: [facility] ENDOSCOPY;  Service: Endoscopy;  Laterality: N/A;  . HAND RECONSTRUCTION Bilateral 1970s   "tore up by garage door spring"  . INGUINAL HERNIA REPAIR Bilateral    Social History  Substance Use Topics  . Smoking status: Current Some Day Smoker    Packs/day: 0.50    Years: 65.00    Types: Cigarettes  . Smokeless tobacco: Never Used  . Alcohol  use 0.0 oz/week     Comment: 11/03/2015 "not drinking anything now"   family history includes CAD in his father; Diabetes in his brother and sister.  ROS as above:  Medications: Current Outpatient Prescriptions  Medication Sig Dispense Refill  . aspirin EC 81 MG tablet Take 1 tablet (81 mg total) by mouth daily.    Marland Kitchen atorvastatin (LIPITOR) 80 MG tablet Take 1 tablet (80 mg total) by mouth daily. 90 tablet 3  . Cetirizine HCl 10 MG CAPS Take 10 mg by mouth daily.    Marland Kitchen doxazosin (CARDURA) 4 MG tablet Take 1 tablet (4 mg total) by mouth daily. 90 tablet 2  . furosemide (LASIX) 40 MG tablet TAKE ONE TABLET BY MOUTH ONCE DAILY 30 tablet 3  . KLOR-CON M20 20 MEQ tablet TAKE ONE TABLET BY MOUTH TWICE DAILY 60 tablet 3  . lisinopril (PRINIVIL,ZESTRIL) 2.5 MG tablet Take 1 tablet (2.5 mg total) by mouth daily. 30 tablet 12  . LORazepam (ATIVAN) 0.5 MG tablet TAKE ONE TABLET BY MOUTH EVERY 8 HOURS AS NEEDED FOR ANXIETY 30 tablet 5  . metoprolol succinate (TOPROL-XL) 50 MG 24 hr tablet 1 TAB  TWICE DAILY 60 tablet 6  . Multiple Vitamin (MULTIVITAMIN) tablet Take 1 tablet by mouth daily.    . pantoprazole (PROTONIX) 40 MG tablet Take 1 tablet (40 mg total) by mouth 2 (two) times daily. 180 tablet 2  . PROAIR HFA 108 (90 Base) MCG/ACT inhaler INHALE TWO PUFFS INTO THE LUNGS EVERY 6 HOURS AS NEEDED FOR WHEEZING  OR SHORTNESS OF BREATH 9 each 0  . tamsulosin (FLOMAX) 0.4 MG CAPS capsule Take 1 capsule (0.4 mg total) by mouth daily. 90 capsule 3  . ciprofloxacin (CIPRO) 500 MG tablet Take 1 tablet (500 mg total) by mouth 2 (two) times daily. 60 tablet 1  . sucralfate (CARAFATE) 1 g tablet Take 1 tablet (1 g total) by mouth 4 (four) times daily. 120 tablet 3   No current facility-administered medications for this visit.    No Known Allergies  Health Maintenance Health Maintenance  Topic Date Due  . TETANUS/TDAP  02[birth year]/1954  . ZOSTAVAX  02/09/1995  . INFLUENZA VACCINE  02/21/2016  . PNA vac Low Risk  Adult (2 of 2 - PCV13) 05/23/2016     Exam:  BP 118/60   Pulse 93   Wt 158 lb (71.7 kg)   SpO2 100%   BMI 23.33 kg/m  Gen: Well NAD HEENT: EOMI,  MMM Lungs: Normal work of breathing. CTABL Heart: RRR no MRG Abd: NABS, Soft. Nondistended, Nontender Exts: Brisk capillary refill, warm and well perfused.  Rectal exam: Normal appearing anus. Normal rectal tone. Prostate is enlarged boggy and tender   No results found for this or any previous visit (from the past 72 hour(s)). No results found.    Assessment and Plan: 82 y.o. male with  Prostatitis: Likely combination of BPH and chronic prostatitis. We did obtain labs listed below and treat empirically with Cipro. Check metabolic panel to confirm kidney function for Cipro dosing safety. Recheck in a few months.  Influenza vaccine given prior to discharge. Orders Placed This Encounter  Procedures  . Urine culture  . Flu Vaccine QUAD 36+ mos IM  . PSA  . COMPLETE METABOLIC PANEL WITH GFR    Discussed warning signs or symptoms. Please see discharge instructions. Patient expresses understanding.

## 2016-06-27 ENCOUNTER — Encounter: Payer: Self-pay | Admitting: Family Medicine

## 2016-06-27 LAB — URINE CULTURE: Organism ID, Bacteria: NO GROWTH

## 2016-07-23 ENCOUNTER — Other Ambulatory Visit: Payer: Self-pay | Admitting: Family Medicine

## 2016-07-25 ENCOUNTER — Other Ambulatory Visit: Payer: Self-pay | Admitting: Family Medicine

## 2016-07-25 DIAGNOSIS — N410 Acute prostatitis: Secondary | ICD-10-CM

## 2016-07-31 DIAGNOSIS — C44319 Basal cell carcinoma of skin of other parts of face: Secondary | ICD-10-CM | POA: Diagnosis not present

## 2016-07-31 DIAGNOSIS — D485 Neoplasm of uncertain behavior of skin: Secondary | ICD-10-CM | POA: Diagnosis not present

## 2016-08-16 DIAGNOSIS — C44319 Basal cell carcinoma of skin of other parts of face: Secondary | ICD-10-CM | POA: Diagnosis not present

## 2016-08-27 ENCOUNTER — Telehealth: Payer: Self-pay

## 2016-08-27 NOTE — Telephone Encounter (Signed)
Bradley Stanley left a VM stating that he is the pt son and that the pt wife Bradley Stanley is in the hospital and he is unsure about the pts medication regimen. He requested a call back to review pts information. Called and left vm for Bradley requesting a call back advising that the pt needs to be present for the conversation as he is not listed on the pts DPR. Callback information provided. Lynne Leader, MD reviewed current medication list and advised that pt should be taken medications accordingly.

## 2016-08-28 ENCOUNTER — Ambulatory Visit: Payer: Medicare Other | Admitting: Family Medicine

## 2016-09-11 ENCOUNTER — Other Ambulatory Visit: Payer: Self-pay | Admitting: Family Medicine

## 2016-09-21 ENCOUNTER — Other Ambulatory Visit: Payer: Self-pay | Admitting: Family Medicine

## 2016-10-10 ENCOUNTER — Other Ambulatory Visit: Payer: Self-pay | Admitting: Cardiology

## 2016-10-15 ENCOUNTER — Ambulatory Visit (INDEPENDENT_AMBULATORY_CARE_PROVIDER_SITE_OTHER): Payer: Medicare Other | Admitting: Family Medicine

## 2016-10-15 VITALS — BP 158/64 | HR 80 | Temp 97.7°F | Wt 153.0 lb

## 2016-10-15 DIAGNOSIS — J0101 Acute recurrent maxillary sinusitis: Secondary | ICD-10-CM | POA: Diagnosis not present

## 2016-10-15 MED ORDER — AZITHROMYCIN 250 MG PO TABS
250.0000 mg | ORAL_TABLET | Freq: Every day | ORAL | 0 refills | Status: DC
Start: 2016-10-15 — End: 2017-03-11

## 2016-10-15 MED ORDER — IPRATROPIUM BROMIDE 0.06 % NA SOLN: 2.0000 | NASAL | 6 refills | Status: DC | PRN

## 2016-10-15 NOTE — Progress Notes (Signed)
Bradley Stanley is a 83 y.o. male who presents to [place]: [place] today for 1 day history of sore throat and right ear pain.  He states it feels scratchy and makes swallowing painful.  This feels like sinus infections he has had before.  He has been taking OTC allergy meds daily this spring and doesn't remember the name.  They have helped some but not enough.  He denies change in appetite, cough, SOB, or fever.    He is primarily concerned about getting his wife sick since she just returned home from the hospital 1.5 weeks ago.  She had to be intubated and he doesn't want her to catch anything he might have.  It has been a stressful time for them.     Past Medical History:  Diagnosis Date  . A-fib ([place]) 04/2015   after CABG.  Abixiban initiated.   . Acute blood loss anemia   . Anxiety    "occasionally" (11/03/2015)  . BPH (benign prostatic hyperplasia)   . CAD (coronary artery disease)   . Chronic bronchitis ([place])   . GERD (gastroesophageal reflux disease)   . Heart murmur   . High cholesterol   . Hypertension   . Myocardial infarction 04/2015  . Pneumonia 05/2015   "after heart surgery"  . Pyloric ulcer   . Renal insufficiency    Past Surgical History:  Procedure Laterality Date  . CATARACT EXTRACTION W/ INTRAOCULAR LENS  IMPLANT, BILATERAL    . CORONARY ARTERY BYPASS GRAFT  04/2015   CABG "X4; [place]"  . ESOPHAGOGASTRODUODENOSCOPY N/A 11/06/2015   Procedure: ESOPHAGOGASTRODUODENOSCOPY (EGD);  Surgeon: Jerene Bears, MD;  Location: [facility] ENDOSCOPY;  Service: Endoscopy;  Laterality: N/A;  . HAND RECONSTRUCTION Bilateral 1970s   "tore up by garage door spring"  . INGUINAL HERNIA REPAIR Bilateral    Social History  Substance Use Topics  . Smoking status: Current Some Day Smoker    Packs/day: 0.50    Years: 65.00    Types: Cigarettes  . Smokeless tobacco: Never  Used  . Alcohol use 0.0 oz/week     Comment: 11/03/2015 "not drinking anything now"   family history includes CAD in his father; Diabetes in his brother and sister.  ROS as above:  Medications: Current Outpatient Prescriptions  Medication Sig Dispense Refill  . aspirin EC 81 MG tablet Take 1 tablet (81 mg total) by mouth daily.    Marland Kitchen atorvastatin (LIPITOR) 80 MG tablet Take 1 tablet (80 mg total) by mouth daily. 90 tablet 3  . azithromycin (ZITHROMAX) 250 MG tablet Take 1 tablet (250 mg total) by mouth daily. Take first 2 tablets together, then 1 every day until finished. 6 tablet 0  . Cetirizine HCl 10 MG CAPS Take 10 mg by mouth daily.    Marland Kitchen doxazosin (CARDURA) 4 MG tablet TAKE ONE TABLET BY MOUTH ONCE DAILY 90 tablet 2  . furosemide (LASIX) 40 MG tablet TAKE ONE TABLET BY MOUTH ONCE DAILY 30 tablet 3  . ipratropium (ATROVENT) 0.06 % nasal spray Place 2 sprays into both nostrils every 4 (four) hours as needed for rhinitis. 10 mL 6  . KLOR-CON M20 20 MEQ tablet TAKE ONE TABLET BY MOUTH TWICE DAILY 60 tablet 5  . lisinopril (PRINIVIL,ZESTRIL) 2.5 MG tablet Take 1 tablet (2.5 mg total) by mouth daily. 30 tablet 12  . LORazepam (ATIVAN) 0.5 MG tablet TAKE ONE TABLET BY MOUTH EVERY 8 HOURS AS NEEDED  FOR ANXIETY 30 tablet 5  . metoprolol succinate (TOPROL-XL) 50 MG 24 hr tablet TAKE ONE TABLET BY MOUTH TWICE DAILY 60 tablet 7  . Multiple Vitamin (MULTIVITAMIN) tablet Take 1 tablet by mouth daily.    . pantoprazole (PROTONIX) 40 MG tablet Take 1 tablet (40 mg total) by mouth 2 (two) times daily. 180 tablet 2  . PROAIR HFA 108 (90 Base) MCG/ACT inhaler INHALE TWO PUFFS INTO THE LUNGS EVERY 6 HOURS AS NEEDED FOR WHEEZING OR SHORTNESS OF BREATH 9 each 0  . PROAIR HFA 108 (90 Base) MCG/ACT inhaler INHALE TWO PUFFS INTO LUNGS EVERY 6 HOURS AS NEEDED FOR WHEEZING OR SHORTNESS OF BREATH 9 each 0  . sucralfate (CARAFATE) 1 g tablet Take 1 tablet (1 g total) by mouth 4 (four) times daily. 120 tablet 3  .  tamsulosin (FLOMAX) 0.4 MG CAPS capsule Take 1 capsule (0.4 mg total) by mouth daily. 90 capsule 3   No current facility-administered medications for this visit.    No Known Allergies  Health Maintenance Health Maintenance  Topic Date Due  . TETANUS/TDAP  02/08/1952  . PNA vac Low Risk Adult (2 of 2 - PCV13) 05/23/2016  . INFLUENZA VACCINE  Completed     Exam:  BP (!) 158/64   Pulse 80   Temp 97.7 F (36.5 C) (Oral)   Wt 153 lb (69.4 kg)   BMI 22.59 kg/m  Gen: Well NAD HEENT: clear drainage noted in back of throat, left TM bulging slightly Lungs: Normal work of breathing. CTABL Heart: RRR no MRG    No results found for this or any previous visit (from the past 72 hour(s)). No results found.    Assessment and Plan: 83 y.o. male with 1 day history of sore throat, right ear pain without fever, cough.   Most likely viral sinusitis with his presentation. Patient was instructed on saline nasal spray and rest.   Ipratropium for rhinitis and azithromycin as a backup antibiotic if symptoms worsens.     No orders of the defined types were placed in this encounter.  Meds ordered this encounter  Medications  . ipratropium (ATROVENT) 0.06 % nasal spray    Sig: Place 2 sprays into both nostrils every 4 (four) hours as needed for rhinitis.    Dispense:  10 mL    Refill:  6  . azithromycin (ZITHROMAX) 250 MG tablet    Sig: Take 1 tablet (250 mg total) by mouth daily. Take first 2 tablets together, then 1 every day until finished.    Dispense:  6 tablet    Refill:  0     Discussed warning signs or symptoms. Please see discharge instructions. Patient expresses understanding.

## 2016-10-15 NOTE — Patient Instructions (Signed)
Thank you for coming in today. Use the nasal spray.  Use the atrovent nasal spray as needed.   Use the backup antibiotic if worsening or not better.  Call or go to the emergency room if you get worse, have trouble breathing, have chest pains, or palpitations.    Sinusitis, Adult Sinusitis is soreness and inflammation of your sinuses. Sinuses are hollow spaces in the bones around your face. They are located:  Around your eyes.  In the middle of your forehead.  Behind your nose.  In your cheekbones. Your sinuses and nasal passages are lined with a stringy fluid (mucus). Mucus normally drains out of your sinuses. When your nasal tissues get inflamed or swollen, the mucus can get trapped or blocked so air cannot flow through your sinuses. This lets bacteria, viruses, and funguses grow, and that leads to infection. Follow these instructions at home: Medicines   Take, use, or apply over-the-counter and prescription medicines only as told by your doctor. These may include nasal sprays.  If you were prescribed an antibiotic medicine, take it as told by your doctor. Do not stop taking the antibiotic even if you start to feel better. Hydrate and Humidify   Drink enough water to keep your pee (urine) clear or pale yellow.  Use a cool mist humidifier to keep the humidity level in your home above 50%.  Breathe in steam for 10-15 minutes, 3-4 times a day or as told by your doctor. You can do this in the bathroom while a hot shower is running.  Try not to spend time in cool or dry air. Rest   Rest as much as possible.  Sleep with your head raised (elevated).  Make sure to get enough sleep each night. General instructions   Put a warm, moist washcloth on your face 3-4 times a day or as told by your doctor. This will help with discomfort.  Wash your hands often with soap and water. If there is no soap and water, use hand sanitizer.  Do not smoke. Avoid being around people who are  smoking (secondhand smoke).  Keep all follow-up visits as told by your doctor. This is important. Contact a doctor if:  You have a fever.  Your symptoms get worse.  Your symptoms do not get better within 10 days. Get help right away if:  You have a very bad headache.  You cannot stop throwing up (vomiting).  You have pain or swelling around your face or eyes.  You have trouble seeing.  You feel confused.  Your neck is stiff.  You have trouble breathing. This information is not intended to replace advice given to you by your health care provider. Make sure you discuss any questions you have with your health care provider. Document Released: 12/26/2007 Document Revised: 03/04/2016 Document Reviewed: 05/04/2015 Elsevier Interactive Patient Education  2017 [employer].

## 2016-10-17 DIAGNOSIS — C44319 Basal cell carcinoma of skin of other parts of face: Secondary | ICD-10-CM | POA: Diagnosis not present

## 2016-10-23 ENCOUNTER — Other Ambulatory Visit: Payer: Self-pay | Admitting: Cardiology

## 2016-10-23 ENCOUNTER — Other Ambulatory Visit: Payer: Self-pay | Admitting: Family Medicine

## 2016-10-23 DIAGNOSIS — N410 Acute prostatitis: Secondary | ICD-10-CM

## 2016-10-23 DIAGNOSIS — I2510 Atherosclerotic heart disease of native coronary artery without angina pectoris: Secondary | ICD-10-CM

## 2016-10-23 NOTE — Telephone Encounter (Signed)
Rx request sent to pharmacy.  

## 2016-10-31 DIAGNOSIS — C44319 Basal cell carcinoma of skin of other parts of face: Secondary | ICD-10-CM | POA: Diagnosis not present

## 2016-11-06 DIAGNOSIS — Z08 Encounter for follow-up examination after completed treatment for malignant neoplasm: Secondary | ICD-10-CM | POA: Diagnosis not present

## 2016-11-06 DIAGNOSIS — L570 Actinic keratosis: Secondary | ICD-10-CM | POA: Diagnosis not present

## 2016-11-06 DIAGNOSIS — Z85828 Personal history of other malignant neoplasm of skin: Secondary | ICD-10-CM | POA: Diagnosis not present

## 2016-11-15 ENCOUNTER — Other Ambulatory Visit: Payer: Self-pay | Admitting: Family Medicine

## 2016-12-24 ENCOUNTER — Other Ambulatory Visit: Payer: Self-pay | Admitting: Cardiology

## 2016-12-24 ENCOUNTER — Other Ambulatory Visit: Payer: Self-pay | Admitting: Family Medicine

## 2016-12-24 DIAGNOSIS — I25810 Atherosclerosis of coronary artery bypass graft(s) without angina pectoris: Secondary | ICD-10-CM

## 2016-12-24 NOTE — Telephone Encounter (Signed)
Rx request sent to pharmacy.  

## 2017-02-18 ENCOUNTER — Other Ambulatory Visit: Payer: Self-pay

## 2017-02-19 ENCOUNTER — Other Ambulatory Visit: Payer: Self-pay | Admitting: Family Medicine

## 2017-03-04 ENCOUNTER — Other Ambulatory Visit: Payer: Self-pay | Admitting: Family Medicine

## 2017-03-07 ENCOUNTER — Other Ambulatory Visit: Payer: Self-pay | Admitting: Family Medicine

## 2017-03-11 ENCOUNTER — Ambulatory Visit (INDEPENDENT_AMBULATORY_CARE_PROVIDER_SITE_OTHER): Payer: Medicare Other | Admitting: Family Medicine

## 2017-03-11 ENCOUNTER — Encounter: Payer: Self-pay | Admitting: Family Medicine

## 2017-03-11 VITALS — BP 114/60 | HR 102 | Wt 153.0 lb

## 2017-03-11 DIAGNOSIS — M25562 Pain in left knee: Secondary | ICD-10-CM

## 2017-03-11 DIAGNOSIS — Z23 Encounter for immunization: Secondary | ICD-10-CM

## 2017-03-11 DIAGNOSIS — I25810 Atherosclerosis of coronary artery bypass graft(s) without angina pectoris: Secondary | ICD-10-CM

## 2017-03-11 DIAGNOSIS — N411 Chronic prostatitis: Secondary | ICD-10-CM | POA: Diagnosis not present

## 2017-03-11 MED ORDER — DICLOFENAC SODIUM 1 % TD GEL: 4.0000 g | Freq: Four times a day (QID) | TRANSDERMAL | 11 refills | Status: DC

## 2017-03-11 MED ORDER — CIPROFLOXACIN HCL 500 MG PO TABS: 500.0000 mg | ORAL_TABLET | Freq: Two times a day (BID) | ORAL | 0 refills | Status: DC

## 2017-03-11 NOTE — Patient Instructions (Addendum)
Thank you for coming in today. Call or go to the ER if you develop a large red swollen joint with extreme pain or oozing puss.  Use the voltaren gel for pain as needed up to 4x daily.  Take cipro twice daily for prostate for 2 weeks.   Return for wellness exam in 1-2 months

## 2017-03-11 NOTE — Progress Notes (Signed)
Bradley Stanley is a 83 y.o. male who presents to [place] today for follow-up of knee pain.  Patient reports aggrevation of chronic left knee pain. He attended a PGA tournament this weekend where he did more than his usual level of activity, including climbing several flights of stairs. He denies any trauma or twisting of the knee. Patient describes the pain as achy and feels that the source of the pain is deep within his knee. He has previously been diagnosed with degenerative joint disease and is concerned that this may be a flare-up. He iced the knee this weekend and found this to be somewhat helpful. Patient has previously received a steroid injection with good relief for at least 6 months. He reports that he has previously been to a [employer], fast food where he received electronic stimulation to the knee and UV light therapy which also helped relieve his pain.  Patient also reports increased urinary frequency and some dysuria. He states that this feels very similar to when he previously had prostatitis. He denies any abdominal pain fevers chills nausea vomiting diarrhea.   Past Medical History:  Diagnosis Date  . A-fib ([place]) 04/2015   after CABG.  Abixiban initiated.   . Acute blood loss anemia   . Anxiety    "occasionally" (11/03/2015)  . BPH (benign prostatic hyperplasia)   . CAD (coronary artery disease)   . Chronic bronchitis ([place])   . GERD (gastroesophageal reflux disease)   . Heart murmur   . High cholesterol   . Hypertension   . Myocardial infarction ([place]) 04/2015  . Pneumonia 05/2015   "after heart surgery"  . Pyloric ulcer   . Renal insufficiency    Past Surgical History:  Procedure Laterality Date  . CATARACT EXTRACTION W/ INTRAOCULAR LENS  IMPLANT, BILATERAL    . CORONARY ARTERY BYPASS GRAFT  04/2015   CABG "X4; [place]"  . ESOPHAGOGASTRODUODENOSCOPY N/A 11/06/2015   Procedure: ESOPHAGOGASTRODUODENOSCOPY (EGD);  Surgeon: Jerene Bears, MD;  Location: [facility] ENDOSCOPY;  Service: Endoscopy;  Laterality: N/A;  . HAND RECONSTRUCTION Bilateral 1970s   "tore up by garage door spring"  . INGUINAL HERNIA REPAIR Bilateral    Social History  Substance Use Topics  . Smoking status: Current Some Day Smoker    Packs/day: 0.50    Years: 65.00    Types: Cigarettes  . Smokeless tobacco: Never Used  . Alcohol use 0.0 oz/week     Comment: 11/03/2015 "not drinking anything now"     ROS:  As above   Medications: Current Outpatient Prescriptions  Medication Sig Dispense Refill  . albuterol (PROAIR HFA) 108 (90 Base) MCG/ACT inhaler Inhale 2 puffs into the lungs every 6 (six) hours as needed for wheezing or shortness of breath. 3 Inhaler 3  . aspirin EC 81 MG tablet Take 1 tablet (81 mg total) by mouth daily.    Marland Kitchen atorvastatin (LIPITOR) 80 MG tablet TAKE ONE TABLET BY MOUTH ONCE DAILY 90 tablet 3  . Cetirizine HCl 10 MG CAPS Take 10 mg by mouth daily.    Marland Kitchen doxazosin (CARDURA) 4 MG tablet TAKE ONE TABLET BY MOUTH ONCE DAILY 90 tablet 2  . furosemide (LASIX) 40 MG tablet Take 1 tablet (40 mg total) by mouth daily. Due for follow up visit 30 tablet 0  . ipratropium (ATROVENT) 0.06 % nasal spray Place 2 sprays into both nostrils every 4 (four) hours as needed for rhinitis. 10 mL 6  . KLOR-CON M20 20 MEQ  tablet TAKE 1 TABLET BY MOUTH TWICE DAILY 60 tablet 5  . lisinopril (PRINIVIL,ZESTRIL) 2.5 MG tablet TAKE ONE TABLET BY MOUTH ONCE DAILY 60 tablet 6  . LORazepam (ATIVAN) 0.5 MG tablet TAKE ONE TABLET BY MOUTH EVERY 8 HOURS AS NEEDED FOR ANXIETY 30 tablet 5  . metoprolol succinate (TOPROL-XL) 50 MG 24 hr tablet TAKE ONE TABLET BY MOUTH TWICE DAILY 60 tablet 7  . Multiple Vitamin (MULTIVITAMIN) tablet Take 1 tablet by mouth daily.    . pantoprazole (PROTONIX) 40 MG tablet Take 1 tablet (40 mg total) by mouth 2 (two) times daily. 180 tablet 2  . sucralfate (CARAFATE) 1 g tablet TAKE ONE TABLET BY MOUTH 4 TIMES DAILY 120 tablet 0  .  tamsulosin (FLOMAX) 0.4 MG CAPS capsule TAKE ONE CAPSULE BY MOUTH ONCE DAILY 90 capsule 3  . ciprofloxacin (CIPRO) 500 MG tablet Take 1 tablet (500 mg total) by mouth 2 (two) times daily. 28 tablet 0  . diclofenac sodium (VOLTAREN) 1 % GEL Apply 4 g topically 4 (four) times daily. To affected joint. 100 g 11   No current facility-administered medications for this visit.    No Known Allergies   Exam:  BP 114/60   Pulse (!) 102   Wt 153 lb (69.4 kg)   BMI 22.59 kg/m  General: Well Developed, well nourished, and in no acute distress.  Neuro/Psych: Alert and oriented x3, extra-ocular muscles intact, able to move all 4 extremities, sensation grossly intact. Skin: Warm and dry, no rashes noted.  Respiratory: Not using accessory muscles, speaking in full sentences, trachea midline.  Cardiovascular: Pulses palpable, no extremity edema. Abdomen: Does not appear distended.Nontender to palpation. MSK: Left knee: No gross deformity or edema on inspection No tenderness to palpation Range of motion is 10-120 degrees, patient is able to fully extended knee with assistance, this elicits pain Strength is 5/5 with flexion and extension Lachmans negative, posterior drawer negative, McMurray test negative  Procedure: Real-time Ultrasound Guided Injection of Left Knee  Device: GE Logiq E  Images permanently stored and available for review in the ultrasound unit. Verbal informed consent obtained. Discussed risks and benefits of procedure. Warned about infection bleeding damage to structures skin hypopigmentation and fat atrophy among others. Patient expresses understanding and agreement Time-out conducted.  Noted no overlying erythema, induration, or other signs of local infection.  Skin prepped in a sterile fashion.  Local anesthesia: Topical Ethyl chloride.  With sterile technique and under real time ultrasound guidance: 80mg  kenalog and 65ml marcaine injected easily.  Completed without  difficulty  Pain immediately resolved suggesting accurate placement of the medication.  Advised to call if fevers/chills, erythema, induration, drainage, or persistent bleeding.  Images permanently stored and available for review in the ultrasound unit.  Impression: Technically successful ultrasound guided injection.   Study Result   CLINICAL DATA:  Left knee pain for the past week after being on the knee is while gardening ; right knee series for comparison.  EXAM: RIGHT KNEE - 1-2 VIEW; LEFT KNEE - COMPLETE 4+ VIEW  COMPARISON:  None in PACs  FINDINGS: The bones are adequately mineralized. There is no acute or healing fracture. There is narrowing of the medial joint compartments bilaterally slightly greater on the left than on the right. There is beaking of the tibial spines. There arm vascular clips in the soft tissues over the medial aspect of the right knee. There is no joint effusion or chondrocalcinosis. There spurs arising from the superior and lateral margins of  the left patella.  IMPRESSION: Moderate ostial arthritic joint space loss of the medial compartments bilaterally greater on the left than on the right. Mild degenerative change of the left patellofemoral joint. There is no acute bony abnormality.   Electronically Signed   By: David  Martinique M.D.   On: 10/31/2015 13:59        Assessment and Plan: 83 y.o. male presenting for follow-up of knee pain. Given patient's history of knee pain and recent increase in activity, it is very likely that he is experiencing a flare-up of his DJD. It is reasonable to give a steroid injection at this time because this is a chronic issue that has previously responded well to this treatment. Patient was also given a prescription for Voltaren gel.  For patient's dysuria and increased urinary frequency, he was given a prescription for ciprofloxacin. Patient previously tolerated this medication well and this appears to be  another episode of prostatitis.   Patient will receive Pneumococcal conjugate 13-valent vaccination today in clinic. He will be scheduled to follow-up in 1-2 months for a well visit.   Orders Placed This Encounter  Procedures  . Pneumococcal conjugate vaccine 13-valent   Meds ordered this encounter  Medications  . diclofenac sodium (VOLTAREN) 1 % GEL    Sig: Apply 4 g topically 4 (four) times daily. To affected joint.    Dispense:  100 g    Refill:  11  . ciprofloxacin (CIPRO) 500 MG tablet    Sig: Take 1 tablet (500 mg total) by mouth 2 (two) times daily.    Dispense:  28 tablet    Refill:  0    Discussed warning signs or symptoms. Please see discharge instructions. Patient expresses understanding.

## 2017-03-15 ENCOUNTER — Telehealth: Payer: Self-pay

## 2017-03-15 NOTE — Telephone Encounter (Signed)
Pre Authorization was sent to Cover My Meds and was approved.(Key: [id number]) Called pharmacy and verified. LVM letting the pt know that his prescription was ready for pickup.

## 2017-03-28 ENCOUNTER — Other Ambulatory Visit: Payer: Self-pay | Admitting: Family Medicine

## 2017-04-03 ENCOUNTER — Other Ambulatory Visit: Payer: Self-pay | Admitting: Family Medicine

## 2017-04-18 ENCOUNTER — Other Ambulatory Visit: Payer: Self-pay | Admitting: Family Medicine

## 2017-04-18 DIAGNOSIS — N410 Acute prostatitis: Secondary | ICD-10-CM

## 2017-04-26 ENCOUNTER — Other Ambulatory Visit: Payer: Self-pay | Admitting: Family Medicine

## 2017-04-28 ENCOUNTER — Other Ambulatory Visit: Payer: Self-pay | Admitting: Family Medicine

## 2017-05-07 DIAGNOSIS — L570 Actinic keratosis: Secondary | ICD-10-CM | POA: Diagnosis not present

## 2017-05-07 DIAGNOSIS — Z85828 Personal history of other malignant neoplasm of skin: Secondary | ICD-10-CM | POA: Diagnosis not present

## 2017-05-07 DIAGNOSIS — Z08 Encounter for follow-up examination after completed treatment for malignant neoplasm: Secondary | ICD-10-CM | POA: Diagnosis not present

## 2017-05-13 ENCOUNTER — Encounter: Payer: Medicare Other | Admitting: Family Medicine

## 2017-05-21 ENCOUNTER — Encounter: Payer: Self-pay | Admitting: Family Medicine

## 2017-05-21 ENCOUNTER — Ambulatory Visit (INDEPENDENT_AMBULATORY_CARE_PROVIDER_SITE_OTHER): Payer: Medicare Other | Admitting: Family Medicine

## 2017-05-21 VITALS — BP 151/68 | HR 88 | Temp 97.4°F | Ht 69.0 in | Wt 152.0 lb

## 2017-05-21 DIAGNOSIS — E785 Hyperlipidemia, unspecified: Secondary | ICD-10-CM | POA: Diagnosis not present

## 2017-05-21 DIAGNOSIS — I10 Essential (primary) hypertension: Secondary | ICD-10-CM

## 2017-05-21 DIAGNOSIS — Z125 Encounter for screening for malignant neoplasm of prostate: Secondary | ICD-10-CM

## 2017-05-21 DIAGNOSIS — J3489 Other specified disorders of nose and nasal sinuses: Secondary | ICD-10-CM

## 2017-05-21 DIAGNOSIS — N183 Chronic kidney disease, stage 3 unspecified: Secondary | ICD-10-CM

## 2017-05-21 DIAGNOSIS — N410 Acute prostatitis: Secondary | ICD-10-CM

## 2017-05-21 DIAGNOSIS — H9313 Tinnitus, bilateral: Secondary | ICD-10-CM | POA: Diagnosis not present

## 2017-05-21 DIAGNOSIS — K259 Gastric ulcer, unspecified as acute or chronic, without hemorrhage or perforation: Secondary | ICD-10-CM | POA: Diagnosis not present

## 2017-05-21 DIAGNOSIS — Z0000 Encounter for general adult medical examination without abnormal findings: Secondary | ICD-10-CM

## 2017-05-21 DIAGNOSIS — Z23 Encounter for immunization: Secondary | ICD-10-CM

## 2017-05-21 DIAGNOSIS — E43 Unspecified severe protein-calorie malnutrition: Secondary | ICD-10-CM

## 2017-05-21 MED ORDER — DOXAZOSIN MESYLATE 4 MG PO TABS: 4.0000 mg | ORAL_TABLET | Freq: Every day | ORAL | 1 refills | Status: DC

## 2017-05-21 MED ORDER — AZELASTINE HCL 0.1 % NA SOLN: 2.0000 | Freq: Two times a day (BID) | NASAL | 12 refills | Status: DC

## 2017-05-21 MED ORDER — FUROSEMIDE 40 MG PO TABS: 40.0000 mg | ORAL_TABLET | Freq: Every day | ORAL | 1 refills | Status: DC

## 2017-05-21 MED ORDER — AZITHROMYCIN 250 MG PO TABS: 250.0000 mg | ORAL_TABLET | Freq: Every day | ORAL | 0 refills | Status: DC

## 2017-05-21 NOTE — Progress Notes (Signed)
HPI: Bradley Stanley is a 83 y.o. male  who presents to [place] today, 05/21/17,  for Medicare Annual Wellness Exam  Patient presents for annual physical/Medicare wellness exam. [name] notes some nasal discharge and congestion. Symptoms are somewhat consistent with sinusitis. He also notes some runny nose with eating food. In the past he's been tried on Atrovent nasal spray for vasomotor rhinitis which did not help.  Additionally he notes worsening tinnitus. This is been worsening the last few weeks and is quite obnoxious.   Past medical, surgical, social and family history reviewed:  Patient Active Problem List   Diagnosis Date Noted  . Left knee pain 01/31/2016  . Ischemic cardiomyopathy 12/07/2015  . Tobacco abuse 12/07/2015  . Chronic systolic congestive heart failure ([place]) 12/05/2015  . Wheezing 12/05/2015  . Prostatitis 11/16/2015  . Multiple gastric ulcers   . Pyloric stenosis, acquired   . CKD (chronic kidney disease) stage 3, GFR 30-59 ml/min (H[facility]) 11/04/2015  . Paroxysmal atrial fibrillation ([place]) 11/04/2015  . Dyslipidemia 11/04/2015  . Protein-calorie malnutrition, severe 11/04/2015  . Gastric outlet obstruction 11/04/2015  . Essential hypertension 10/19/2015  . Peripheral vascular disease ([place]) 10/19/2015    Past Surgical History:  Procedure Laterality Date  . CATARACT EXTRACTION W/ INTRAOCULAR LENS  IMPLANT, BILATERAL    . CORONARY ARTERY BYPASS GRAFT  04/2015   CABG "X4; [place]"  . ESOPHAGOGASTRODUODENOSCOPY N/A 11/06/2015   Procedure: ESOPHAGOGASTRODUODENOSCOPY (EGD);  Surgeon: Jerene Bears, MD;  Location: [facility] ENDOSCOPY;  Service: Endoscopy;  Laterality: N/A;  . HAND RECONSTRUCTION Bilateral 1970s   "tore up by garage door spring"  . INGUINAL HERNIA REPAIR Bilateral     Social History   Social History  . Marital status: Married    Spouse name: N/A  . Number of children: 3  . Years of education: N/A   Occupational  History  . Not on file.   Social History Main Topics  . Smoking status: Current Some Day Smoker    Packs/day: 0.50    Years: 65.00    Types: Cigarettes  . Smokeless tobacco: Never Used  . Alcohol use 0.0 oz/week     Comment: 11/03/2015 "not drinking anything now"  . Drug use: No  . Sexual activity: No   Other Topics Concern  . Not on file   Social History Narrative  . No narrative on file    Family History  Problem Relation Age of Onset  . CAD Father   . Diabetes Sister   . Diabetes Brother      Current medication list and allergy/intolerance information reviewed:    Outpatient Encounter Prescriptions as of 05/21/2017  Medication Sig  . albuterol (PROAIR HFA) 108 (90 Base) MCG/ACT inhaler Inhale 2 puffs into the lungs every 6 (six) hours as needed for wheezing or shortness of breath.  Marland Kitchen aspirin EC 81 MG tablet Take 1 tablet (81 mg total) by mouth daily.  Marland Kitchen atorvastatin (LIPITOR) 80 MG tablet TAKE ONE TABLET BY MOUTH ONCE DAILY  . Cetirizine HCl 10 MG CAPS Take 10 mg by mouth daily.  . diclofenac sodium (VOLTAREN) 1 % GEL Apply 4 g topically 4 (four) times daily. To affected joint.  Marland Kitchen doxazosin (CARDURA) 4 MG tablet Take 1 tablet (4 mg total) by mouth daily.  . furosemide (LASIX) 40 MG tablet Take 1 tablet (40 mg total) by mouth daily.  Marland Kitchen ipratropium (ATROVENT) 0.06 % nasal spray Place 2 sprays into both nostrils every 4 (four) hours  as needed for rhinitis.  Marland Kitchen KLOR-CON M20 20 MEQ tablet TAKE 1 TABLET BY MOUTH TWICE DAILY  . lisinopril (PRINIVIL,ZESTRIL) 2.5 MG tablet TAKE ONE TABLET BY MOUTH ONCE DAILY  . LORazepam (ATIVAN) 0.5 MG tablet TAKE ONE TABLET BY MOUTH EVERY 8 HOURS AS NEEDED FOR ANXIETY  . metoprolol succinate (TOPROL-XL) 50 MG 24 hr tablet TAKE ONE TABLET BY MOUTH TWICE DAILY  . Multiple Vitamin (MULTIVITAMIN) tablet Take 1 tablet by mouth daily.  . pantoprazole (PROTONIX) 40 MG tablet TAKE ONE TABLET BY MOUTH TWICE DAILY  . sucralfate (CARAFATE) 1 g tablet  TAKE 1 TABLET BY MOUTH 4 TIMES DAILY  . tamsulosin (FLOMAX) 0.4 MG CAPS capsule TAKE ONE CAPSULE BY MOUTH ONCE DAILY  . [DISCONTINUED] ciprofloxacin (CIPRO) 500 MG tablet Take 1 tablet (500 mg total) by mouth 2 (two) times daily.  . [DISCONTINUED] doxazosin (CARDURA) 4 MG tablet TAKE ONE TABLET BY MOUTH ONCE DAILY  . [DISCONTINUED] furosemide (LASIX) 40 MG tablet TAKE 1 TABLET BY MOUTH ONCE DAILY  . azelastine (ASTELIN) 0.1 % nasal spray Place 2 sprays into both nostrils 2 (two) times daily. Use in each nostril as directed  . azithromycin (ZITHROMAX) 250 MG tablet Take 1 tablet (250 mg total) by mouth daily. Take first 2 tablets together, then 1 every day until finished.   No facility-administered encounter medications on file as of 05/21/2017.     No Known Allergies     Review of Systems: No headache, visual changes, nausea, vomiting, diarrhea, constipation, dizziness, abdominal pain, skin rash, fevers, chills, night sweats, weight loss, swollen lymph nodes, body aches, joint swelling, muscle aches, chest pain, shortness of breath, mood changes, visual or auditory hallucinations.     Medicare Wellness Questionnaire  Are there smokers in your home (other than you)? no  Depression screen PHQ 2/9 03/12/2017  Decreased Interest 0  Down, Depressed, Hopeless 0  PHQ - 2 Score 0        Activities of Daily Living In your present state of health, do you have any difficulty performing the following activities?:  Driving? no Managing money?  no Feeding yourself? no Getting from bed to chair? no Climbing a flight of stairs? no Preparing food and eating?: no Bathing or showering? no Getting dressed: no Getting to the toilet? no Using the toilet: no Moving around from place to place: no In the past year have you fallen or had a near fall?: no  Hearing Difficulties:  Do you often ask people to speak up or repeat themselves? yes Do you experience ringing or noises in your ears?  yes  Do you have difficulty understanding soft or whispered voices? yes  Memory Difficulties:  Do you feel that you have a problem with memory? no  Do you often misplace items? no  Do you feel safe at home?  yes  Sexual Health:   Are you sexually active?  No  Do you have more than one partner?  No   Risk Factors  Current exercise habits: Walking  Dietary issues discussed:Yes  Cardiac risk factors: Present   Exam:  BP (!) 151/68   Pulse 88   Temp (!) 97.4 F (36.3 C) (Oral)   Ht 5\' 9"  (1.753 m)   Wt 152 lb (68.9 kg)   SpO2 98%   BMI 22.45 kg/m  Vision by Snellen chart: right eye:see nurse notes, left eye:see nurse notes  Constitutional: VS see above. General Appearance: alert, well-developed, well-nourished, NAD  Ears, Nose, Mouth, Throat: MMM  Neck: No masses, trachea midline.   Respiratory: Normal respiratory effort. no wheeze, no rhonchi, no rales  Cardiovascular:No lower extremity edema.   Musculoskeletal: Gait normal. No clubbing/cyanosis of digits.   Neurological: Normal balance/coordination. No tremor. Recalls 3 objects and able to read face of watch with correct time.   Skin: warm, dry, intact. No rash/ulcer.   Psychiatric: Normal judgment/insight. Normal mood and affect. Oriented x3.     ASSESSMENT/PLAN:   Encounter for Medicare annual wellness exam  -- Sinusitis: Versus vasomotor rhinitis.  Trial of treatment with Astelin if not better pretty prescription for azithromycin ordered.  --Tinnitus: Refer to audiology  Tinnitus of both ears - Plan: Ambulatory referral to Audiology, CBC, COMPLETE METABOLIC PANEL WITH GFR, Lipid Panel w/reflex Direct LDL  Encounter for immunization - Plan: Flu vaccine HIGH DOSE PF, furosemide (LASIX) 40 MG tablet, doxazosin (CARDURA) 4 MG tablet  Acute prostatitis - Plan: doxazosin (CARDURA) 4 MG tablet, CBC, COMPLETE METABOLIC PANEL WITH GFR, Lipid Panel w/reflex Direct LDL, PSA  Nasal discharge - Plan: azelastine  (ASTELIN) 0.1 % nasal spray, CBC, COMPLETE METABOLIC PANEL WITH GFR, Lipid Panel w/reflex Direct LDL, azithromycin (ZITHROMAX) 250 MG tablet  CKD (chronic kidney disease) stage 3, GFR 30-59 ml/min (HCC) - Plan: CBC, COMPLETE METABOLIC PANEL WITH GFR, Lipid Panel w/reflex Direct LDL  Multiple gastric ulcers - Plan: CBC, COMPLETE METABOLIC PANEL WITH GFR, Lipid Panel w/reflex Direct LDL  Essential hypertension - Plan: CBC, COMPLETE METABOLIC PANEL WITH GFR, Lipid Panel w/reflex Direct LDL  Dyslipidemia - Plan: CBC, COMPLETE METABOLIC PANEL WITH GFR, Lipid Panel w/reflex Direct LDL  Protein-calorie malnutrition, severe - Plan: CBC, COMPLETE METABOLIC PANEL WITH GFR, Lipid Panel w/reflex Direct LDL  Screening for prostate cancer - Plan: PSA  Health Maintenance Health Maintenance  Topic Date Due  . TETANUS/TDAP  02/08/1952  . INFLUENZA VACCINE  02/20/2017  . PNA vac Low Risk Adult  Completed    Immunization History  Administered Date(s) Administered  . Influenza, High Dose Seasonal PF 05/21/2017  . Influenza,inj,Quad PF,6+ Mos 06/26/2016  . Pneumococcal Conjugate-13 03/11/2017  . Pneumococcal-Unspecified 05/24/2015     During the course of the visit the patient was educated and counseled about appropriate screening and preventive services as noted above.   Patient Instructions (the written plan) was given to the patient.  Medicare Attestation I have personally reviewed: The patient's medical and social history Their use of alcohol, tobacco or illicit drugs Their current medications and supplements The patient's functional ability including ADLs,fall risks, home safety risks, cognitive, and hearing and visual impairment Diet and physical activities Evidence for depression or mood disorders  The patient's weight, height, BMI, and visual acuity have been recorded in the chart.  I have made referrals, counseling, and provided education to the patient based on review of the above  and I have provided the patient with a written personalized care plan for preventive services.

## 2017-05-21 NOTE — Patient Instructions (Signed)
Thank you for coming in today. Get labs soon.  Try the nasal spray.  If not better take azithromycin.  Recheck in 6 months.  Return sooner if needed.

## 2017-05-31 DIAGNOSIS — Z125 Encounter for screening for malignant neoplasm of prostate: Secondary | ICD-10-CM | POA: Diagnosis not present

## 2017-05-31 DIAGNOSIS — I10 Essential (primary) hypertension: Secondary | ICD-10-CM | POA: Diagnosis not present

## 2017-05-31 DIAGNOSIS — K259 Gastric ulcer, unspecified as acute or chronic, without hemorrhage or perforation: Secondary | ICD-10-CM | POA: Diagnosis not present

## 2017-05-31 DIAGNOSIS — H9313 Tinnitus, bilateral: Secondary | ICD-10-CM | POA: Diagnosis not present

## 2017-05-31 DIAGNOSIS — N183 Chronic kidney disease, stage 3 (moderate): Secondary | ICD-10-CM | POA: Diagnosis not present

## 2017-05-31 DIAGNOSIS — E785 Hyperlipidemia, unspecified: Secondary | ICD-10-CM | POA: Diagnosis not present

## 2017-05-31 DIAGNOSIS — E43 Unspecified severe protein-calorie malnutrition: Secondary | ICD-10-CM | POA: Diagnosis not present

## 2017-05-31 DIAGNOSIS — N410 Acute prostatitis: Secondary | ICD-10-CM | POA: Diagnosis not present

## 2017-06-01 LAB — COMPLETE METABOLIC PANEL WITH GFR
AG RATIO: 1.7 (calc) (ref 1.0–2.5)
ALBUMIN MSPROF: 3.9 g/dL (ref 3.6–5.1)
ALT: 16 U/L (ref 9–46)
AST: 16 U/L (ref 10–35)
Alkaline phosphatase (APISO): 76 U/L (ref 40–115)
BUN/Creatinine Ratio: 10 (calc) (ref 6–22)
BUN: 14 mg/dL (ref 7–25)
CALCIUM: 8.8 mg/dL (ref 8.6–10.3)
CO2: 26 mmol/L (ref 20–32)
CREATININE: 1.39 mg/dL — AB (ref 0.70–1.11)
Chloride: 107 mmol/L (ref 98–110)
GFR, EST AFRICAN AMERICAN: 54 mL/min/{1.73_m2} — AB (ref >=60)
GFR, EST NON AFRICAN AMERICAN: 47 mL/min/{1.73_m2} — AB (ref >=60)
GLOBULIN: 2.3 g/dL (ref 1.9–3.7)
Glucose, Bld: 101 mg/dL — ABNORMAL HIGH (ref 65–99)
POTASSIUM: 4.5 mmol/L (ref 3.5–5.3)
SODIUM: 142 mmol/L (ref 135–146)
TOTAL PROTEIN: 6.2 g/dL (ref 6.1–8.1)
Total Bilirubin: 0.5 mg/dL (ref 0.2–1.2)

## 2017-06-01 LAB — CBC
HEMATOCRIT: 38.6 % (ref 38.5–50.0)
HEMOGLOBIN: 13.3 g/dL (ref 13.2–17.1)
MCH: 32.1 pg (ref 27.0–33.0)
MCHC: 34.5 g/dL (ref 32.0–36.0)
MCV: 93.2 fL (ref 80.0–100.0)
MPV: 10.5 fL (ref 7.5–12.5)
Platelets: 279 10*3/uL (ref 140–400)
RBC: 4.14 10*6/uL — AB (ref 4.20–5.80)
RDW: 12.3 % (ref 11.0–15.0)
WBC: 8.9 10*3/uL (ref 3.8–10.8)

## 2017-06-01 LAB — LIPID PANEL W/REFLEX DIRECT LDL
CHOL/HDL RATIO: 2.7 (calc) (ref <5.0)
Cholesterol: 112 mg/dL (ref <200)
HDL: 41 mg/dL (ref >40)
LDL Cholesterol (Calc): 55 mg/dL (calc)
NON-HDL CHOLESTEROL (CALC): 71 mg/dL (ref <130)
Triglycerides: 80 mg/dL (ref <150)

## 2017-06-01 LAB — PSA: PSA: 0.6 ng/mL (ref <=4.0)

## 2017-06-06 DIAGNOSIS — L570 Actinic keratosis: Secondary | ICD-10-CM | POA: Diagnosis not present

## 2017-06-11 DIAGNOSIS — H6983 Other specified disorders of Eustachian tube, bilateral: Secondary | ICD-10-CM | POA: Diagnosis not present

## 2017-06-11 DIAGNOSIS — H903 Sensorineural hearing loss, bilateral: Secondary | ICD-10-CM | POA: Diagnosis not present

## 2017-06-11 DIAGNOSIS — H838X3 Other specified diseases of inner ear, bilateral: Secondary | ICD-10-CM | POA: Diagnosis not present

## 2017-06-11 DIAGNOSIS — H9313 Tinnitus, bilateral: Secondary | ICD-10-CM | POA: Diagnosis not present

## 2017-06-27 ENCOUNTER — Other Ambulatory Visit: Payer: Self-pay | Admitting: Family Medicine

## 2017-06-27 ENCOUNTER — Other Ambulatory Visit: Payer: Self-pay | Admitting: Cardiology

## 2017-07-09 DIAGNOSIS — L570 Actinic keratosis: Secondary | ICD-10-CM | POA: Diagnosis not present

## 2017-07-28 ENCOUNTER — Other Ambulatory Visit: Payer: Self-pay | Admitting: Cardiology

## 2017-08-06 DIAGNOSIS — L570 Actinic keratosis: Secondary | ICD-10-CM | POA: Diagnosis not present

## 2017-08-06 DIAGNOSIS — Z08 Encounter for follow-up examination after completed treatment for malignant neoplasm: Secondary | ICD-10-CM | POA: Diagnosis not present

## 2017-08-06 DIAGNOSIS — Z85828 Personal history of other malignant neoplasm of skin: Secondary | ICD-10-CM | POA: Diagnosis not present

## 2017-08-26 ENCOUNTER — Other Ambulatory Visit: Payer: Self-pay | Admitting: Family Medicine

## 2017-08-26 ENCOUNTER — Other Ambulatory Visit: Payer: Self-pay | Admitting: Cardiology

## 2017-09-23 ENCOUNTER — Other Ambulatory Visit: Payer: Self-pay | Admitting: Cardiology

## 2017-10-08 NOTE — Progress Notes (Signed)
HPI: FU coronary artery disease. Patient had an anteroseptal myocardial infarction in October 5465 complicated by cardiogenic shock. Cared for in [place]. He underwent emergent coronary artery bypass graft for left main disease and an occluded LAD. He had grafting of his LAD and ramus intermedius. His right coronary artery was occluded with left collaterals and was not grafted. There was a circumflex that rose anomalously from the right coronary cusp and had what was described as modest disease and was not grafted. He did have postoperative atrial fibrillation. Echocardiogram May 2017 showed ejection fraction 35-40% with akinesis of the anterior, septal, apical and inferior apical walls. 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 Follow-up recommended in 5 years. Patient did have postoperative atrial fibrillation following his bypass surgery. He was having occasional palpitations at last office visit. Event monitor October 2017 showed sinus rhythm with PACs and PVCs. Since last seen, the patient has dyspnea with more extreme activities but not with routine activities. It is relieved with rest. It is not associated with chest pain. There is no orthopnea, PND or pedal edema. There is no syncope or palpitations. There is no exertional chest pain.   Current Outpatient Medications  Medication Sig Dispense Refill  . albuterol (PROAIR HFA) 108 (90 Base) MCG/ACT inhaler Inhale 2 puffs into the lungs every 6 (six) hours as needed for wheezing or shortness of breath. 3 Inhaler 3  . aspirin EC 81 MG tablet Take 1 tablet (81 mg total) by mouth daily.    Marland Kitchen atorvastatin (LIPITOR) 80 MG tablet TAKE ONE TABLET BY MOUTH ONCE DAILY 90 tablet 3  . Cetirizine HCl 10 MG CAPS Take 10 mg by mouth daily.    . diclofenac sodium (VOLTAREN) 1 % GEL Apply 4 g topically 4 (four) times daily. To affected joint. 100 g 11  . doxazosin (CARDURA) 4 MG tablet Take 1 tablet (4 mg total) by mouth daily. 90  tablet 1  . furosemide (LASIX) 40 MG tablet Take 1 tablet (40 mg total) by mouth daily. 90 tablet 1  . ipratropium (ATROVENT) 0.06 % nasal spray Place 2 sprays into both nostrils every 4 (four) hours as needed for rhinitis. 10 mL 6  . KLOR-CON M20 20 MEQ tablet TAKE 1 TABLET BY MOUTH TWICE DAILY 60 tablet 5  . lisinopril (PRINIVIL,ZESTRIL) 2.5 MG tablet TAKE ONE TABLET BY MOUTH ONCE DAILY 60 tablet 6  . LORazepam (ATIVAN) 0.5 MG tablet TAKE ONE TABLET BY MOUTH EVERY 8 HOURS AS NEEDED FOR ANXIETY 30 tablet 5  . metoprolol succinate (TOPROL-XL) 50 MG 24 hr tablet TAKE 1 TABLET BY MOUTH TWICE DAILY **  NEED  APPOINTMENT  ** 60 tablet 0  . Multiple Vitamin (MULTIVITAMIN) tablet Take 1 tablet by mouth daily.    . pantoprazole (PROTONIX) 40 MG tablet TAKE ONE TABLET BY MOUTH TWICE DAILY 180 tablet 2  . sucralfate (CARAFATE) 1 g tablet TAKE 1 TABLET BY MOUTH 4 TIMES DAILY 360 tablet 1  . tamsulosin (FLOMAX) 0.4 MG CAPS capsule TAKE 1 CAPSULE BY MOUTH ONCE DAILY 90 capsule 3   No current facility-administered medications for this visit.      Past Medical History:  Diagnosis Date  . A-fib ([place]) 04/2015   after CABG.  Abixiban initiated.   . Acute blood loss anemia   . Anxiety    "occasionally" (11/03/2015)  . BPH (benign prostatic hyperplasia)   . CAD (coronary artery disease)   . Chronic bronchitis ([place])   .  GERD (gastroesophageal reflux disease)   . Heart murmur   . High cholesterol   . Hypertension   . Myocardial infarction ([place]) 04/2015  . Pneumonia 05/2015   "after heart surgery"  . Pyloric ulcer   . Renal insufficiency     Past Surgical History:  Procedure Laterality Date  . CATARACT EXTRACTION W/ INTRAOCULAR LENS  IMPLANT, BILATERAL    . CORONARY ARTERY BYPASS GRAFT  04/2015   CABG "X4; [place]"  . ESOPHAGOGASTRODUODENOSCOPY N/A 11/06/2015   Procedure: ESOPHAGOGASTRODUODENOSCOPY (EGD);  Surgeon: Jerene Bears, MD;  Location: [facility] ENDOSCOPY;  Service: Endoscopy;  Laterality:  N/A;  . HAND RECONSTRUCTION Bilateral 1970s   "tore up by garage door spring"  . INGUINAL HERNIA REPAIR Bilateral     Social History   Socioeconomic History  . Marital status: Married    Spouse name: Not on file  . Number of children: 3  . Years of education: Not on file  . Highest education level: Not on file  Occupational History  . Not on file  Social Needs  . Financial resource strain: Not on file  . Food insecurity:    Worry: Not on file    Inability: Not on file  . Transportation needs:    Medical: Not on file    Non-medical: Not on file  Tobacco Use  . Smoking status: Current Some Day Smoker    Packs/day: 0.50    Years: 65.00    Pack years: 32.50    Types: Cigarettes  . Smokeless tobacco: Never Used  Substance and Sexual Activity  . Alcohol use: Yes    Alcohol/week: 0.0 oz    Comment: 11/03/2015 "not drinking anything now"  . Drug use: No  . Sexual activity: Never  Lifestyle  . Physical activity:    Days per week: Not on file    Minutes per session: Not on file  . Stress: Not on file  Relationships  . Social connections:    Talks on phone: Not on file    Gets together: Not on file    Attends religious service: Not on file    Active member of club or organization: Not on file    Attends meetings of clubs or organizations: Not on file    Relationship status: Not on file  . Intimate partner violence:    Fear of current or ex partner: Not on file    Emotionally abused: Not on file    Physically abused: Not on file    Forced sexual activity: Not on file  Other Topics Concern  . Not on file  Social History Narrative  . Not on file    Family History  Problem Relation Age of Onset  . CAD Father   . Diabetes Sister   . Diabetes Brother     ROS: no fevers or chills, productive cough, hemoptysis, dysphasia, odynophagia, melena, hematochezia, dysuria, hematuria, rash, seizure activity, orthopnea, PND, pedal edema, claudication. Remaining systems are  negative.  Physical Exam: Well-developed well-nourished in no acute distress.  Skin is warm and dry.  HEENT is normal.  Neck is supple.  Chest is clear to auscultation with normal expansion.  Cardiovascular exam is regular rate and rhythm.  Abdominal exam nontender or distended. No masses palpated. Extremities show no edema. neuro grossly intact  ECG-sinus rhythm at a rate of 73.  Anterior lateral T wave inversion.  Personally reviewed  A/P  1 coronary artery disease status post coronary artery bypass and graft-plan to continue medical  therapy including aspirin and statin.  2 history of postoperative atrial fibrillation-no recurrences.  3 hypertension-blood pressure is controlled.  Continue present medications.  4 hyperlipidemia-continue statin.  5 peripheral vascular disease-previous ultrasound showed ectatic aorta.  Follow-up abdominal ultrasound May 2022.  6 ischemic cardiomyopathy-continue ACE inhibitor and beta-blocker.  7 chronic systolic congestive heart failure-patient is euvolemic.  Continue present dose of Lasix.  Patient instructed on low-sodium diet and fluid restriction.  8 tobacco abuse-patient counseled on discontinuing.  Kirk Ruths, MD

## 2017-10-14 ENCOUNTER — Other Ambulatory Visit: Payer: Self-pay | Admitting: Family Medicine

## 2017-10-14 DIAGNOSIS — N410 Acute prostatitis: Secondary | ICD-10-CM

## 2017-10-16 ENCOUNTER — Ambulatory Visit (INDEPENDENT_AMBULATORY_CARE_PROVIDER_SITE_OTHER): Payer: Medicare Other | Admitting: Cardiology

## 2017-10-16 ENCOUNTER — Encounter: Payer: Self-pay | Admitting: Cardiology

## 2017-10-16 VITALS — BP 118/70 | HR 73 | Ht 69.0 in | Wt 156.8 lb

## 2017-10-16 DIAGNOSIS — I25810 Atherosclerosis of coronary artery bypass graft(s) without angina pectoris: Secondary | ICD-10-CM | POA: Diagnosis not present

## 2017-10-16 DIAGNOSIS — E7800 Pure hypercholesterolemia, unspecified: Secondary | ICD-10-CM | POA: Diagnosis not present

## 2017-10-16 DIAGNOSIS — I480 Paroxysmal atrial fibrillation: Secondary | ICD-10-CM

## 2017-10-16 DIAGNOSIS — I10 Essential (primary) hypertension: Secondary | ICD-10-CM | POA: Diagnosis not present

## 2017-10-16 DIAGNOSIS — I2510 Atherosclerotic heart disease of native coronary artery without angina pectoris: Secondary | ICD-10-CM

## 2017-10-16 MED ORDER — ATORVASTATIN CALCIUM 80 MG PO TABS: 80.0000 mg | ORAL_TABLET | Freq: Every day | ORAL | 3 refills | Status: DC

## 2017-10-16 MED ORDER — METOPROLOL SUCCINATE ER 50 MG PO TB24: ORAL_TABLET | ORAL | 3 refills | Status: DC

## 2017-10-16 MED ORDER — LISINOPRIL 2.5 MG PO TABS: 2.5000 mg | ORAL_TABLET | Freq: Every day | ORAL | 3 refills | Status: DC

## 2017-10-16 NOTE — Patient Instructions (Signed)
Medication Instructions:  Your physician recommends that you continue on your current medications as directed. Please refer to the Current Medication list given to you today.  Labwork: -None  Testing/Procedures: -None  Follow-Up: Your physician wants you to follow-up in: 1 year with Dr. Stanford Breed. You will receive a reminder letter in the mail two months in advance. If you don't receive a letter, please call our office to schedule the follow-up appointment.   Any Other Special Instructions Will Be Listed Below (If Applicable).     If you need a refill on your cardiac medications before your next appointment, please call your pharmacy.

## 2017-11-05 DIAGNOSIS — L820 Inflamed seborrheic keratosis: Secondary | ICD-10-CM | POA: Diagnosis not present

## 2017-11-05 DIAGNOSIS — D485 Neoplasm of uncertain behavior of skin: Secondary | ICD-10-CM | POA: Diagnosis not present

## 2017-11-05 DIAGNOSIS — L570 Actinic keratosis: Secondary | ICD-10-CM | POA: Diagnosis not present

## 2017-11-05 DIAGNOSIS — L300 Nummular dermatitis: Secondary | ICD-10-CM | POA: Diagnosis not present

## 2017-11-19 ENCOUNTER — Ambulatory Visit (INDEPENDENT_AMBULATORY_CARE_PROVIDER_SITE_OTHER): Payer: Medicare Other | Admitting: Family Medicine

## 2017-11-19 ENCOUNTER — Encounter: Payer: Self-pay | Admitting: Family Medicine

## 2017-11-19 VITALS — BP 156/74 | HR 78 | Ht 69.0 in | Wt 153.0 lb

## 2017-11-19 DIAGNOSIS — G8929 Other chronic pain: Secondary | ICD-10-CM

## 2017-11-19 DIAGNOSIS — M25562 Pain in left knee: Secondary | ICD-10-CM | POA: Diagnosis not present

## 2017-11-19 DIAGNOSIS — I10 Essential (primary) hypertension: Secondary | ICD-10-CM | POA: Diagnosis not present

## 2017-11-19 DIAGNOSIS — E785 Hyperlipidemia, unspecified: Secondary | ICD-10-CM

## 2017-11-19 DIAGNOSIS — I739 Peripheral vascular disease, unspecified: Secondary | ICD-10-CM | POA: Diagnosis not present

## 2017-11-19 DIAGNOSIS — N183 Chronic kidney disease, stage 3 unspecified: Secondary | ICD-10-CM

## 2017-11-19 DIAGNOSIS — I480 Paroxysmal atrial fibrillation: Secondary | ICD-10-CM

## 2017-11-19 DIAGNOSIS — I25810 Atherosclerosis of coronary artery bypass graft(s) without angina pectoris: Secondary | ICD-10-CM | POA: Diagnosis not present

## 2017-11-19 DIAGNOSIS — I5022 Chronic systolic (congestive) heart failure: Secondary | ICD-10-CM

## 2017-11-19 MED ORDER — FUROSEMIDE 40 MG PO TABS: 40.0000 mg | ORAL_TABLET | Freq: Every day | ORAL | 1 refills | Status: DC

## 2017-11-19 MED ORDER — MONTELUKAST SODIUM 10 MG PO TABS: 10.0000 mg | ORAL_TABLET | Freq: Every day | ORAL | 3 refills | Status: DC

## 2017-11-19 MED ORDER — LISINOPRIL 10 MG PO TABS: 10.0000 mg | ORAL_TABLET | Freq: Every day | ORAL | 1 refills | Status: DC

## 2017-11-19 NOTE — Progress Notes (Signed)
Bradley Stanley is a 84 y.o. male who presents to [facility]: [facility] today for discuss allergies, knee pain, tinnitus, CVD risk.  Allergies.  Bradley Stanley has a history of seasonal allergies.  This is typically pretty well controlled with cetirizine and Flonase nasal spray.  He notes over the past few weeks has been worse than usual with the worse than usual allergy season.  He notes congestion and runny nose and itchy watery eyes.  He denies significant trouble breathing or severe cough.   Additionally Bradley Stanley has a history of left knee pain due to DJD.  He is done exceptionally well with diclofenac gel.  He is quite satisfied with his pain control..  Tinnitus: Bradley Stanley has a history of bilateral tinnitus.  He has been seen by 3 different audiologist.  Some of the audiologist recommended hearing aids that will cost $6000 some of them recommended against hearing aids at all.  He notes he does not really have much of a problem hearing but notes that the tinnitus is bothersome especially at bedtime.  He is not currently using a white noise machine.  CVD risk: Bradley Stanley has multiple cardiac/CVD risk factors including atrial fibrillation, coronary artery disease, history of myocardial infarction, hypertension.  He takes the medications listed below was seen by his cardiologist last month.  He is pretty satisfied with how things are going to denies chest pain palpitations or shortness of breath currently.  Chronic kidney disease: Bradley Stanley was diagnosed with stage III chronic kidney disease at the last check.  He takes lisinopril 2.5 mg daily.  He notes in the past he has had hypokalemia due to his Lasix.  He notes the potassium supplementation pill is very obnoxious to take and wonders if he can do something different.   Past Medical History:  Diagnosis Date  . A-fib ([place]) 04/2015   after CABG.  Abixiban initiated.    . Acute blood loss anemia   . Anxiety    "occasionally" (11/03/2015)  . BPH (benign prostatic hyperplasia)   . CAD (coronary artery disease)   . Chronic bronchitis ([place])   . GERD (gastroesophageal reflux disease)   . Heart murmur   . High cholesterol   . Hypertension   . Myocardial infarction ([place]) 04/2015  . Pneumonia 05/2015   "after heart surgery"  . Pyloric ulcer   . Renal insufficiency    Past Surgical History:  Procedure Laterality Date  . CATARACT EXTRACTION W/ INTRAOCULAR LENS  IMPLANT, BILATERAL    . CORONARY ARTERY BYPASS GRAFT  04/2015   CABG "X4; [place]"  . ESOPHAGOGASTRODUODENOSCOPY N/A 11/06/2015   Procedure: ESOPHAGOGASTRODUODENOSCOPY (EGD);  Surgeon: Jerene Bears, MD;  Location: [facility] ENDOSCOPY;  Service: Endoscopy;  Laterality: N/A;  . HAND RECONSTRUCTION Bilateral 1970s   "tore up by garage door spring"  . INGUINAL HERNIA REPAIR Bilateral    Social History   Tobacco Use  . Smoking status: Current Some Day Smoker    Packs/day: 0.50    Years: 65.00    Pack years: 32.50    Types: Cigarettes  . Smokeless tobacco: Never Used  Substance Use Topics  . Alcohol use: Yes    Alcohol/week: 0.0 oz    Comment: 11/03/2015 "not drinking anything now"   family history includes CAD in his father; Diabetes in his brother and sister.  ROS as above:  Medications: Current Outpatient Medications  Medication Sig Dispense Refill  . albuterol (PROAIR HFA) 108 (90 Base)  MCG/ACT inhaler Inhale 2 puffs into the lungs every 6 (six) hours as needed for wheezing or shortness of breath. 3 Inhaler 3  . aspirin EC 81 MG tablet Take 1 tablet (81 mg total) by mouth daily.    Marland Kitchen atorvastatin (LIPITOR) 80 MG tablet Take 1 tablet (80 mg total) by mouth daily. 90 tablet 3  . Cetirizine HCl 10 MG CAPS Take 10 mg by mouth daily.    . diclofenac sodium (VOLTAREN) 1 % GEL Apply 4 g topically 4 (four) times daily. To affected joint. 100 g 11  . doxazosin (CARDURA) 4 MG tablet Take 1 tablet  (4 mg total) by mouth daily. 90 tablet 1  . furosemide (LASIX) 40 MG tablet Take 1 tablet (40 mg total) by mouth daily. 90 tablet 1  . ipratropium (ATROVENT) 0.06 % nasal spray Place 2 sprays into both nostrils every 4 (four) hours as needed for rhinitis. 10 mL 6  . lisinopril (PRINIVIL,ZESTRIL) 10 MG tablet Take 1 tablet (10 mg total) by mouth daily. 90 tablet 1  . LORazepam (ATIVAN) 0.5 MG tablet TAKE ONE TABLET BY MOUTH EVERY 8 HOURS AS NEEDED FOR ANXIETY 30 tablet 5  . metoprolol succinate (TOPROL-XL) 50 MG 24 hr tablet TAKE 1 TABLET BY MOUTH TWICE DAILY 180 tablet 3  . Multiple Vitamin (MULTIVITAMIN) tablet Take 1 tablet by mouth daily.    . pantoprazole (PROTONIX) 40 MG tablet TAKE ONE TABLET BY MOUTH TWICE DAILY 180 tablet 2  . sucralfate (CARAFATE) 1 g tablet TAKE 1 TABLET BY MOUTH 4 TIMES DAILY 360 tablet 1  . tamsulosin (FLOMAX) 0.4 MG CAPS capsule TAKE 1 CAPSULE BY MOUTH ONCE DAILY 90 capsule 3  . montelukast (SINGULAIR) 10 MG tablet Take 1 tablet (10 mg total) by mouth at bedtime. 30 tablet 3   No current facility-administered medications for this visit.    No Known Allergies  Health Maintenance Health Maintenance  Topic Date Due  . INFLUENZA VACCINE  02/20/2018  . TETANUS/TDAP  05/22/2027  . PNA vac Low Risk Adult  Completed     Exam:  BP (!) 156/74   Pulse 78   Ht 5\' 9"  (1.753 m)   Wt 153 lb (69.4 kg)   BMI 22.59 kg/m  Gen: Well NAD HEENT: EOMI,  MMM clear nasal discharge.  Lungs: Normal work of breathing. CTABL Heart: RRR no MRG Abd: NABS, Soft. Nondistended, Nontender Exts: Brisk capillary refill, warm and well perfused.       Assessment and Plan: 84 y.o. male with  Allergies.  Seasonal allergies. Continue zyrtec and flonase. Add Singulair for 1-2 months.   Left Knee pain due to DJD: Continue voltaren gel.   Tinnitus: Doubtful that hearing aids would help much.  Recommend white noise machine and watchful waiting.  CVD risk: On appropriate medical  regimen.  Plan to check metabolic panel.  LDL 55 6 months ago.  Continue current antilipid anticoagulation regimen.  Blood pressure elevated today.  Increase lisinopril as noted below.  Chronic kidney disease: Patient also has hypokalemia.  Plan to discontinue potassium and increase lisinopril.  This should help increase potassium and decreased blood pressure.  Check labs in few weeks.   Orders Placed This Encounter  Procedures  . CBC  . COMPLETE METABOLIC PANEL WITH GFR   Meds ordered this encounter  Medications  . furosemide (LASIX) 40 MG tablet    Sig: Take 1 tablet (40 mg total) by mouth daily.    Dispense:  90 tablet  Refill:  1  . montelukast (SINGULAIR) 10 MG tablet    Sig: Take 1 tablet (10 mg total) by mouth at bedtime.    Dispense:  30 tablet    Refill:  3  . lisinopril (PRINIVIL,ZESTRIL) 10 MG tablet    Sig: Take 1 tablet (10 mg total) by mouth daily.    Dispense:  90 tablet    Refill:  1    Please consider 90 day supplies to promote better adherence     Discussed warning signs or symptoms. Please see discharge instructions. Patient expresses understanding.

## 2017-11-19 NOTE — Patient Instructions (Addendum)
Thank you for coming in today. STOP Potassium Increase Lisinopril to 10mg  Get labs in 2-4 weeks.  Recheck with me in 6 months for medicare well exam.   Use Singulair daily for allergies during allergy season.

## 2017-12-16 ENCOUNTER — Other Ambulatory Visit: Payer: Self-pay | Admitting: Family Medicine

## 2017-12-17 ENCOUNTER — Encounter: Payer: Self-pay | Admitting: Family Medicine

## 2017-12-19 DIAGNOSIS — N183 Chronic kidney disease, stage 3 (moderate): Secondary | ICD-10-CM | POA: Diagnosis not present

## 2017-12-19 LAB — CBC
HEMATOCRIT: 38.5 % (ref 38.5–50.0)
HEMOGLOBIN: 13.3 g/dL (ref 13.2–17.1)
MCH: 32.7 pg (ref 27.0–33.0)
MCHC: 34.5 g/dL (ref 32.0–36.0)
MCV: 94.6 fL (ref 80.0–100.0)
MPV: 10.4 fL (ref 7.5–12.5)
Platelets: 254 10*3/uL (ref 140–400)
RBC: 4.07 10*6/uL — AB (ref 4.20–5.80)
RDW: 11.9 % (ref 11.0–15.0)
WBC: 9.4 10*3/uL (ref 3.8–10.8)

## 2017-12-19 LAB — COMPLETE METABOLIC PANEL WITH GFR
AG Ratio: 1.9 (calc) (ref 1.0–2.5)
ALT: 15 U/L (ref 9–46)
AST: 14 U/L (ref 10–35)
Albumin: 3.9 g/dL (ref 3.6–5.1)
Alkaline phosphatase (APISO): 82 U/L (ref 40–115)
BILIRUBIN TOTAL: 0.5 mg/dL (ref 0.2–1.2)
BUN / CREAT RATIO: 17 (calc) (ref 6–22)
BUN: 22 mg/dL (ref 7–25)
CHLORIDE: 105 mmol/L (ref 98–110)
CO2: 26 mmol/L (ref 20–32)
Calcium: 8.7 mg/dL (ref 8.6–10.3)
Creat: 1.33 mg/dL — ABNORMAL HIGH (ref 0.70–1.11)
GFR, Est African American: 57 mL/min/{1.73_m2} — ABNORMAL LOW (ref >=60)
GFR, Est Non African American: 49 mL/min/{1.73_m2} — ABNORMAL LOW (ref >=60)
GLUCOSE: 110 mg/dL — AB (ref 65–99)
Globulin: 2.1 g/dL (calc) (ref 1.9–3.7)
Potassium: 4.4 mmol/L (ref 3.5–5.3)
SODIUM: 140 mmol/L (ref 135–146)
TOTAL PROTEIN: 6 g/dL — AB (ref 6.1–8.1)

## 2017-12-25 ENCOUNTER — Other Ambulatory Visit: Payer: Self-pay | Admitting: Family Medicine

## 2018-01-01 IMAGING — CR DG KNEE COMPLETE 4+V*L*
4 series · 4 of 4 positions shown · non-contrast
Comparison: None in PACs

CLINICAL DATA: Left knee pain for the past week after being on the
knee is while gardening ; right knee series for comparison.

EXAM:
RIGHT KNEE - 1-2 VIEW; LEFT KNEE - COMPLETE 4+ VIEW

[knee lat]
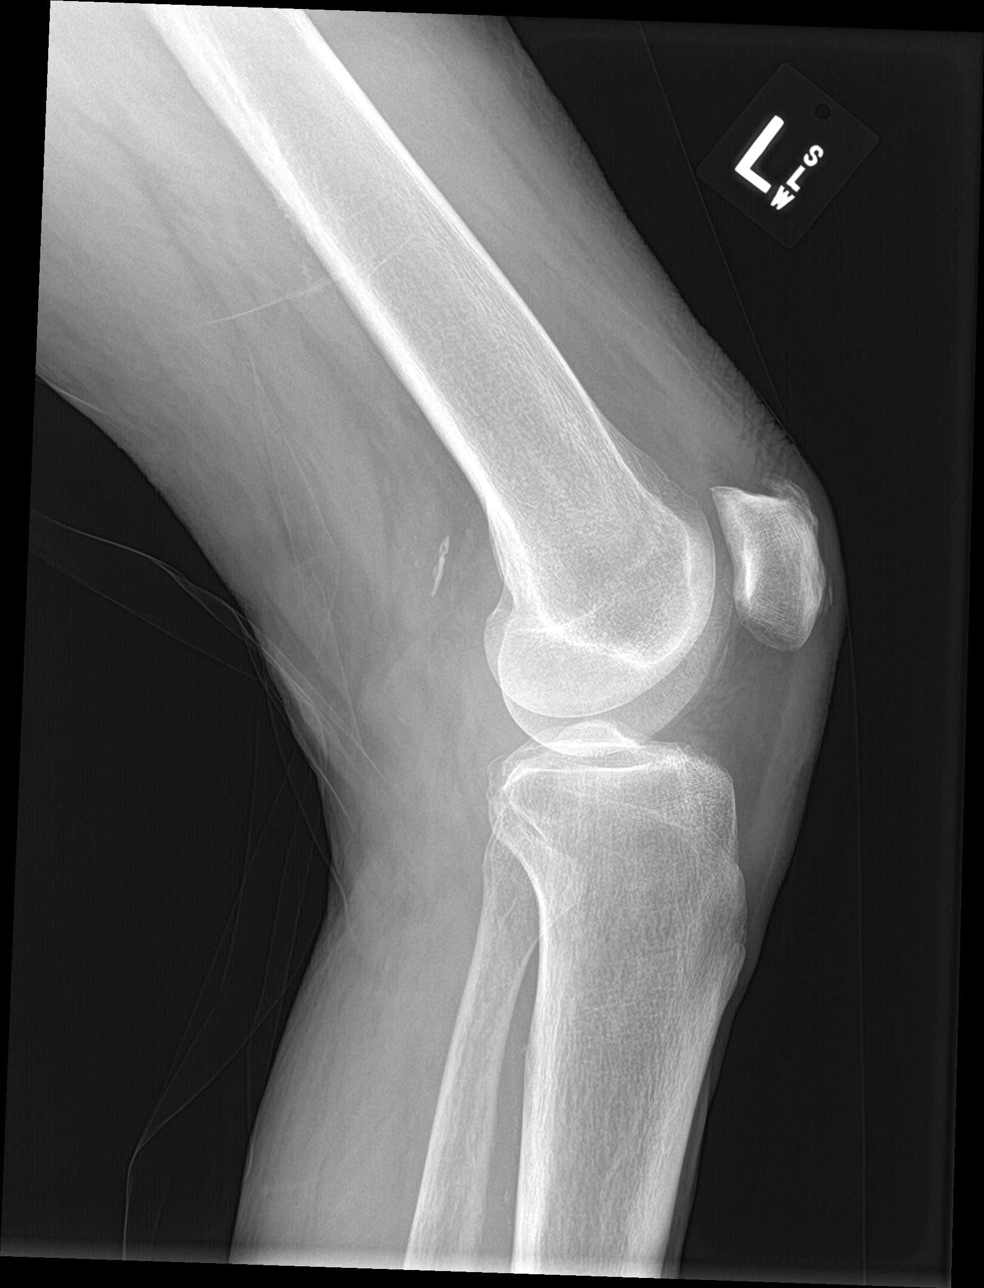

[knee sunrise]
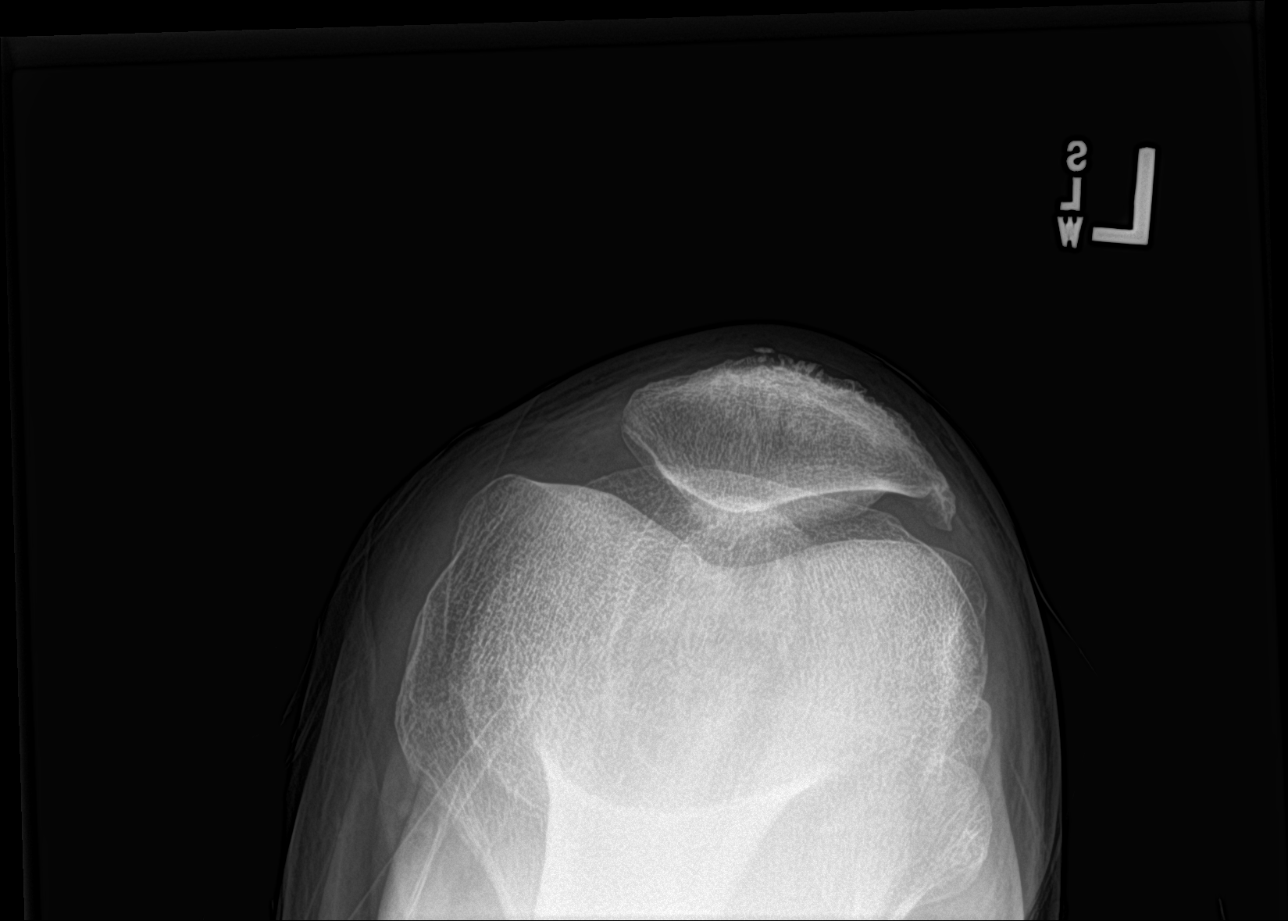

[knee ap bilat standing (1 of 2)]
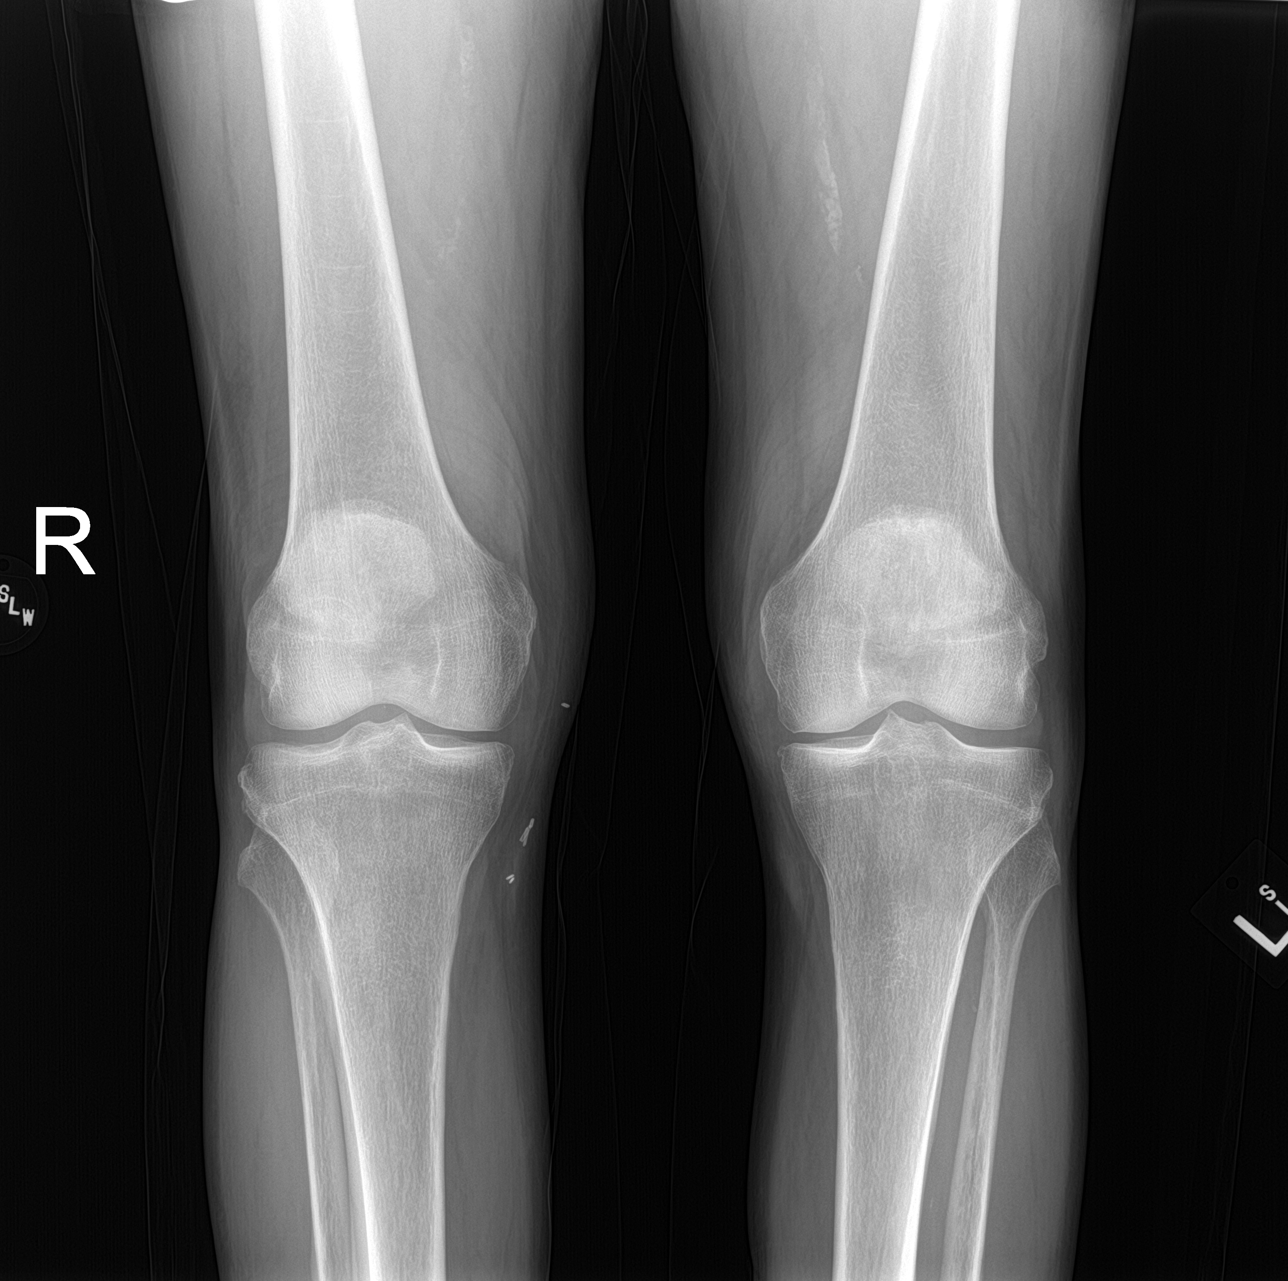

[knee ap bilat standing (2 of 2)]
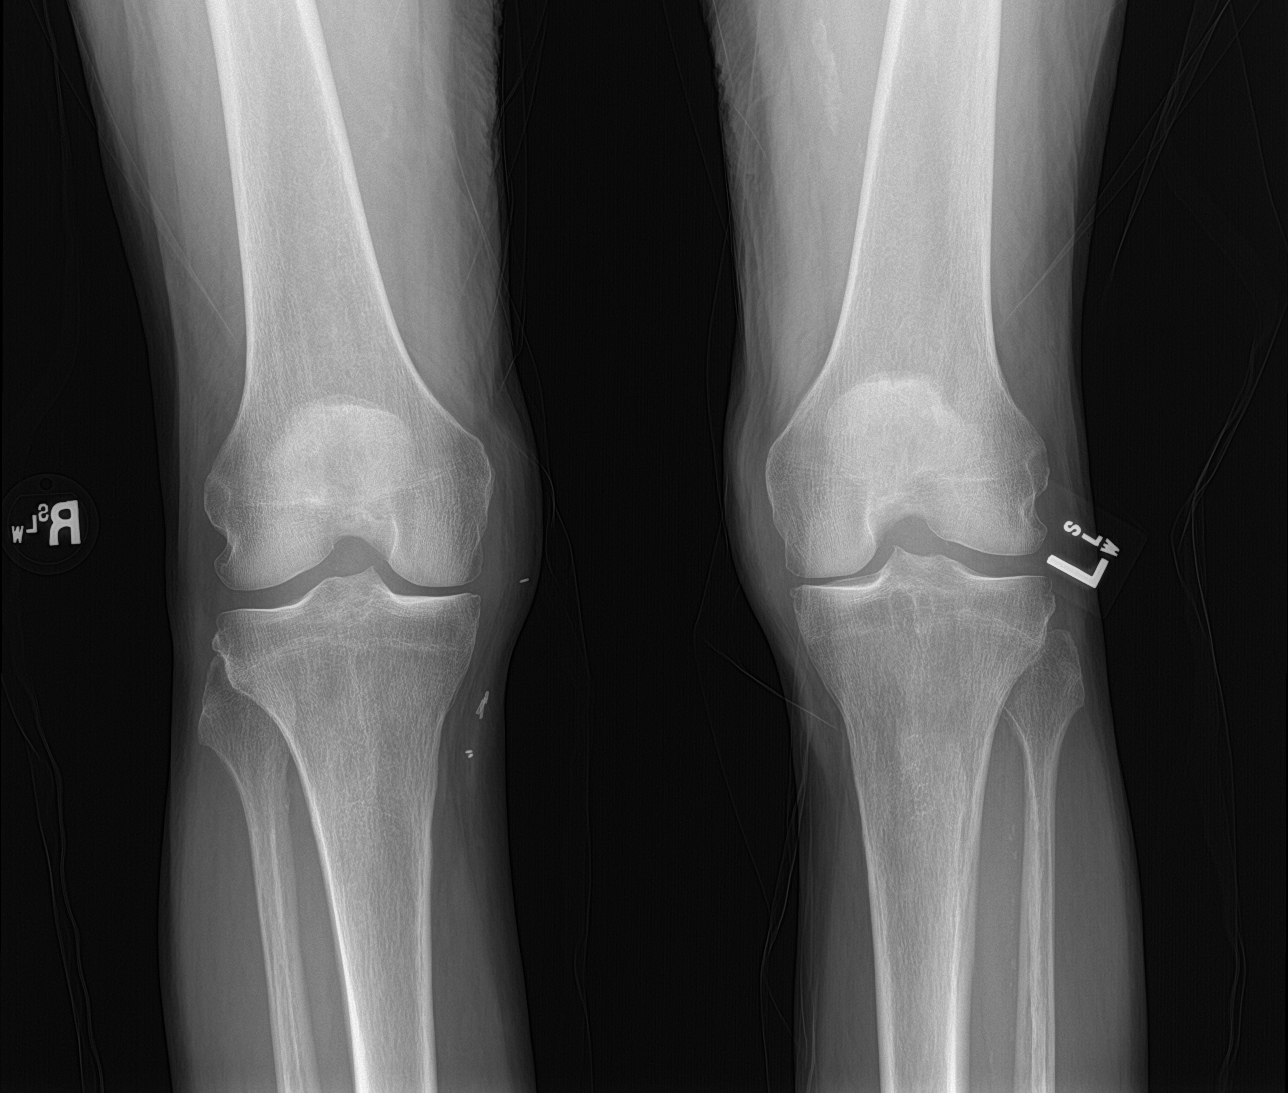

[4 of 4 positions shown; findings below may reference images not displayed]

FINDINGS: The bones are adequately mineralized. There is no acute or healing
fracture. There is narrowing of the medial joint compartments
bilaterally slightly greater on the left than on the right. There is
beaking of the tibial spines. There arm vascular clips in the soft
tissues over the medial aspect of the right knee. There is no joint
effusion or chondrocalcinosis. There spurs arising from the superior
and lateral margins of the left patella.
IMPRESSION: Moderate ostial arthritic joint space loss of the medial
compartments bilaterally greater on the left than on the right. Mild
degenerative change of the left patellofemoral joint. There is no
acute bony abnormality.

## 2018-01-04 IMAGING — CT CT ABD-PELV W/O CM
2 of 4 series · 16 of 46 positions shown, 18 images · non-contrast
Comparison: None.

CLINICAL DATA: Vomiting and constipation for 3 days.

EXAM:
CT ABDOMEN AND PELVIS WITHOUT CONTRAST
TECHNIQUE: Multidetector CT imaging of the abdomen and pelvis was performed
following the standard protocol without IV contrast.

[Series 2: a/p w/o 5mm · axial · non-contrast · 0.85mm/px · z∈[-491,-76]mm · 13 of 93 slices shown, 15 images]
[im 5/93  soft-tissue]
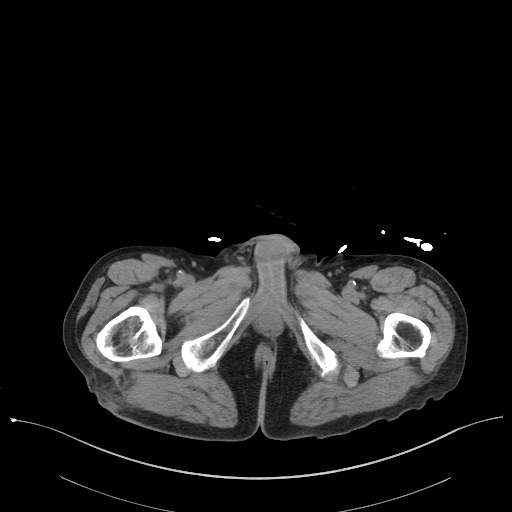
[im 5/93  bone]
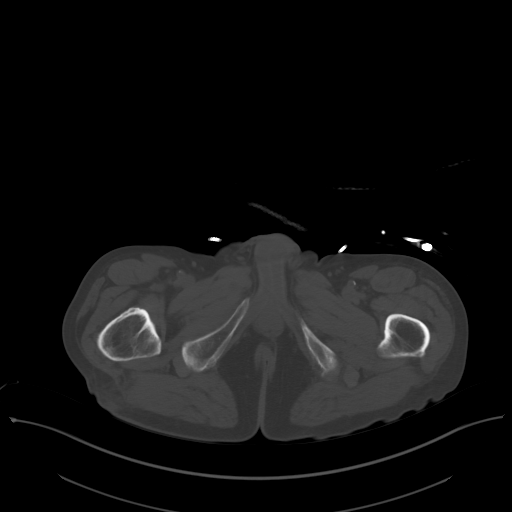
[im 14/93  soft-tissue]
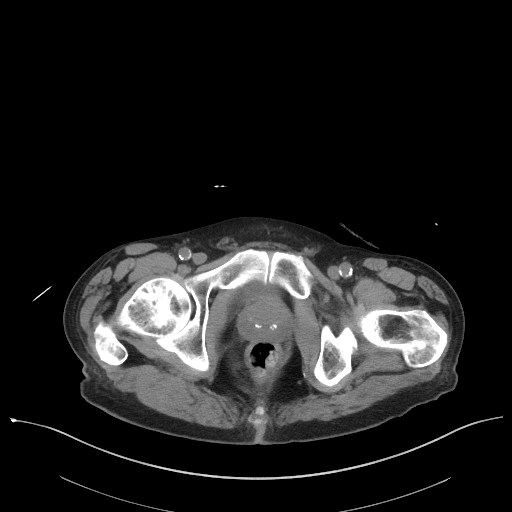
[im 19/93  soft-tissue]
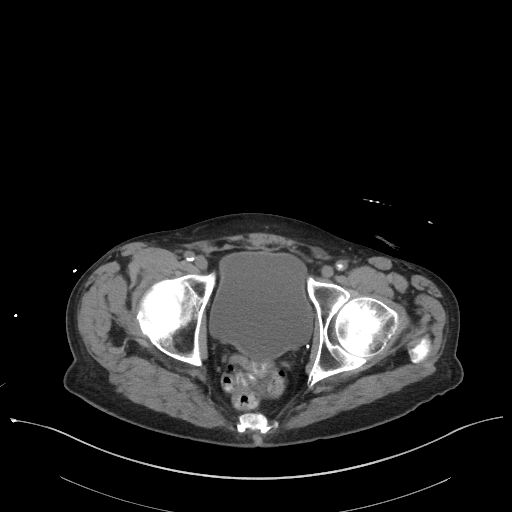
[im 28/93  soft-tissue]
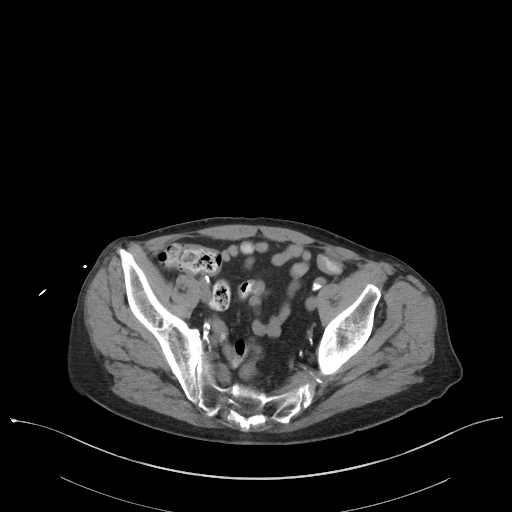
[im 33/93  soft-tissue]
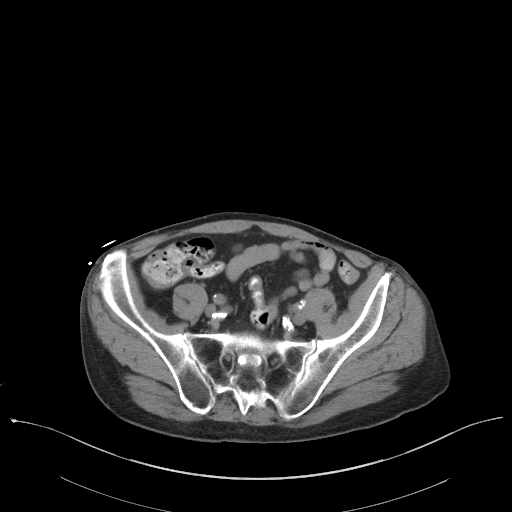
[im 42/93  soft-tissue]
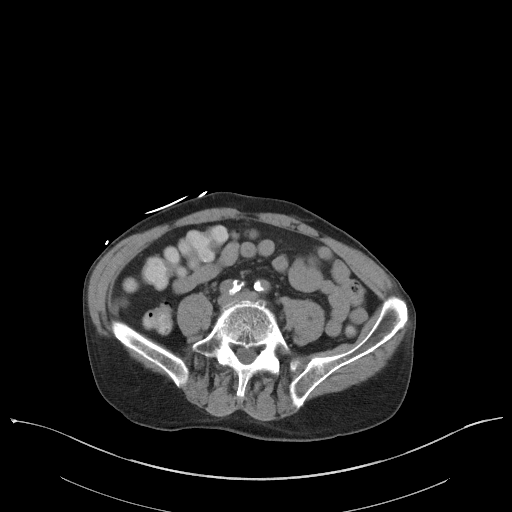
[im 47/93  soft-tissue]
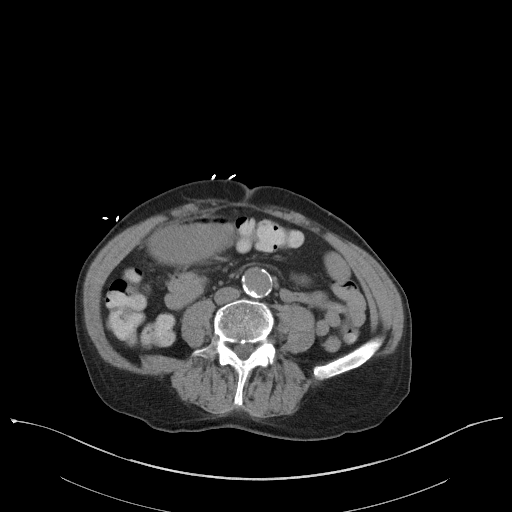
[im 51/93  soft-tissue]
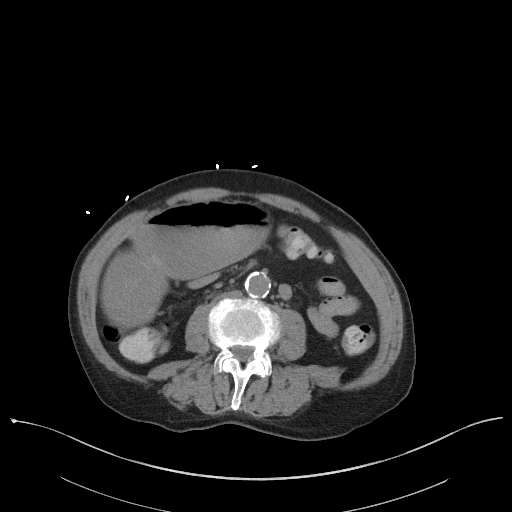
[im 60/93  soft-tissue]
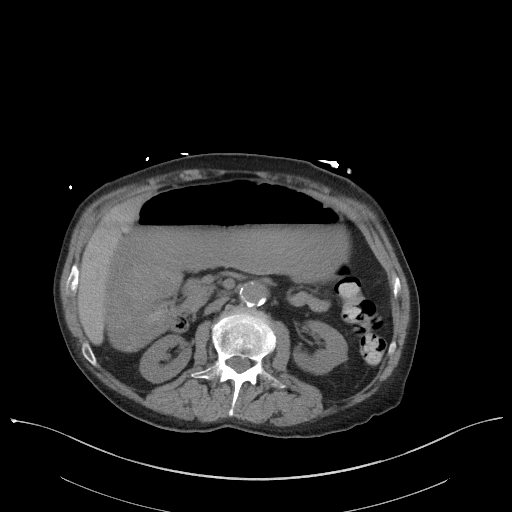
[im 60/93  bone]
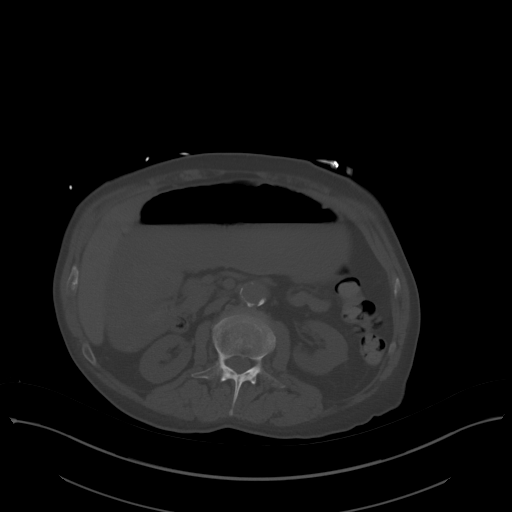
[im 65/93  soft-tissue]
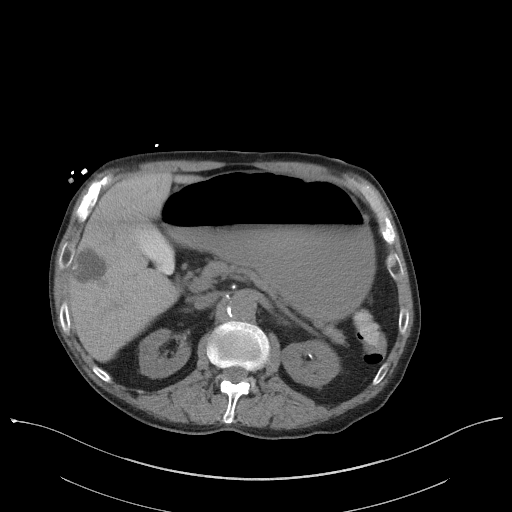
[im 74/93  soft-tissue]
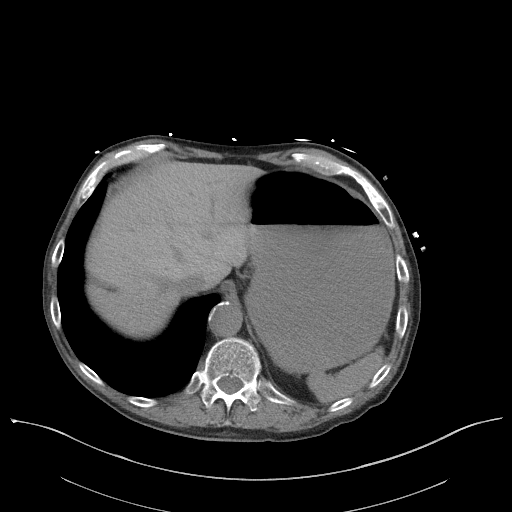
[im 79/93  soft-tissue]
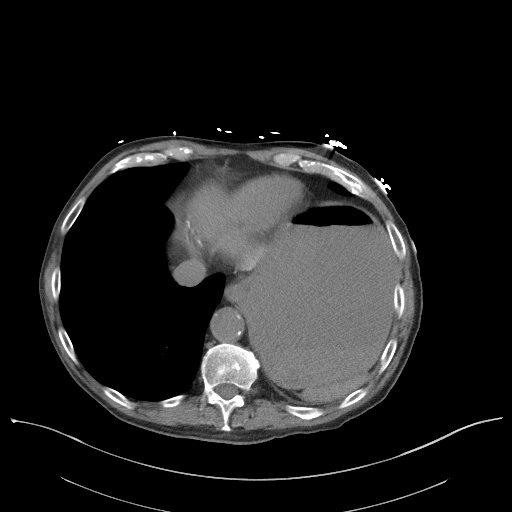
[im 88/93  soft-tissue]
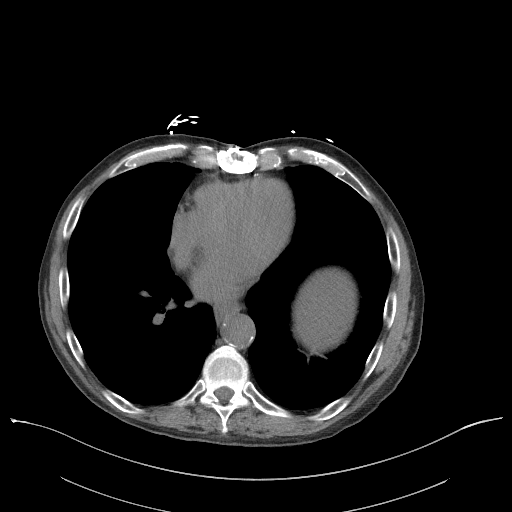

[Series 5: a/p w/o cor · coronal · non-contrast · 0.72mm/px · 3 of 151 slices shown]
[im 51/151  soft-tissue]
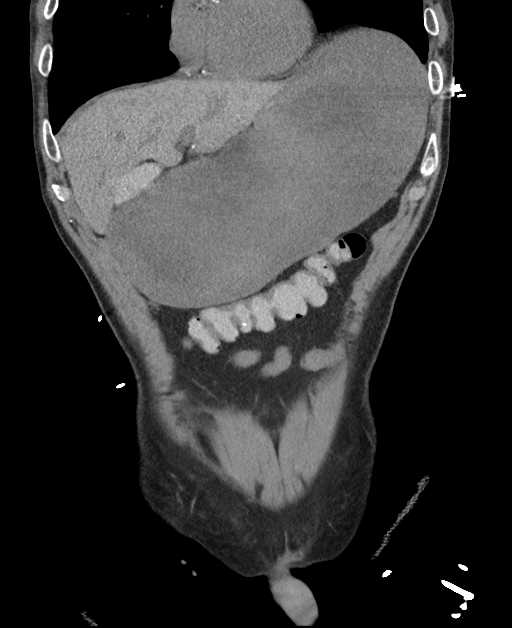
[im 67/151  soft-tissue]
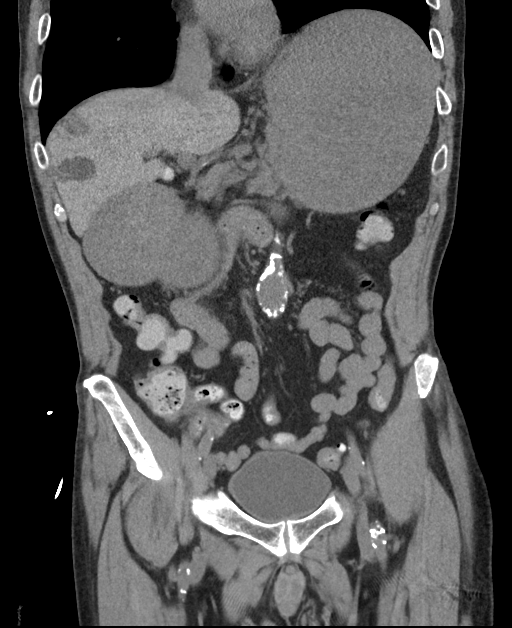
[im 84/151  soft-tissue]
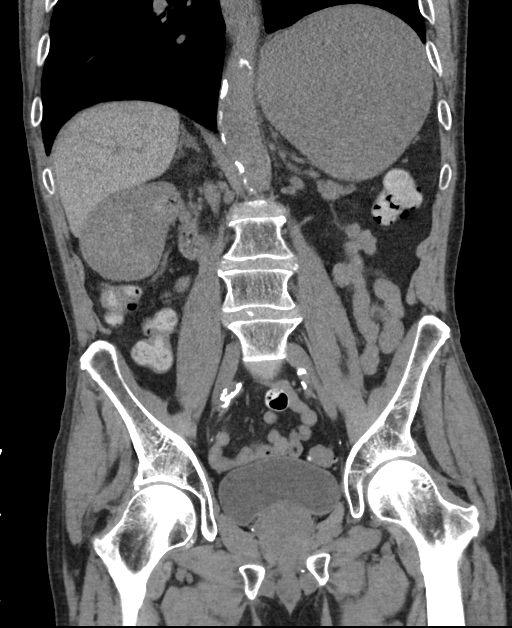

[16 of 46 positions shown; findings below may reference images not displayed]

FINDINGS: Lower chest and abdominal wall: Status post low abdominal wall
hernia repair.

Extensive atherosclerosis, including the coronary arteries. No
pneumonia or aspiration seen in the lower lungs.

Hepatobiliary: Lobulated cyst in the anterior right lobe measuring
up to 3 cm.High-density material in the gallbladder without discrete
calcified stone. No superimposed inflammatory changes

Pancreas: Unremarkable.

Spleen: Granulomatous changes

Adrenals/Urinary Tract: Negative adrenals. Renal hilar
calcifications are elongated and likely vascular. There is smooth
symmetric bilateral renal atrophy. No hydronephrosis Unremarkable
bladder.

Reproductive:Symmetric enlargement of the prostate projecting into
the bladder base

Stomach/Bowel: Dilated stomach with fluid levels, with normalization
of luminal diameter at the level of the pylorus. Semi-solid/ingested
material appears to traverse the pylorus. There is no associated
volvulus. No visible perforation, inflammation, bezoar or
pneumatosis. Probable appendectomy.

Vascular/Lymphatic: Extensive atherosclerosis. No acute vascular
abnormality. No mass or adenopathy.

Peritoneal: No ascites or pneumoperitoneum.

Musculoskeletal: No acute abnormalities. Usual degenerative changes
for age.
IMPRESSION: Over distended stomach with normalization at the pylorus. No
identifiable cause for a gastric outlet obstruction.

## 2018-01-04 IMAGING — DX DG CHEST 2V
2 series · 2 of 2 positions shown · non-contrast
Comparison: None.

CLINICAL DATA: Cough for 4 days

EXAM:
CHEST  2 VIEW

[w chest pa]
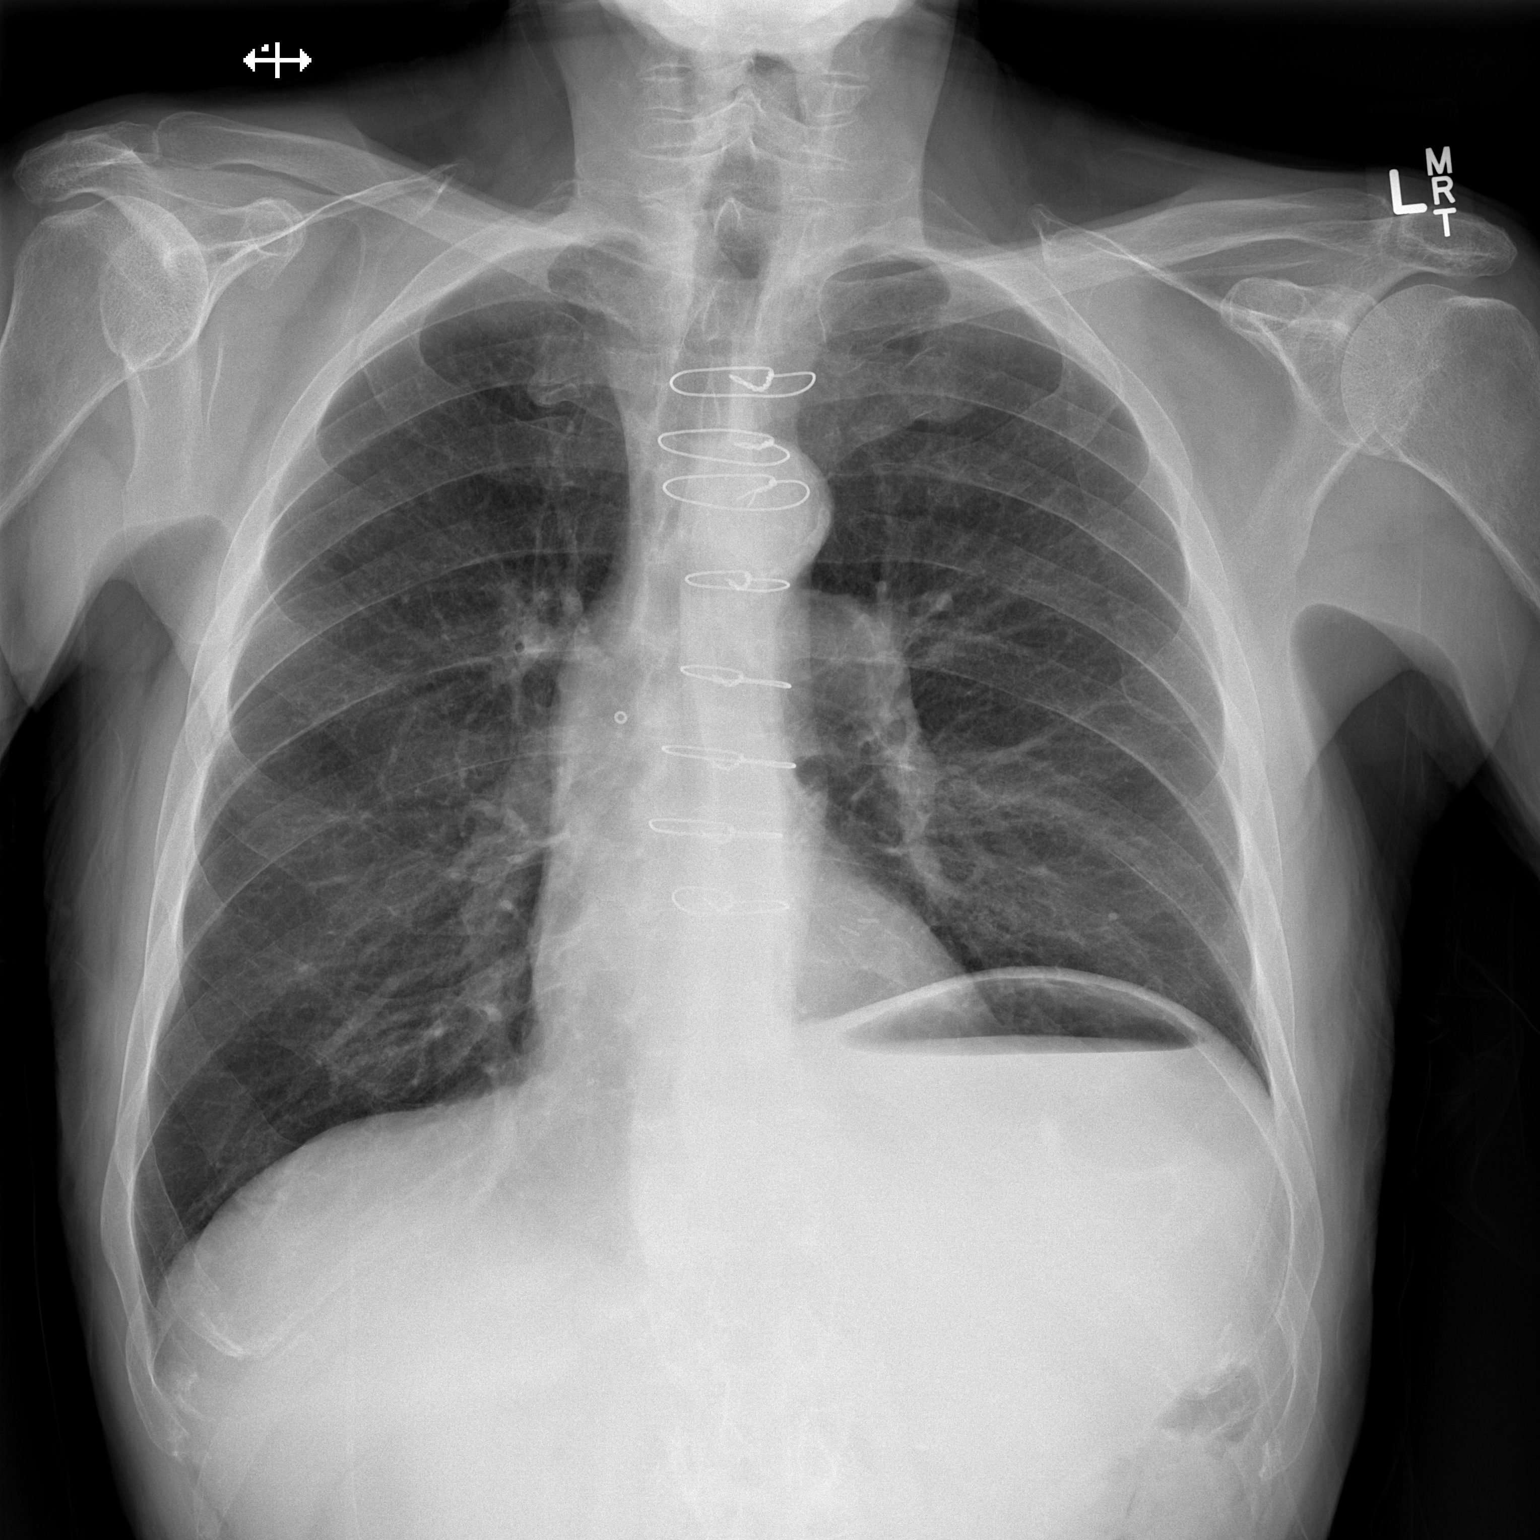

[w chest lat]
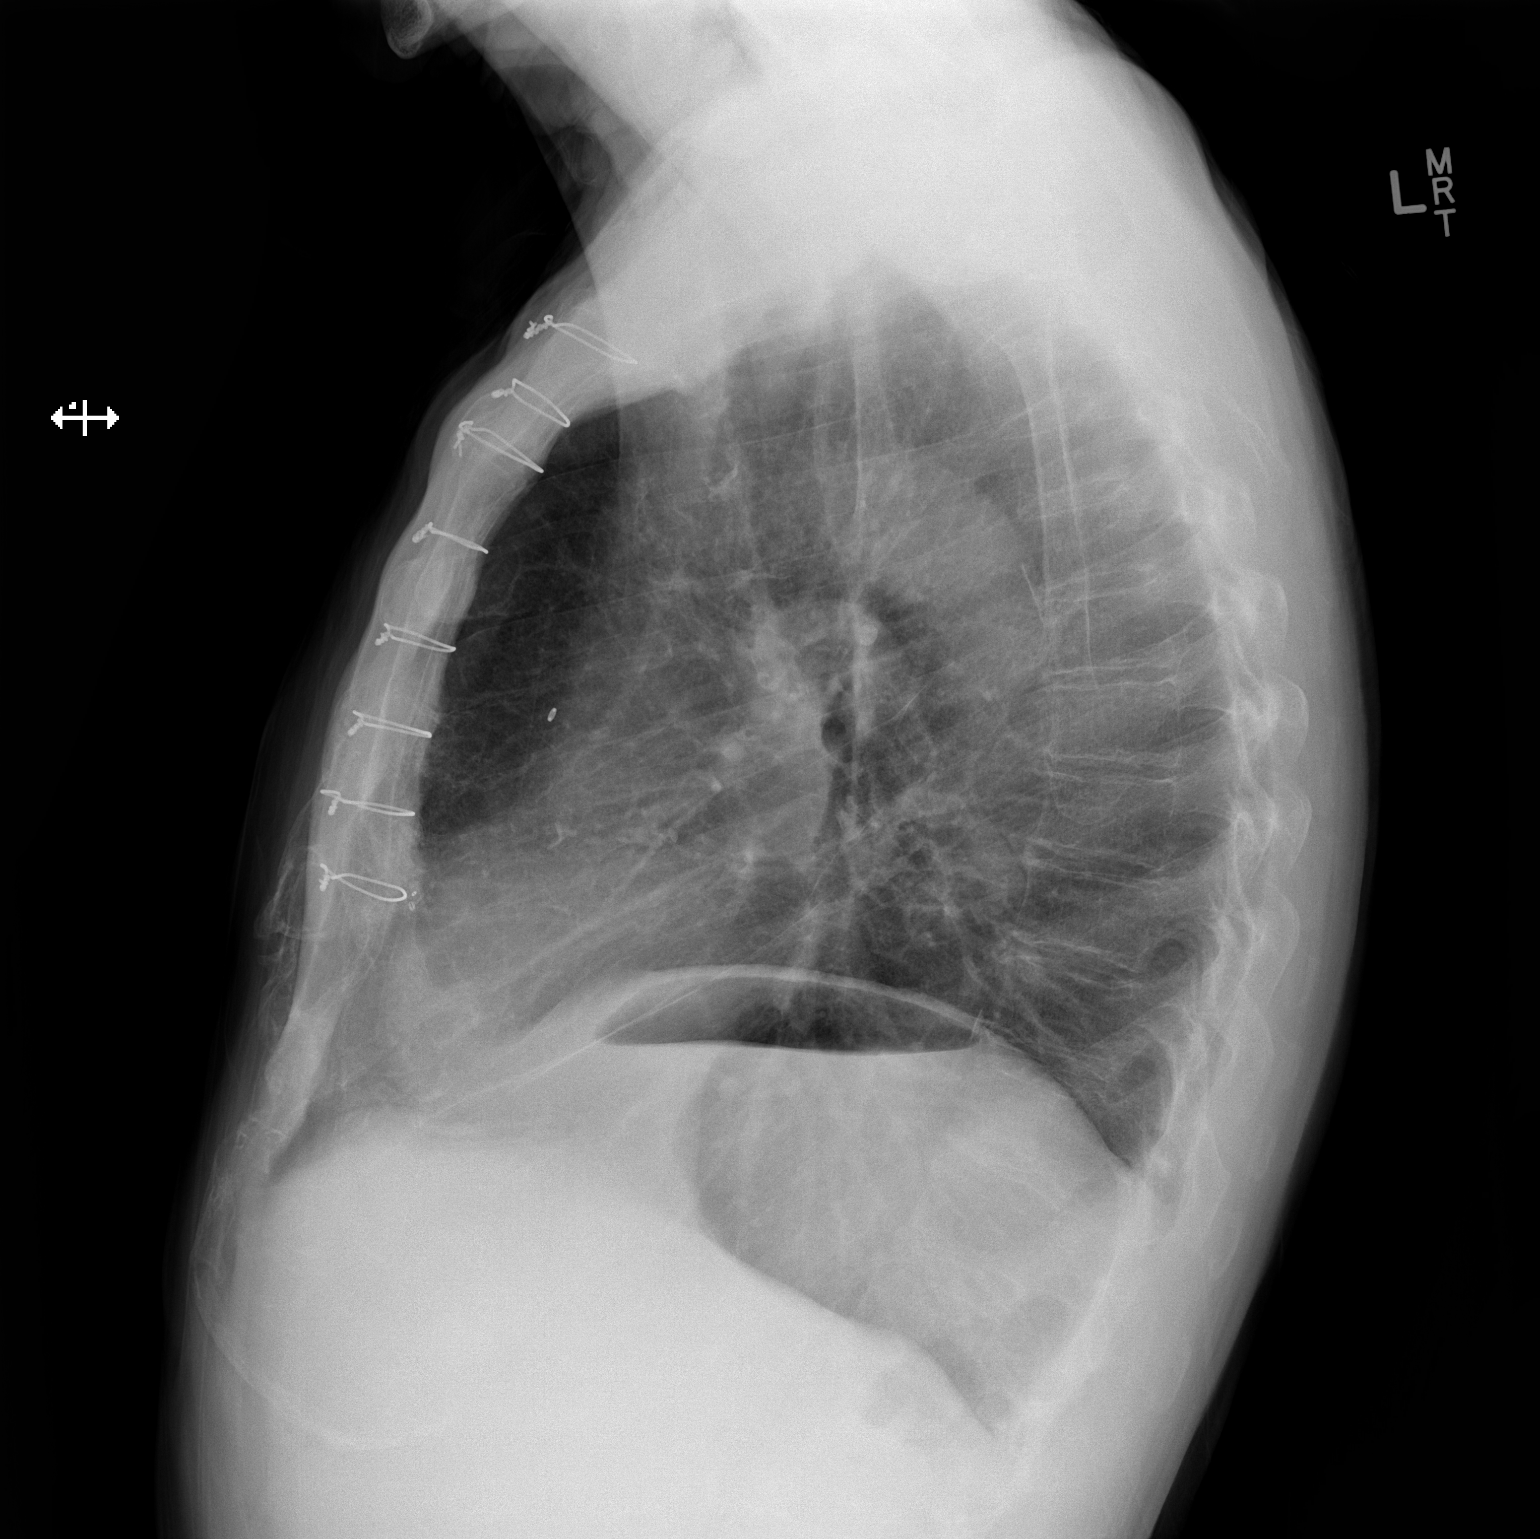

[2 of 2 positions shown; findings below may reference images not displayed]

FINDINGS: The heart size and mediastinal contours are within normal limits.
Both lungs are clear. The visualized skeletal structures are
unremarkable.
IMPRESSION: No active cardiopulmonary disease.

## 2018-01-21 ENCOUNTER — Other Ambulatory Visit: Payer: Self-pay | Admitting: Family Medicine

## 2018-01-21 DIAGNOSIS — N410 Acute prostatitis: Secondary | ICD-10-CM

## 2018-02-24 ENCOUNTER — Other Ambulatory Visit: Payer: Self-pay | Admitting: Family Medicine

## 2018-03-11 DIAGNOSIS — C44519 Basal cell carcinoma of skin of other part of trunk: Secondary | ICD-10-CM | POA: Diagnosis not present

## 2018-03-11 DIAGNOSIS — D485 Neoplasm of uncertain behavior of skin: Secondary | ICD-10-CM | POA: Diagnosis not present

## 2018-03-11 DIAGNOSIS — L570 Actinic keratosis: Secondary | ICD-10-CM | POA: Diagnosis not present

## 2018-03-11 DIAGNOSIS — L821 Other seborrheic keratosis: Secondary | ICD-10-CM | POA: Diagnosis not present

## 2018-03-11 DIAGNOSIS — Z08 Encounter for follow-up examination after completed treatment for malignant neoplasm: Secondary | ICD-10-CM | POA: Diagnosis not present

## 2018-03-11 DIAGNOSIS — Z85828 Personal history of other malignant neoplasm of skin: Secondary | ICD-10-CM | POA: Diagnosis not present

## 2018-03-16 ENCOUNTER — Other Ambulatory Visit: Payer: Self-pay | Admitting: Family Medicine

## 2018-05-14 ENCOUNTER — Other Ambulatory Visit: Payer: Self-pay | Admitting: Family Medicine

## 2018-05-14 DIAGNOSIS — N183 Chronic kidney disease, stage 3 unspecified: Secondary | ICD-10-CM

## 2018-05-14 DIAGNOSIS — I25810 Atherosclerosis of coronary artery bypass graft(s) without angina pectoris: Secondary | ICD-10-CM

## 2018-05-23 ENCOUNTER — Encounter: Payer: Medicare Other | Admitting: Family Medicine

## 2018-05-23 ENCOUNTER — Ambulatory Visit (INDEPENDENT_AMBULATORY_CARE_PROVIDER_SITE_OTHER): Payer: Medicare Other | Admitting: Family Medicine

## 2018-05-23 ENCOUNTER — Encounter: Payer: Self-pay | Admitting: Family Medicine

## 2018-05-23 VITALS — BP 167/72 | HR 64 | Temp 97.9°F | Ht 69.0 in | Wt 154.0 lb

## 2018-05-23 DIAGNOSIS — Z23 Encounter for immunization: Secondary | ICD-10-CM | POA: Diagnosis not present

## 2018-05-23 DIAGNOSIS — E785 Hyperlipidemia, unspecified: Secondary | ICD-10-CM | POA: Diagnosis not present

## 2018-05-23 DIAGNOSIS — N183 Chronic kidney disease, stage 3 unspecified: Secondary | ICD-10-CM

## 2018-05-23 DIAGNOSIS — I5022 Chronic systolic (congestive) heart failure: Secondary | ICD-10-CM

## 2018-05-23 DIAGNOSIS — I25810 Atherosclerosis of coronary artery bypass graft(s) without angina pectoris: Secondary | ICD-10-CM | POA: Diagnosis not present

## 2018-05-23 DIAGNOSIS — I739 Peripheral vascular disease, unspecified: Secondary | ICD-10-CM | POA: Diagnosis not present

## 2018-05-23 DIAGNOSIS — N419 Inflammatory disease of prostate, unspecified: Secondary | ICD-10-CM

## 2018-05-23 DIAGNOSIS — I10 Essential (primary) hypertension: Secondary | ICD-10-CM

## 2018-05-23 DIAGNOSIS — Z125 Encounter for screening for malignant neoplasm of prostate: Secondary | ICD-10-CM

## 2018-05-23 DIAGNOSIS — N184 Chronic kidney disease, stage 4 (severe): Secondary | ICD-10-CM | POA: Insufficient documentation

## 2018-05-23 NOTE — Progress Notes (Signed)
Bradley Stanley is a 84 y.o. male who presents to [place]: [facility] today for follow-up hypertension, chronic kidney disease, discuss new knot on right arm.  Current takes blood pressure medications listed below.  Most the time he feels fine with no chest pain palpitations lightheadedness or dizziness.  He checks his blood pressure at home and notes that it is typically in the 130s.  He feels well and is exerting himself fully at times with gardening.  He is happy with how things are going.  He notes today's blood pressure is not typical.  Additionally Bradley Stanley notes a new bump in the subcutaneous tissue of his right arm.  This is been present for a few months and occurred without injury or trauma.  He notes is not growing or changing or painful.  He feels well otherwise.  Night sweats weight loss fevers.  Chronic kidney disease: Bradley Stanley has mild CKD 3.  He feels well with no significant leg swelling.  He takes lisinopril and furosemide as below.  Additionally Bradley Stanley has a history of peripheral vascular disease.  He denies any significant leg pain with exertion; he denies any ulcers or wounds on his legs.  Additionally Bradley Stanley is bothersome tinnitus bilaterally.  This is quite obnoxious and does interfere with sleep at times.  He has been seen by an audiologist and notes that there is not much to do for this.  He has tried using a noise machine at night.  He like to go back and get a second opinion with another audiologist.  ROS as above:  Exam:  BP (!) 167/72   Pulse 64   Temp 97.9 F (36.6 C) (Oral)   Ht 5\' 9"  (1.753 m)   Wt 154 lb (69.9 kg)   BMI 22.74 kg/m  Wt Readings from Last 5 Encounters:  05/23/18 154 lb (69.9 kg)  11/19/17 153 lb (69.4 kg)  10/16/17 156 lb 12.8 oz (71.1 kg)  05/21/17 152 lb (68.9 kg)  03/11/17 153 lb (69.4 kg)    Gen: Well NAD HEENT: EOMI,  MMM Lungs:  Normal work of breathing. CTABL Heart: RRR no MRG Abd: NABS, Soft. Nondistended, Nontender Exts: Brisk capillary refill, warm and well perfused.  Skin: Soft less than 1 cm subcutaneous nodule right volar forearm near superficial vein.  Nontender mobile nonpulsatile.     Assessment and Plan: 84 y.o. male with  Hypertension: Blood pressure elevated today beyond typical for [facility].  Plan for watchful waiting at home blood pressure log.  Check metabolic panel and recheck in 6 months or sooner if needed.  Additionally will check CMP to follow-up kidney disease.  Subcutaneous nodules likely lipoma.  Watchful waiting if worsening plan for excisional biopsy.  PSA given history of prostatitis and for prostate cancer screening.  Reasonable to check back with audiology for tinnitus.  Not much to do likely however.  Influenza vaccine given today.  PAD doing well watchful waiting.   Orders Placed This Encounter  Procedures  . Flu vaccine HIGH DOSE PF  . CBC  . COMPLETE METABOLIC PANEL WITH GFR  . Lipid Panel w/reflex Direct LDL  . PSA   No orders of the defined types were placed in this encounter.    Historical information moved to improve visibility of documentation.  Past Medical History:  Diagnosis Date  . A-fib ([address]) 04/2015   after CABG.  Abixiban initiated.   . Acute blood loss anemia   .  Anxiety    "occasionally" (11/03/2015)  . BPH (benign prostatic hyperplasia)   . CAD (coronary artery disease)   . Chronic bronchitis ([place])   . GERD (gastroesophageal reflux disease)   . Heart murmur   . High cholesterol   . Hypertension   . Myocardial infarction ([place]) 04/2015  . Pneumonia 05/2015   "after heart surgery"  . Pyloric ulcer   . Renal insufficiency    Past Surgical History:  Procedure Laterality Date  . CATARACT EXTRACTION W/ INTRAOCULAR LENS  IMPLANT, BILATERAL    . CORONARY ARTERY BYPASS GRAFT  04/2015   CABG "X4; [place]"  . ESOPHAGOGASTRODUODENOSCOPY N/A  11/06/2015   Procedure: ESOPHAGOGASTRODUODENOSCOPY (EGD);  Surgeon: Jerene Bears, MD;  Location: [facility] ENDOSCOPY;  Service: Endoscopy;  Laterality: N/A;  . HAND RECONSTRUCTION Bilateral 1970s   "tore up by garage door spring"  . INGUINAL HERNIA REPAIR Bilateral    Social History   Tobacco Use  . Smoking status: Current Some Day Smoker    Packs/day: 0.50    Years: 65.00    Pack years: 32.50    Types: Cigarettes  . Smokeless tobacco: Never Used  Substance Use Topics  . Alcohol use: Yes    Alcohol/week: 0.0 standard drinks    Comment: 11/03/2015 "not drinking anything now"   family history includes CAD in his father; Diabetes in his brother and sister.  Medications: Current Outpatient Medications  Medication Sig Dispense Refill  . aspirin EC 81 MG tablet Take 1 tablet (81 mg total) by mouth daily.    Marland Kitchen atorvastatin (LIPITOR) 80 MG tablet Take 1 tablet (80 mg total) by mouth daily. 90 tablet 3  . Cetirizine HCl 10 MG CAPS Take 10 mg by mouth daily.    . diclofenac sodium (VOLTAREN) 1 % GEL Apply 4 g topically 4 (four) times daily. To affected joint. 100 g 11  . doxazosin (CARDURA) 4 MG tablet TAKE 1 TABLET BY MOUTH ONCE DAILY 90 tablet 1  . furosemide (LASIX) 40 MG tablet TAKE 1 TABLET BY MOUTH ONCE DAILY 90 tablet 1  . ipratropium (ATROVENT) 0.06 % nasal spray Place 2 sprays into both nostrils every 4 (four) hours as needed for rhinitis. 10 mL 6  . lisinopril (PRINIVIL,ZESTRIL) 10 MG tablet TAKE 1 TABLET BY MOUTH ONCE DAILY 90 tablet 1  . LORazepam (ATIVAN) 0.5 MG tablet TAKE 1 TABLET BY MOUTH EVERY 8 HOURS AS NEEDED FOR ANXIETY 30 tablet 5  . metoprolol succinate (TOPROL-XL) 50 MG 24 hr tablet TAKE 1 TABLET BY MOUTH TWICE DAILY 180 tablet 3  . montelukast (SINGULAIR) 10 MG tablet TAKE 1 TABLET BY MOUTH AT BEDTIME 30 tablet 3  . Multiple Vitamin (MULTIVITAMIN) tablet Take 1 tablet by mouth daily.    . pantoprazole (PROTONIX) 40 MG tablet TAKE 1 TABLET BY MOUTH TWICE DAILY 180 tablet 2    . PROAIR HFA 108 (90 Base) MCG/ACT inhaler INHALE 2 PUFFS INTO LUNGS EVERY 6 HOURS AS NEEDED FOR WHEEZING OR SHORTNESS OF BREATH 27 each 3  . sucralfate (CARAFATE) 1 g tablet TAKE 1 TABLET BY MOUTH 4 TIMES DAILY 360 tablet 1  . tamsulosin (FLOMAX) 0.4 MG CAPS capsule TAKE 1 CAPSULE BY MOUTH ONCE DAILY 90 capsule 3   No current facility-administered medications for this visit.    No Known Allergies   Discussed warning signs or symptoms. Please see discharge instructions. Patient expresses understanding.

## 2018-05-23 NOTE — Patient Instructions (Signed)
Thank you for coming in today.  Get fasting labs today.  Recheck in 6 months if all is well.  Keep an eye on the lump in the right forearm.  Reasonable to follow back up with Audiology about tinnitus.   Lipoma A lipoma is a noncancerous (benign) tumor that is made up of fat cells. This is a very common type of soft-tissue growth. Lipomas are usually found under the skin (subcutaneous). They may occur in any tissue of the body that contains fat. Common areas for lipomas to appear include the back, shoulders, buttocks, and thighs. Lipomas grow slowly, and they are usually painless. Most lipomas do not cause problems and do not require treatment. What are the causes? The cause of this condition is not known. What increases the risk? This condition is more likely to develop in:  People who are 56-41 years old.  People who have a family history of lipomas.  What are the signs or symptoms? A lipoma usually appears as a small, round bump under the skin. It may feel soft or rubbery, but the firmness can vary. Most lipomas are not painful. However, a lipoma may become painful if it is located in an area where it pushes on nerves. How is this diagnosed? A lipoma can usually be diagnosed with a physical exam. You may also have tests to confirm the diagnosis and to rule out other conditions. Tests may include:  Imaging tests, such as a CT scan or MRI.  Removal of a tissue sample to be looked at under a microscope (biopsy).  How is this treated? Treatment is not needed for small lipomas that are not causing problems. If a lipoma continues to get bigger or it causes problems, removal is often the best option. Lipomas can also be removed to improve appearance. Removal of a lipoma is usually done with a surgery in which the fatty cells and the surrounding capsule are removed. Most often, a medicine that numbs the area (local anesthetic) is used for this procedure. Follow these instructions at  home:  Keep all follow-up visits as directed by your health care provider. This is important. Contact a health care provider if:  Your lipoma becomes larger or hard.  Your lipoma becomes painful, red, or increasingly swollen. These could be signs of infection or a more serious condition. This information is not intended to replace advice given to you by your health care provider. Make sure you discuss any questions you have with your health care provider. Document Released: 06/29/2002 Document Revised: 12/15/2015 Document Reviewed: 07/05/2014 Elsevier Interactive Patient Education  [employer].

## 2018-05-27 DIAGNOSIS — I10 Essential (primary) hypertension: Secondary | ICD-10-CM | POA: Diagnosis not present

## 2018-05-27 DIAGNOSIS — I739 Peripheral vascular disease, unspecified: Secondary | ICD-10-CM | POA: Diagnosis not present

## 2018-05-27 DIAGNOSIS — N419 Inflammatory disease of prostate, unspecified: Secondary | ICD-10-CM | POA: Diagnosis not present

## 2018-05-27 DIAGNOSIS — Z125 Encounter for screening for malignant neoplasm of prostate: Secondary | ICD-10-CM | POA: Diagnosis not present

## 2018-05-27 DIAGNOSIS — E785 Hyperlipidemia, unspecified: Secondary | ICD-10-CM | POA: Diagnosis not present

## 2018-05-27 DIAGNOSIS — N183 Chronic kidney disease, stage 3 (moderate): Secondary | ICD-10-CM | POA: Diagnosis not present

## 2018-05-27 LAB — LIPID PANEL W/REFLEX DIRECT LDL
Cholesterol: 112 mg/dL (ref <200)
HDL: 38 mg/dL — ABNORMAL LOW (ref >40)
LDL Cholesterol (Calc): 59 mg/dL (calc)
Non-HDL Cholesterol (Calc): 74 mg/dL (calc) (ref <130)
Total CHOL/HDL Ratio: 2.9 (calc) (ref <5.0)
Triglycerides: 72 mg/dL (ref <150)

## 2018-05-27 LAB — COMPLETE METABOLIC PANEL WITH GFR
AG RATIO: 1.6 (calc) (ref 1.0–2.5)
ALT: 15 U/L (ref 9–46)
AST: 16 U/L (ref 10–35)
Albumin: 3.9 g/dL (ref 3.6–5.1)
Alkaline phosphatase (APISO): 98 U/L (ref 40–115)
BILIRUBIN TOTAL: 0.3 mg/dL (ref 0.2–1.2)
BUN / CREAT RATIO: 16 (calc) (ref 6–22)
BUN: 21 mg/dL (ref 7–25)
CHLORIDE: 104 mmol/L (ref 98–110)
CO2: 28 mmol/L (ref 20–32)
Calcium: 8.7 mg/dL (ref 8.6–10.3)
Creat: 1.29 mg/dL — ABNORMAL HIGH (ref 0.70–1.11)
GFR, EST AFRICAN AMERICAN: 59 mL/min/{1.73_m2} — AB (ref >=60)
GFR, Est Non African American: 51 mL/min/{1.73_m2} — ABNORMAL LOW (ref >=60)
Globulin: 2.4 g/dL (calc) (ref 1.9–3.7)
Glucose, Bld: 106 mg/dL — ABNORMAL HIGH (ref 65–99)
Potassium: 4.5 mmol/L (ref 3.5–5.3)
Sodium: 138 mmol/L (ref 135–146)
TOTAL PROTEIN: 6.3 g/dL (ref 6.1–8.1)

## 2018-05-27 LAB — CBC
HCT: 39.2 % (ref 38.5–50.0)
HEMOGLOBIN: 13.4 g/dL (ref 13.2–17.1)
MCH: 32.6 pg (ref 27.0–33.0)
MCHC: 34.2 g/dL (ref 32.0–36.0)
MCV: 95.4 fL (ref 80.0–100.0)
MPV: 10.3 fL (ref 7.5–12.5)
PLATELETS: 276 10*3/uL (ref 140–400)
RBC: 4.11 10*6/uL — AB (ref 4.20–5.80)
RDW: 12 % (ref 11.0–15.0)
WBC: 9.8 10*3/uL (ref 3.8–10.8)

## 2018-05-27 LAB — PSA: PSA: 0.7 ng/mL (ref <=4.0)

## 2018-06-05 DIAGNOSIS — H02403 Unspecified ptosis of bilateral eyelids: Secondary | ICD-10-CM | POA: Diagnosis not present

## 2018-06-05 DIAGNOSIS — H43813 Vitreous degeneration, bilateral: Secondary | ICD-10-CM | POA: Diagnosis not present

## 2018-06-05 DIAGNOSIS — Z961 Presence of intraocular lens: Secondary | ICD-10-CM | POA: Diagnosis not present

## 2018-06-05 DIAGNOSIS — H01004 Unspecified blepharitis left upper eyelid: Secondary | ICD-10-CM | POA: Diagnosis not present

## 2018-06-05 DIAGNOSIS — H01001 Unspecified blepharitis right upper eyelid: Secondary | ICD-10-CM | POA: Diagnosis not present

## 2018-06-05 DIAGNOSIS — H527 Unspecified disorder of refraction: Secondary | ICD-10-CM | POA: Diagnosis not present

## 2018-07-17 DIAGNOSIS — L570 Actinic keratosis: Secondary | ICD-10-CM | POA: Diagnosis not present

## 2018-07-17 DIAGNOSIS — C44519 Basal cell carcinoma of skin of other part of trunk: Secondary | ICD-10-CM | POA: Diagnosis not present

## 2018-07-18 ENCOUNTER — Other Ambulatory Visit: Payer: Self-pay | Admitting: Family Medicine

## 2018-07-18 DIAGNOSIS — N410 Acute prostatitis: Secondary | ICD-10-CM

## 2018-07-19 ENCOUNTER — Other Ambulatory Visit: Payer: Self-pay | Admitting: Family Medicine

## 2018-08-20 ENCOUNTER — Other Ambulatory Visit: Payer: Self-pay | Admitting: Family Medicine

## 2018-10-06 ENCOUNTER — Other Ambulatory Visit: Payer: Self-pay | Admitting: Family Medicine

## 2018-10-15 ENCOUNTER — Other Ambulatory Visit: Payer: Self-pay | Admitting: Cardiology

## 2018-10-15 ENCOUNTER — Other Ambulatory Visit: Payer: Self-pay | Admitting: Family Medicine

## 2018-10-15 DIAGNOSIS — I2510 Atherosclerotic heart disease of native coronary artery without angina pectoris: Secondary | ICD-10-CM

## 2018-10-15 DIAGNOSIS — N410 Acute prostatitis: Secondary | ICD-10-CM

## 2018-11-04 ENCOUNTER — Other Ambulatory Visit: Payer: Self-pay | Admitting: Family Medicine

## 2018-11-10 ENCOUNTER — Other Ambulatory Visit: Payer: Self-pay | Admitting: Family Medicine

## 2018-11-10 DIAGNOSIS — N183 Chronic kidney disease, stage 3 unspecified: Secondary | ICD-10-CM

## 2018-11-10 DIAGNOSIS — I25810 Atherosclerosis of coronary artery bypass graft(s) without angina pectoris: Secondary | ICD-10-CM

## 2018-11-19 ENCOUNTER — Ambulatory Visit: Payer: Medicare Other | Admitting: Cardiology

## 2018-12-09 ENCOUNTER — Other Ambulatory Visit: Payer: Self-pay | Admitting: Family Medicine

## 2019-01-01 ENCOUNTER — Other Ambulatory Visit: Payer: Self-pay | Admitting: Family Medicine

## 2019-01-02 ENCOUNTER — Telehealth: Payer: Self-pay

## 2019-01-02 NOTE — Telephone Encounter (Signed)
Spoke with wife and she will pass message along to her husband.

## 2019-01-02 NOTE — Telephone Encounter (Signed)
Gregor Hams, MD 3 hours ago (6:56 AM)     Patient needs to have a follow-up appoint with me.  Okay to schedule phone or preferably video visit with me to keep patient out of office.  Were going to need to get some labs and reassess health

## 2019-01-02 NOTE — Telephone Encounter (Signed)
Sent note to Mardene Celeste to schedule.

## 2019-01-02 NOTE — Telephone Encounter (Signed)
Patient needs to have a follow-up appoint with me.  Okay to schedule phone or preferably video visit with me to keep patient out of office.  Were going to need to get some labs and reassess health.

## 2019-01-06 ENCOUNTER — Encounter: Payer: Self-pay | Admitting: Family Medicine

## 2019-01-06 ENCOUNTER — Ambulatory Visit (INDEPENDENT_AMBULATORY_CARE_PROVIDER_SITE_OTHER): Payer: Medicare Other | Admitting: Family Medicine

## 2019-01-06 VITALS — BP 121/54 | HR 66 | Temp 97.6°F | Wt 156.0 lb

## 2019-01-06 DIAGNOSIS — I480 Paroxysmal atrial fibrillation: Secondary | ICD-10-CM

## 2019-01-06 DIAGNOSIS — I255 Ischemic cardiomyopathy: Secondary | ICD-10-CM | POA: Diagnosis not present

## 2019-01-06 DIAGNOSIS — B370 Candidal stomatitis: Secondary | ICD-10-CM

## 2019-01-06 DIAGNOSIS — I5022 Chronic systolic (congestive) heart failure: Secondary | ICD-10-CM

## 2019-01-06 DIAGNOSIS — N419 Inflammatory disease of prostate, unspecified: Secondary | ICD-10-CM | POA: Diagnosis not present

## 2019-01-06 DIAGNOSIS — I739 Peripheral vascular disease, unspecified: Secondary | ICD-10-CM | POA: Diagnosis not present

## 2019-01-06 DIAGNOSIS — Z1211 Encounter for screening for malignant neoplasm of colon: Secondary | ICD-10-CM | POA: Diagnosis not present

## 2019-01-06 DIAGNOSIS — D500 Iron deficiency anemia secondary to blood loss (chronic): Secondary | ICD-10-CM | POA: Diagnosis not present

## 2019-01-06 DIAGNOSIS — N184 Chronic kidney disease, stage 4 (severe): Secondary | ICD-10-CM | POA: Diagnosis not present

## 2019-01-06 DIAGNOSIS — J410 Simple chronic bronchitis: Secondary | ICD-10-CM

## 2019-01-06 DIAGNOSIS — I10 Essential (primary) hypertension: Secondary | ICD-10-CM

## 2019-01-06 MED ORDER — UMECLIDINIUM-VILANTEROL 62.5-25 MCG/INH IN AEPB: 1.0000 | INHALATION_SPRAY | Freq: Every day | RESPIRATORY_TRACT | 5 refills | Status: DC

## 2019-01-06 MED ORDER — NYSTATIN 100000 UNIT/ML MT SUSP: 500000.0000 [IU] | Freq: Four times a day (QID) | OROMUCOSAL | 12 refills | Status: DC

## 2019-01-06 MED ORDER — MONTELUKAST SODIUM 10 MG PO TABS: 10.0000 mg | ORAL_TABLET | Freq: Every day | ORAL | 1 refills | Status: DC

## 2019-01-06 NOTE — Progress Notes (Signed)
Virtual Visit  I connected with      Bradley Stanley  by a telemedicine application and verified that I am speaking with the correct person using two identifiers.   I discussed the limitations of evaluation and management by telemedicine and the availability of in person appointments. The patient expressed understanding and agreed to proceed.  History of Present Illness: Bradley Stanley is a 85 y.o. male who would like to discuss CKD, hypertension, COPD.  Patient takes medications listed below.  He denies chest pain palpitations shortness of breath.  He notes his blood pressures typically well controlled.  He notes his breathing is generally doing well.  He uses albuterol however twice daily.  He does not have a controller medication.  He denies cough or wheezing but does note some shortness of breath especially with exertion.  He attributes this to COPD.  He has also occasional dry mouth or thrush symptoms.  He is used his wife's leftover nystatin swish and swallow which helps.  He like his own prescription that he can use intermittently if needed.  Observations/Objective: BP (!) 121/54   Pulse 66   Temp 97.6 F (36.4 C) (Oral)   Wt 156 lb (70.8 kg)   SpO2 98%   BMI 23.04 kg/m  Wt Readings from Last 5 Encounters:  01/06/19 156 lb (70.8 kg)  05/23/18 154 lb (69.9 kg)  11/19/17 153 lb (69.4 kg)  10/16/17 156 lb 12.8 oz (71.1 kg)  05/21/17 152 lb (68.9 kg)   Exam: Normal Speech.  No tachypnea.  Lab and Radiology Results No results found for this or any previous visit (from the past 72 hour(s)). No results found.   Assessment and Plan: 85 y.o. male with hypertension and CKD COPD.  Plan to check basic fasting labs to follow-up chronic medical conditions including anemia as well.  Will refill chronic medications.  Given he is using his albuterol more frequently will add Anoro Ellipta for COPD management.  This should help with some of the shortness of breath.  Consider adding inhaled  steroid if needed as well.  Reassess in a few months or sooner if needed.  Recommend flu vaccine this fall.  PDMP not reviewed this encounter. Orders Placed This Encounter  Procedures  . CBC  . COMPLETE METABOLIC PANEL WITH GFR  . PSA  . Fe+TIBC+Fer   Meds ordered this encounter  Medications  . nystatin (MYCOSTATIN) 100000 UNIT/ML suspension    Sig: Take 5 mLs (500,000 Units total) by mouth 4 (four) times daily. Swish for 30 seconds and swallow.    Dispense:  180 mL    Refill:  12  . montelukast (SINGULAIR) 10 MG tablet    Sig: Take 1 tablet (10 mg total) by mouth at bedtime.    Dispense:  90 tablet    Refill:  1  . umeclidinium-vilanterol (ANORO ELLIPTA) 62.5-25 MCG/INH AEPB    Sig: Inhale 1 puff into the lungs daily.    Dispense:  90 each    Refill:  5    Follow Up Instructions:    I discussed the assessment and treatment plan with the patient. The patient was provided an opportunity to ask questions and all were answered. The patient agreed with the plan and demonstrated an understanding of the instructions.   The patient was advised to call back or seek an in-person evaluation if the symptoms worsen or if the condition fails to improve as anticipated.  Time: 25 minutes of intraservice time, with >39  minutes of total time during today's visit.      Historical information moved to improve visibility of documentation.  Past Medical History:  Diagnosis Date  . A-fib ([place]) 04/2015   after CABG.  Abixiban initiated.   . Acute blood loss anemia   . Anxiety    "occasionally" (11/03/2015)  . BPH (benign prostatic hyperplasia)   . CAD (coronary artery disease)   . Chronic bronchitis ([place])   . GERD (gastroesophageal reflux disease)   . Heart murmur   . High cholesterol   . Hypertension   . Myocardial infarction ([place]) 04/2015  . Pneumonia 05/2015   "after heart surgery"  . Pyloric ulcer   . Renal insufficiency    Past Surgical History:  Procedure Laterality Date  .  CATARACT EXTRACTION W/ INTRAOCULAR LENS  IMPLANT, BILATERAL    . CORONARY ARTERY BYPASS GRAFT  04/2015   CABG "X4; [place]"  . ESOPHAGOGASTRODUODENOSCOPY N/A 11/06/2015   Procedure: ESOPHAGOGASTRODUODENOSCOPY (EGD);  Surgeon: Jerene Bears, MD;  Location: [facility] ENDOSCOPY;  Service: Endoscopy;  Laterality: N/A;  . HAND RECONSTRUCTION Bilateral 1970s   "tore up by garage door spring"  . INGUINAL HERNIA REPAIR Bilateral    Social History   Tobacco Use  . Smoking status: Current Some Day Smoker    Packs/day: 0.50    Years: 65.00    Pack years: 32.50    Types: Cigarettes  . Smokeless tobacco: Never Used  Substance Use Topics  . Alcohol use: Yes    Alcohol/week: 0.0 standard drinks    Comment: 11/03/2015 "not drinking anything now"   family history includes CAD in his father; Diabetes in his brother and sister.  Medications: Current Outpatient Medications  Medication Sig Dispense Refill  . aspirin EC 81 MG tablet Take 1 tablet (81 mg total) by mouth daily.    Marland Kitchen atorvastatin (LIPITOR) 80 MG tablet Take 1 tablet by mouth once daily 90 tablet 0  . Cetirizine HCl 10 MG CAPS Take 10 mg by mouth daily.    . diclofenac sodium (VOLTAREN) 1 % GEL Apply 4 g topically 4 (four) times daily. To affected joint. 100 g 11  . doxazosin (CARDURA) 4 MG tablet Take 1 tablet by mouth once daily 90 tablet 0  . furosemide (LASIX) 40 MG tablet Take 1 tablet by mouth once daily 90 tablet 0  . ipratropium (ATROVENT) 0.06 % nasal spray Place 2 sprays into both nostrils every 4 (four) hours as needed for rhinitis. 10 mL 6  . lisinopril (ZESTRIL) 10 MG tablet Take 1 tablet by mouth once daily 90 tablet 0  . LORazepam (ATIVAN) 0.5 MG tablet TAKE 1 TABLET BY MOUTH EVERY 8 HOURS AS NEEDED FOR ANXIETY 30 tablet 5  . metoprolol succinate (TOPROL-XL) 50 MG 24 hr tablet Take 1 tablet by mouth twice daily 180 tablet 0  . montelukast (SINGULAIR) 10 MG tablet Take 1 tablet (10 mg total) by mouth at bedtime. 90 tablet 1  .  Multiple Vitamin (MULTIVITAMIN) tablet Take 1 tablet by mouth daily.    . pantoprazole (PROTONIX) 40 MG tablet Take 1 tablet by mouth twice daily 180 tablet 0  . PROAIR HFA 108 (90 Base) MCG/ACT inhaler INHALE 2 PUFFS INTO LUNGS EVERY 6 HOURS AS NEEDED FOR WHEEZING OR SHORTNESS OF BREATH 27 each 3  . sucralfate (CARAFATE) 1 g tablet TAKE 1 TABLET BY MOUTH 4 TIMES DAILY 360 tablet 0  . tamsulosin (FLOMAX) 0.4 MG CAPS capsule Take 1 capsule by mouth once daily  90 capsule 0  . nystatin (MYCOSTATIN) 100000 UNIT/ML suspension Take 5 mLs (500,000 Units total) by mouth 4 (four) times daily. Swish for 30 seconds and swallow. 180 mL 12  . umeclidinium-vilanterol (ANORO ELLIPTA) 62.5-25 MCG/INH AEPB Inhale 1 puff into the lungs daily. 90 each 5   No current facility-administered medications for this visit.    No Known Allergies

## 2019-01-06 NOTE — Patient Instructions (Signed)
Thank you for coming in today. Get labs soon.  Add Anoro inhailer.  Recheck in 6 months.  Get a flu vaccine this fall.

## 2019-01-12 ENCOUNTER — Other Ambulatory Visit: Payer: Self-pay | Admitting: Cardiology

## 2019-01-12 ENCOUNTER — Other Ambulatory Visit: Payer: Self-pay | Admitting: Family Medicine

## 2019-01-12 DIAGNOSIS — I2510 Atherosclerotic heart disease of native coronary artery without angina pectoris: Secondary | ICD-10-CM

## 2019-01-12 DIAGNOSIS — N410 Acute prostatitis: Secondary | ICD-10-CM

## 2019-01-13 DIAGNOSIS — I739 Peripheral vascular disease, unspecified: Secondary | ICD-10-CM | POA: Diagnosis not present

## 2019-01-13 DIAGNOSIS — N419 Inflammatory disease of prostate, unspecified: Secondary | ICD-10-CM | POA: Diagnosis not present

## 2019-01-13 DIAGNOSIS — I5022 Chronic systolic (congestive) heart failure: Secondary | ICD-10-CM | POA: Diagnosis not present

## 2019-01-13 DIAGNOSIS — I10 Essential (primary) hypertension: Secondary | ICD-10-CM | POA: Diagnosis not present

## 2019-01-13 DIAGNOSIS — I255 Ischemic cardiomyopathy: Secondary | ICD-10-CM | POA: Diagnosis not present

## 2019-01-13 DIAGNOSIS — D500 Iron deficiency anemia secondary to blood loss (chronic): Secondary | ICD-10-CM | POA: Diagnosis not present

## 2019-01-13 DIAGNOSIS — N184 Chronic kidney disease, stage 4 (severe): Secondary | ICD-10-CM | POA: Diagnosis not present

## 2019-01-13 DIAGNOSIS — I480 Paroxysmal atrial fibrillation: Secondary | ICD-10-CM | POA: Diagnosis not present

## 2019-01-14 LAB — CBC
HCT: 38.6 % (ref 38.5–50.0)
Hemoglobin: 12.9 g/dL — ABNORMAL LOW (ref 13.2–17.1)
MCH: 31.6 pg (ref 27.0–33.0)
MCHC: 33.4 g/dL (ref 32.0–36.0)
MCV: 94.6 fL (ref 80.0–100.0)
MPV: 10.3 fL (ref 7.5–12.5)
Platelets: 306 10*3/uL (ref 140–400)
RBC: 4.08 10*6/uL — ABNORMAL LOW (ref 4.20–5.80)
RDW: 11.9 % (ref 11.0–15.0)
WBC: 11.8 10*3/uL — ABNORMAL HIGH (ref 3.8–10.8)

## 2019-01-14 LAB — COMPLETE METABOLIC PANEL WITH GFR
AG Ratio: 1.5 (calc) (ref 1.0–2.5)
ALT: 13 U/L (ref 9–46)
AST: 15 U/L (ref 10–35)
Albumin: 3.7 g/dL (ref 3.6–5.1)
Alkaline phosphatase (APISO): 110 U/L (ref 35–144)
BUN/Creatinine Ratio: 18 (calc) (ref 6–22)
BUN: 24 mg/dL (ref 7–25)
CO2: 26 mmol/L (ref 20–32)
Calcium: 8.9 mg/dL (ref 8.6–10.3)
Chloride: 105 mmol/L (ref 98–110)
Creat: 1.36 mg/dL — ABNORMAL HIGH (ref 0.70–1.11)
GFR, Est African American: 55 mL/min/{1.73_m2} — ABNORMAL LOW (ref >=60)
GFR, Est Non African American: 48 mL/min/{1.73_m2} — ABNORMAL LOW (ref >=60)
Globulin: 2.5 g/dL (calc) (ref 1.9–3.7)
Glucose, Bld: 109 mg/dL — ABNORMAL HIGH (ref 65–99)
Potassium: 4.8 mmol/L (ref 3.5–5.3)
Sodium: 138 mmol/L (ref 135–146)
Total Bilirubin: 0.3 mg/dL (ref 0.2–1.2)
Total Protein: 6.2 g/dL (ref 6.1–8.1)

## 2019-01-14 LAB — IRON,TIBC AND FERRITIN PANEL
%SAT: 21 % (calc) (ref 20–48)
Ferritin: 15 ng/mL — ABNORMAL LOW (ref 24–380)
Iron: 61 ug/dL (ref 50–180)
TIBC: 293 mcg/dL (calc) (ref 250–425)

## 2019-01-14 LAB — PSA: PSA: 0.6 ng/mL (ref <=4.0)

## 2019-01-14 NOTE — Addendum Note (Signed)
Addended by: Gregor Hams on: 01/14/2019 06:51 AM   Modules accepted: Orders

## 2019-01-16 ENCOUNTER — Encounter: Payer: Self-pay | Admitting: Family Medicine

## 2019-01-27 ENCOUNTER — Telehealth: Payer: Self-pay

## 2019-01-27 DIAGNOSIS — Z1211 Encounter for screening for malignant neoplasm of colon: Secondary | ICD-10-CM

## 2019-01-27 NOTE — Telephone Encounter (Signed)
Called [employer], they did not receive order. I have re-placed and faxed.

## 2019-01-27 NOTE — Telephone Encounter (Signed)
Sent pt MyChart msg advising this has been taken care of. Gave him number to reach out to [employer] if he does not hear from them in next couple days.

## 2019-01-27 NOTE — Telephone Encounter (Signed)
-----  Message from Gregor Hams, MD sent at 01/22/2019  7:00 AM EDT ----- Regarding: Follow up [employer] Patient sent me a message saying that he has not received his [employer]olog[employer] kit yet.  Can we double check on this?  Can we make sure it was actually ordered on the 26th?  Thanks, Ellard Artis

## 2019-02-02 DIAGNOSIS — Z1211 Encounter for screening for malignant neoplasm of colon: Secondary | ICD-10-CM | POA: Diagnosis not present

## 2019-02-08 LAB — COLOGUARD: Cologuard: NEGATIVE

## 2019-02-09 ENCOUNTER — Other Ambulatory Visit: Payer: Self-pay | Admitting: Family Medicine

## 2019-02-09 DIAGNOSIS — N183 Chronic kidney disease, stage 3 unspecified: Secondary | ICD-10-CM

## 2019-02-09 DIAGNOSIS — I25810 Atherosclerosis of coronary artery bypass graft(s) without angina pectoris: Secondary | ICD-10-CM

## 2019-02-13 ENCOUNTER — Encounter: Payer: Self-pay | Admitting: Family Medicine

## 2019-03-17 ENCOUNTER — Telehealth: Payer: Self-pay | Admitting: Family Medicine

## 2019-03-17 DIAGNOSIS — N184 Chronic kidney disease, stage 4 (severe): Secondary | ICD-10-CM

## 2019-03-17 DIAGNOSIS — D500 Iron deficiency anemia secondary to blood loss (chronic): Secondary | ICD-10-CM

## 2019-03-17 NOTE — Telephone Encounter (Signed)
Get nonfasting anemia labs now.  You should have been taking oral iron for about 2 months.  We will see if it improved any.

## 2019-03-17 NOTE — Telephone Encounter (Signed)
Spoke to patient gave him advise as noted below. Rhonda Cunningham,CMA

## 2019-03-17 NOTE — Telephone Encounter (Signed)
-----   Message from Gregor Hams, MD sent at 01/14/2019  6:51 AM EDT ----- Regarding: Recheck labs Recheck anemia labs 2 months after starting oral iron.  Also make sure patient sent in the Hemoccult cards.

## 2019-03-18 ENCOUNTER — Other Ambulatory Visit: Payer: Self-pay | Admitting: Family Medicine

## 2019-03-18 ENCOUNTER — Ambulatory Visit: Payer: Medicare Other | Admitting: Cardiology

## 2019-03-18 DIAGNOSIS — D500 Iron deficiency anemia secondary to blood loss (chronic): Secondary | ICD-10-CM | POA: Diagnosis not present

## 2019-03-18 DIAGNOSIS — N184 Chronic kidney disease, stage 4 (severe): Secondary | ICD-10-CM | POA: Diagnosis not present

## 2019-03-19 LAB — COMPLETE METABOLIC PANEL WITH GFR
AG Ratio: 1.8 (calc) (ref 1.0–2.5)
ALT: 19 U/L (ref 9–46)
AST: 15 U/L (ref 10–35)
Albumin: 3.9 g/dL (ref 3.6–5.1)
Alkaline phosphatase (APISO): 77 U/L (ref 35–144)
BUN/Creatinine Ratio: 23 (calc) — ABNORMAL HIGH (ref 6–22)
BUN: 32 mg/dL — ABNORMAL HIGH (ref 7–25)
CO2: 26 mmol/L (ref 20–32)
Calcium: 8.6 mg/dL (ref 8.6–10.3)
Chloride: 105 mmol/L (ref 98–110)
Creat: 1.42 mg/dL — ABNORMAL HIGH (ref 0.70–1.11)
GFR, Est African American: 52 mL/min/{1.73_m2} — ABNORMAL LOW (ref >=60)
GFR, Est Non African American: 45 mL/min/{1.73_m2} — ABNORMAL LOW (ref >=60)
Globulin: 2.2 g/dL (calc) (ref 1.9–3.7)
Glucose, Bld: 97 mg/dL (ref 65–99)
Potassium: 4.5 mmol/L (ref 3.5–5.3)
Sodium: 139 mmol/L (ref 135–146)
Total Bilirubin: 0.3 mg/dL (ref 0.2–1.2)
Total Protein: 6.1 g/dL (ref 6.1–8.1)

## 2019-03-19 LAB — IRON,TIBC AND FERRITIN PANEL
%SAT: 30 % (calc) (ref 20–48)
Ferritin: 22 ng/mL — ABNORMAL LOW (ref 24–380)
Iron: 89 ug/dL (ref 50–180)
TIBC: 298 mcg/dL (calc) (ref 250–425)

## 2019-03-19 LAB — CBC
HCT: 37.8 % — ABNORMAL LOW (ref 38.5–50.0)
Hemoglobin: 12.8 g/dL — ABNORMAL LOW (ref 13.2–17.1)
MCH: 32.3 pg (ref 27.0–33.0)
MCHC: 33.9 g/dL (ref 32.0–36.0)
MCV: 95.5 fL (ref 80.0–100.0)
MPV: 10.5 fL (ref 7.5–12.5)
Platelets: 244 10*3/uL (ref 140–400)
RBC: 3.96 10*6/uL — ABNORMAL LOW (ref 4.20–5.80)
RDW: 13 % (ref 11.0–15.0)
WBC: 9.6 10*3/uL (ref 3.8–10.8)

## 2019-04-05 ENCOUNTER — Other Ambulatory Visit: Payer: Self-pay | Admitting: Family Medicine

## 2019-04-13 ENCOUNTER — Encounter: Payer: Self-pay | Admitting: Family Medicine

## 2019-04-15 ENCOUNTER — Other Ambulatory Visit: Payer: Self-pay | Admitting: Family Medicine

## 2019-04-15 ENCOUNTER — Other Ambulatory Visit: Payer: Self-pay | Admitting: Cardiology

## 2019-04-15 DIAGNOSIS — I2510 Atherosclerotic heart disease of native coronary artery without angina pectoris: Secondary | ICD-10-CM

## 2019-04-15 DIAGNOSIS — N410 Acute prostatitis: Secondary | ICD-10-CM

## 2019-04-17 ENCOUNTER — Other Ambulatory Visit: Payer: Self-pay | Admitting: Cardiology

## 2019-04-18 ENCOUNTER — Other Ambulatory Visit: Payer: Self-pay | Admitting: Cardiology

## 2019-04-18 DIAGNOSIS — I2510 Atherosclerotic heart disease of native coronary artery without angina pectoris: Secondary | ICD-10-CM

## 2019-04-20 ENCOUNTER — Other Ambulatory Visit: Payer: Self-pay

## 2019-04-22 ENCOUNTER — Ambulatory Visit (INDEPENDENT_AMBULATORY_CARE_PROVIDER_SITE_OTHER): Payer: Medicare Other | Admitting: Physician Assistant

## 2019-04-22 DIAGNOSIS — Z23 Encounter for immunization: Secondary | ICD-10-CM

## 2019-05-04 ENCOUNTER — Encounter: Payer: Self-pay | Admitting: Family Medicine

## 2019-05-04 ENCOUNTER — Ambulatory Visit (INDEPENDENT_AMBULATORY_CARE_PROVIDER_SITE_OTHER): Payer: Medicare Other | Admitting: Family Medicine

## 2019-05-04 ENCOUNTER — Other Ambulatory Visit: Payer: Self-pay

## 2019-05-04 VITALS — BP 134/65 | HR 87 | Temp 98.1°F | Wt 156.0 lb

## 2019-05-04 DIAGNOSIS — I255 Ischemic cardiomyopathy: Secondary | ICD-10-CM

## 2019-05-04 DIAGNOSIS — H6523 Chronic serous otitis media, bilateral: Secondary | ICD-10-CM | POA: Diagnosis not present

## 2019-05-04 DIAGNOSIS — I5022 Chronic systolic (congestive) heart failure: Secondary | ICD-10-CM

## 2019-05-04 DIAGNOSIS — D500 Iron deficiency anemia secondary to blood loss (chronic): Secondary | ICD-10-CM | POA: Insufficient documentation

## 2019-05-04 DIAGNOSIS — I25810 Atherosclerosis of coronary artery bypass graft(s) without angina pectoris: Secondary | ICD-10-CM

## 2019-05-04 DIAGNOSIS — I10 Essential (primary) hypertension: Secondary | ICD-10-CM

## 2019-05-04 DIAGNOSIS — E785 Hyperlipidemia, unspecified: Secondary | ICD-10-CM

## 2019-05-04 DIAGNOSIS — I739 Peripheral vascular disease, unspecified: Secondary | ICD-10-CM | POA: Diagnosis not present

## 2019-05-04 DIAGNOSIS — N184 Chronic kidney disease, stage 4 (severe): Secondary | ICD-10-CM | POA: Diagnosis not present

## 2019-05-04 MED ORDER — FUROSEMIDE 40 MG PO TABS: 40.0000 mg | ORAL_TABLET | Freq: Every day | ORAL | 2 refills | Status: DC

## 2019-05-04 MED ORDER — LISINOPRIL 10 MG PO TABS: 10.0000 mg | ORAL_TABLET | Freq: Every day | ORAL | 2 refills | Status: DC

## 2019-05-04 MED ORDER — CEFDINIR 300 MG PO CAPS: 300.0000 mg | ORAL_CAPSULE | Freq: Two times a day (BID) | ORAL | 0 refills | Status: DC

## 2019-05-04 MED ORDER — PREDNISONE 10 MG PO TABS: 30.0000 mg | ORAL_TABLET | Freq: Every day | ORAL | 0 refills | Status: DC

## 2019-05-04 NOTE — Patient Instructions (Signed)
Thank you for coming in today. Take the prednisone and omnicef for your ear Get fasting labs after Nov 5th.  Recheck with Dr Zigmund Daniel in about 4-5 months.  We will be here if something comes up before.    I will be moving to full time Sports Medicine in [place] starting on November 1st.  You will still be able to see me for your Sports Medicine or Orthopedic needs at [facility] in [place]. I will still be part of [place].    If you want to stay locally for your Sports Medicine issues Dr. Dianah Field here in [place] will be happy to see you.  Additionally Dr. Clearance Coots at [facility] will be happy to see you for sports medicine issues more locally.   For your primary care needs you are welcome to establish care with Dr. Emeterio Reeve.  Dr Luetta Nutting will be starting in the new year and a new NP Caryl Asp will be starting in December.  We are working quickly to hire more physicians to cover the primary care needs however if you cannot get an appointment with Dr. Sheppard Coil in a timely manner [facility] has locations and openings for primary care services nearby.   [facility] at [facility] [address] . [employer] , [place]: [phone] . Behavioral Medicine: [phone] . Fax: [place] at [employer] [address] . [place], [place]: [phone] . Behavioral Medicine: [phone] . Fax: [phone] . Hours (M-F): 7am - Academic librarian At [facility]. [place], [place]: [phone] . Behavioral Medicine: [phone] . Fax: [phone] . Hours (M-F): 8am - Optician, dispensing at [employer] . [place], [place] Phone: [phone] . Behavioral Medicine: [phone] . Fax: [phone]

## 2019-05-04 NOTE — Progress Notes (Signed)
Bradley Stanley is a 85 y.o. male who presents to [facility]: [facility] today for ear pressure, follow-up hypertension CKD and anemia.  Patient has a history of bilateral left worse than right ear pressure.  He has been told in the past that he would benefit from ear tubes.  He notes popping and pressure and ringing in his ears.  He notes it has been worse recently.  He takes allergy medication daily.  This helps some.  No discharge no fevers or chills.  Additionally he has a history of CKD and hypertension.  He takes medications listed below.  No chest pain palpitations lightheadedness or dizziness.    He has a history of iron deficiency anemia of the likely was secondary to gastric ulcers.  These have been much better controlled and overall he is doing a lot better.  He had a little bit of iron deficiency when last checked about 2 months ago and has been taking iron supplementation since.    ROS as above:  Exam:  BP 134/65   Pulse 87   Temp 98.1 F (36.7 C) (Oral)   Wt 156 lb (70.8 kg)   BMI 23.04 kg/m  Wt Readings from Last 5 Encounters:  05/04/19 156 lb (70.8 kg)  01/06/19 156 lb (70.8 kg)  05/23/18 154 lb (69.9 kg)  11/19/17 153 lb (69.4 kg)  10/16/17 156 lb 12.8 oz (71.1 kg)    Gen: Well NAD HEENT: EOMI,  MMM left tympanic membrane with serous effusion with no erythema.  Right tympanic membrane similar. Lungs: Normal work of breathing. CTABL Heart: RRR no MRG Abd: NABS, Soft. Nondistended, Nontender Exts: Brisk capillary refill, warm and well perfused.   Lab and Radiology Results Recent Results (from the past 2160 hour(s))  Cologuard     Status: None   Collection Time: 02/08/19 12:00 AM  Result Value Ref Range   Cologuard Negative Negative  CBC     Status: Abnormal   Collection Time: 03/18/19  2:30 PM  Result Value Ref Range   WBC 9.6 3.8 - 10.8 Thousand/uL    RBC 3.96 (L) 4.20 - 5.80 Million/uL   Hemoglobin 12.8 (L) 13.2 - 17.1 g/dL   HCT 37.8 (L) 38.5 - 50.0 %   MCV 95.5 80.0 - 100.0 fL   MCH 32.3 27.0 - 33.0 pg   MCHC 33.9 32.0 - 36.0 g/dL   RDW 13.0 11.0 - 15.0 %   Platelets 244 140 - 400 Thousand/uL   MPV 10.5 7.5 - 12.5 fL  COMPLETE METABOLIC PANEL WITH GFR     Status: Abnormal   Collection Time: 03/18/19  2:30 PM  Result Value Ref Range   Glucose, Bld 97 65 - 99 mg/dL    Comment: .            Fasting reference interval .    BUN 32 (H) 7 - 25 mg/dL   Creat 1.42 (H) 0.70 - 1.11 mg/dL    Comment: For patients >45 years of age, the reference limit for Creatinine is approximately 13% higher for people identified as African-American. .    GFR, Est Non African American 45 (L) > OR = 60 mL/min/1.50m2   GFR, Est African American 52 (L) > OR = 60 mL/min/1.48m2   BUN/Creatinine Ratio 23 (H) 6 - 22 (calc)   Sodium 139 135 - 146 mmol/L   Potassium 4.5 3.5 - 5.3 mmol/L   Chloride 105 98 - 110  mmol/L   CO2 26 20 - 32 mmol/L   Calcium 8.6 8.6 - 10.3 mg/dL   Total Protein 6.1 6.1 - 8.1 g/dL   Albumin 3.9 3.6 - 5.1 g/dL   Globulin 2.2 1.9 - 3.7 g/dL (calc)   AG Ratio 1.8 1.0 - 2.5 (calc)   Total Bilirubin 0.3 0.2 - 1.2 mg/dL   Alkaline phosphatase (APISO) 77 35 - 144 U/L   AST 15 10 - 35 U/L   ALT 19 9 - 46 U/L  Iron, TIBC and Ferritin Panel     Status: Abnormal   Collection Time: 03/18/19  2:30 PM  Result Value Ref Range   Iron 89 50 - 180 mcg/dL   TIBC 298 250 - 425 mcg/dL (calc)   %SAT 30 20 - 48 % (calc)   Ferritin 22 (L) 24 - 380 ng/mL      Assessment and Plan: 85 y.o. male with  Ear pressure: Serous effusion.  Treat with Omnicef and prednisone.  Recheck if not improving.  Hypertension and heart disease.  Doing well continue current regimen.  Hyperlipidemia check fasting lipids near future.  CKD: Check metabolic panel in near future with rest of labs.  Iron deficiency anemia: Plan to recheck CBC and iron stores with  fasting labs in about a month.  Recheck new PCP 4 to 5 months.  PDMP not reviewed this encounter. Orders Placed This Encounter  Procedures  . CBC  . COMPLETE METABOLIC PANEL WITH GFR  . Iron, TIBC and Ferritin Panel  . Lipid Panel w/reflex Direct LDL   Meds ordered this encounter  Medications  . furosemide (LASIX) 40 MG tablet    Sig: Take 1 tablet (40 mg total) by mouth daily.    Dispense:  90 tablet    Refill:  2  . lisinopril (ZESTRIL) 10 MG tablet    Sig: Take 1 tablet (10 mg total) by mouth daily.    Dispense:  90 tablet    Refill:  2  . predniSONE (DELTASONE) 10 MG tablet    Sig: Take 3 tablets (30 mg total) by mouth daily with breakfast.    Dispense:  15 tablet    Refill:  0  . cefdinir (OMNICEF) 300 MG capsule    Sig: Take 1 capsule (300 mg total) by mouth 2 (two) times daily.    Dispense:  14 capsule    Refill:  0     Historical information moved to improve visibility of documentation.  Past Medical History:  Diagnosis Date  . A-fib ([place]) 04/2015   after CABG.  Abixiban initiated.   . Acute blood loss anemia   . Anxiety    "occasionally" (11/03/2015)  . BPH (benign prostatic hyperplasia)   . CAD (coronary artery disease)   . Chronic bronchitis ([place])   . GERD (gastroesophageal reflux disease)   . Heart murmur   . High cholesterol   . Hypertension   . Myocardial infarction ([place]) 04/2015  . Pneumonia 05/2015   "after heart surgery"  . Pyloric ulcer   . Renal insufficiency    Past Surgical History:  Procedure Laterality Date  . CATARACT EXTRACTION W/ INTRAOCULAR LENS  IMPLANT, BILATERAL    . CORONARY ARTERY BYPASS GRAFT  04/2015   CABG "X4; [place]"  . ESOPHAGOGASTRODUODENOSCOPY N/A 11/06/2015   Procedure: ESOPHAGOGASTRODUODENOSCOPY (EGD);  Surgeon: Jerene Bears, MD;  [facility] END[facility]COPY;  Service: Endoscopy;  Laterality: N/A;  . HAND RECONSTRUCTION Bilateral 1970s   "tore up by garage door  spring"  . INGUINAL HERNIA REPAIR Bilateral     Social History   Tobacco Use  . Smoking status: Current Some Day Smoker    Packs/day: 0.50    Years: 65.00    Pack years: 32.50    Types: Cigarettes  . Smokeless tobacco: Never Used  Substance Use Topics  . Alcohol use: Yes    Alcohol/week: 0.0 standard drinks    Comment: 11/03/2015 "not drinking anything now"   family history includes CAD in his father; Diabetes in his brother and sister.  Medications: Current Outpatient Medications  Medication Sig Dispense Refill  . aspirin EC 81 MG tablet Take 1 tablet (81 mg total) by mouth daily.    Marland Kitchen atorvastatin (LIPITOR) 80 MG tablet Take 1 tablet by mouth once daily 90 tablet 0  . Cetirizine HCl 10 MG CAPS Take 10 mg by mouth daily.    . diclofenac sodium (VOLTAREN) 1 % GEL Apply 4 g topically 4 (four) times daily. To affected joint. 100 g 11  . doxazosin (CARDURA) 4 MG tablet Take 1 tablet by mouth once daily 90 tablet 1  . furosemide (LASIX) 40 MG tablet Take 1 tablet (40 mg total) by mouth daily. 90 tablet 2  . ipratropium (ATROVENT) 0.06 % nasal spray Place 2 sprays into both nostrils every 4 (four) hours as needed for rhinitis. 10 mL 6  . lisinopril (ZESTRIL) 10 MG tablet Take 1 tablet (10 mg total) by mouth daily. 90 tablet 2  . LORazepam (ATIVAN) 0.5 MG tablet TAKE 1 TABLET BY MOUTH EVERY 8 HOURS AS NEEDED FOR ANXIETY 30 tablet 5  . metoprolol succinate (TOPROL-XL) 50 MG 24 hr tablet Take 1 tablet by mouth twice daily 180 tablet 1  . montelukast (SINGULAIR) 10 MG tablet Take 1 tablet (10 mg total) by mouth at bedtime. 90 tablet 1  . Multiple Vitamin (MULTIVITAMIN) tablet Take 1 tablet by mouth daily.    Marland Kitchen nystatin (MYCOSTATIN) 100000 UNIT/ML suspension Take 5 mLs (500,000 Units total) by mouth 4 (four) times daily. Swish for 30 seconds and swallow. 180 mL 12  . pantoprazole (PROTONIX) 40 MG tablet Take 1 tablet by mouth twice daily 180 tablet 0  . PROAIR HFA 108 (90 Base) MCG/ACT inhaler INHALE 2 PUFFS INTO LUNGS EVERY 6 HOURS AS  NEEDED FOR WHEEZING OR SHORTNESS OF BREATH 27 each 3  . sucralfate (CARAFATE) 1 g tablet Take 1 tablet by mouth 4 times daily 360 tablet 0  . tamsulosin (FLOMAX) 0.4 MG CAPS capsule Take 1 capsule by mouth once daily 90 capsule 1  . umeclidinium-vilanterol (ANORO ELLIPTA) 62.5-25 MCG/INH AEPB Inhale 1 puff into the lungs daily. 90 each 5  . cefdinir (OMNICEF) 300 MG capsule Take 1 capsule (300 mg total) by mouth 2 (two) times daily. 14 capsule 0  . predniSONE (DELTASONE) 10 MG tablet Take 3 tablets (30 mg total) by mouth daily with breakfast. 15 tablet 0   No current facility-administered medications for this visit.    No Known Allergies   Discussed warning signs or symptoms. Please see discharge instructions. Patient expresses understanding.

## 2019-06-09 DIAGNOSIS — I739 Peripheral vascular disease, unspecified: Secondary | ICD-10-CM | POA: Diagnosis not present

## 2019-06-09 DIAGNOSIS — I10 Essential (primary) hypertension: Secondary | ICD-10-CM | POA: Diagnosis not present

## 2019-06-09 DIAGNOSIS — N184 Chronic kidney disease, stage 4 (severe): Secondary | ICD-10-CM | POA: Diagnosis not present

## 2019-06-09 DIAGNOSIS — I25810 Atherosclerosis of coronary artery bypass graft(s) without angina pectoris: Secondary | ICD-10-CM | POA: Diagnosis not present

## 2019-06-09 DIAGNOSIS — E785 Hyperlipidemia, unspecified: Secondary | ICD-10-CM | POA: Diagnosis not present

## 2019-06-09 DIAGNOSIS — N183 Chronic kidney disease, stage 3 unspecified: Secondary | ICD-10-CM | POA: Diagnosis not present

## 2019-06-09 DIAGNOSIS — D500 Iron deficiency anemia secondary to blood loss (chronic): Secondary | ICD-10-CM | POA: Diagnosis not present

## 2019-06-09 DIAGNOSIS — H6523 Chronic serous otitis media, bilateral: Secondary | ICD-10-CM | POA: Diagnosis not present

## 2019-06-10 DIAGNOSIS — L570 Actinic keratosis: Secondary | ICD-10-CM | POA: Diagnosis not present

## 2019-06-10 DIAGNOSIS — B351 Tinea unguium: Secondary | ICD-10-CM | POA: Diagnosis not present

## 2019-06-10 DIAGNOSIS — L578 Other skin changes due to chronic exposure to nonionizing radiation: Secondary | ICD-10-CM | POA: Diagnosis not present

## 2019-06-10 LAB — COMPLETE METABOLIC PANEL WITH GFR
AG Ratio: 1.7 (calc) (ref 1.0–2.5)
ALT: 18 U/L (ref 9–46)
AST: 17 U/L (ref 10–35)
Albumin: 3.8 g/dL (ref 3.6–5.1)
Alkaline phosphatase (APISO): 76 U/L (ref 35–144)
BUN/Creatinine Ratio: 15 (calc) (ref 6–22)
BUN: 21 mg/dL (ref 7–25)
CO2: 25 mmol/L (ref 20–32)
Calcium: 8.5 mg/dL — ABNORMAL LOW (ref 8.6–10.3)
Chloride: 106 mmol/L (ref 98–110)
Creat: 1.38 mg/dL — ABNORMAL HIGH (ref 0.70–1.11)
GFR, Est African American: 54 mL/min/{1.73_m2} — ABNORMAL LOW (ref >=60)
GFR, Est Non African American: 47 mL/min/{1.73_m2} — ABNORMAL LOW (ref >=60)
Globulin: 2.2 g/dL (calc) (ref 1.9–3.7)
Glucose, Bld: 108 mg/dL — ABNORMAL HIGH (ref 65–99)
Potassium: 4.7 mmol/L (ref 3.5–5.3)
Sodium: 139 mmol/L (ref 135–146)
Total Bilirubin: 0.4 mg/dL (ref 0.2–1.2)
Total Protein: 6 g/dL — ABNORMAL LOW (ref 6.1–8.1)

## 2019-06-10 LAB — LIPID PANEL W/REFLEX DIRECT LDL
Cholesterol: 110 mg/dL (ref <200)
HDL: 38 mg/dL — ABNORMAL LOW (ref >=40)
LDL Cholesterol (Calc): 56 mg/dL (calc)
Non-HDL Cholesterol (Calc): 72 mg/dL (calc) (ref <130)
Total CHOL/HDL Ratio: 2.9 (calc) (ref <5.0)
Triglycerides: 79 mg/dL (ref <150)

## 2019-06-10 LAB — CBC
HCT: 37.9 % — ABNORMAL LOW (ref 38.5–50.0)
Hemoglobin: 12.8 g/dL — ABNORMAL LOW (ref 13.2–17.1)
MCH: 32 pg (ref 27.0–33.0)
MCHC: 33.8 g/dL (ref 32.0–36.0)
MCV: 94.8 fL (ref 80.0–100.0)
MPV: 10.1 fL (ref 7.5–12.5)
Platelets: 260 10*3/uL (ref 140–400)
RBC: 4 10*6/uL — ABNORMAL LOW (ref 4.20–5.80)
RDW: 12.6 % (ref 11.0–15.0)
WBC: 9.4 10*3/uL (ref 3.8–10.8)

## 2019-06-10 LAB — IRON,TIBC AND FERRITIN PANEL
%SAT: 31 % (calc) (ref 20–48)
Ferritin: 32 ng/mL (ref 24–380)
Iron: 86 ug/dL (ref 50–180)
TIBC: 278 mcg/dL (calc) (ref 250–425)

## 2019-07-02 ENCOUNTER — Other Ambulatory Visit: Payer: Self-pay | Admitting: Family Medicine

## 2019-07-14 ENCOUNTER — Other Ambulatory Visit: Payer: Self-pay | Admitting: Family Medicine

## 2019-07-14 ENCOUNTER — Other Ambulatory Visit: Payer: Self-pay | Admitting: Cardiology

## 2019-07-14 ENCOUNTER — Telehealth: Payer: Self-pay

## 2019-07-14 DIAGNOSIS — I2510 Atherosclerotic heart disease of native coronary artery without angina pectoris: Secondary | ICD-10-CM

## 2019-07-14 NOTE — Telephone Encounter (Signed)
Called patient to schedule an appointment with Dr. Stanford Breed for medication Refill since he has not had an office visit since 09/2017

## 2019-08-01 ENCOUNTER — Ambulatory Visit: Payer: Medicare Other | Attending: Internal Medicine

## 2019-08-01 DIAGNOSIS — Z23 Encounter for immunization: Secondary | ICD-10-CM

## 2019-08-01 NOTE — Progress Notes (Signed)
   Covid-19 Vaccination Clinic  Name:  Bradley Stanley    MRN: [id number] DOB: [birth year]  08/01/2019  [name] was observed post Covid-19 immunization for 15 minutes without incidence. He was provided with Vaccine Information Sheet and instruction to access the V-Safe system.   [name] was instructed to call 911 with any severe reactions post vaccine: Marland Kitchen Difficulty breathing  . Swelling of your face and throat  . A fast heartbeat  . A bad rash all over your body  . Dizziness and weakness    Immunizations Administered    Name Date Dose VIS Date Route   Pfizer COVID-19 Vaccine 08/01/2019  1:36 PM 0.3 mL 07/03/2019 Intramuscular   Manufacturer: [employer], [employer]   Lot: [id number]   [place]: [id number]

## 2019-08-21 ENCOUNTER — Ambulatory Visit: Payer: Medicare Other

## 2019-08-22 ENCOUNTER — Ambulatory Visit: Payer: Medicare Other | Attending: Internal Medicine

## 2019-08-22 DIAGNOSIS — Z23 Encounter for immunization: Secondary | ICD-10-CM | POA: Insufficient documentation

## 2019-08-22 NOTE — Progress Notes (Signed)
   Covid-19 Vaccination Clinic  Name:  Kaylum Shrum    MRN: [id number] DOB: 1933/06/20  08/22/2019  Mr. Shelton was observed post Covid-19 immunization for 15 minutes without incidence. He was provided with Vaccine Information Sheet and instruction to access the V-Safe system.   Mr. Carlberg was instructed to call 911 with any severe reactions post vaccine: Marland Kitchen Difficulty breathing  . Swelling of your face and throat  . A fast heartbeat  . A bad rash all over your body  . Dizziness and weakness    Immunizations Administered    Name Date Dose VIS Date Route   Pfizer COVID-19 Vaccine 08/22/2019 11:32 AM 0.3 mL 07/03/2019 Intramuscular   Manufacturer: [place]   Lot: [id number]   [place]: [id number]

## 2019-08-25 ENCOUNTER — Ambulatory Visit: Payer: Medicare Other | Admitting: Family Medicine

## 2019-09-21 ENCOUNTER — Other Ambulatory Visit: Payer: Self-pay | Admitting: Family Medicine

## 2019-09-22 NOTE — Progress Notes (Signed)
HPI: FU coronary artery disease. Patient had an anteroseptal myocardial infarction in October 7782 complicated by cardiogenic shock. Cared for in [place]. He underwent emergent coronary artery bypass graft for left main disease and an occluded LAD. He had grafting of his LAD and ramus intermedius. His right coronary artery was occluded with left collaterals and was not grafted. There was a circumflex that rose anomalously from the right coronary cusp and had what was described as modest disease and was not grafted. Echocardiogram May 2017 showed ejection fraction 35-40% with akinesis of the anterior, septal, apical and inferior apical walls. 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 Follow-up recommended in 5 years. Patient did have postoperative atrial fibrillation following his bypass surgery.He was having occasional palpitations at last office visit. Event monitor October 2017 showed sinus rhythm with PACs and PVCs.Since last seen,there is no chest pain or syncope.  Occasional palpitations that are brief.  He notes some increased dyspnea on exertion but no orthopnea, PND or pedal edema.  No syncope or bleeding.  Current Outpatient Medications  Medication Sig Dispense Refill  . aspirin EC 81 MG tablet Take 1 tablet (81 mg total) by mouth daily.    Marland Kitchen atorvastatin (LIPITOR) 80 MG tablet TAKE 1 TABLET BY MOUTH ONCE DAILY . APPOINTMENT REQUIRED FOR FUTURE REFILLS 90 tablet 0  . Cetirizine HCl 10 MG CAPS Take 10 mg by mouth daily.    Marland Kitchen doxazosin (CARDURA) 4 MG tablet Take 1 tablet (4 mg total) by mouth daily. 90 tablet 1  . furosemide (LASIX) 40 MG tablet Take 1 tablet (40 mg total) by mouth daily. 90 tablet 2  . ipratropium (ATROVENT) 0.06 % nasal spray Place 2 sprays into both nostrils every 4 (four) hours as needed for rhinitis. 10 mL 6  . lisinopril (ZESTRIL) 10 MG tablet Take 1 tablet (10 mg total) by mouth daily. 90 tablet 2  . LORazepam (ATIVAN) 0.5 MG tablet TAKE 1  TABLET BY MOUTH EVERY 8 HOURS AS NEEDED FOR ANXIETY 30 tablet 5  . metoprolol succinate (TOPROL-XL) 50 MG 24 hr tablet Take 1 tablet by mouth twice daily 180 tablet 1  . montelukast (SINGULAIR) 10 MG tablet Take 1 tablet (10 mg total) by mouth at bedtime. 90 tablet 2  . Multiple Vitamin (MULTIVITAMIN) tablet Take 1 tablet by mouth daily.    . pantoprazole (PROTONIX) 40 MG tablet Take 1 tablet (40 mg total) by mouth 2 (two) times daily. 180 tablet 1  . sucralfate (CARAFATE) 1 g tablet Take 1 tablet by mouth 4 times daily 360 tablet 0  . tamsulosin (FLOMAX) 0.4 MG CAPS capsule Take 1 capsule (0.4 mg total) by mouth daily. 90 capsule 1  . umeclidinium-vilanterol (ANORO ELLIPTA) 62.5-25 MCG/INH AEPB Inhale 1 puff into the lungs daily.     No current facility-administered medications for this visit.     Past Medical History:  Diagnosis Date  . A-fib ([place]) 04/2015   after CABG.  Abixiban initiated.   . Acute blood loss anemia   . Anxiety    "occasionally" (11/03/2015)  . BPH (benign prostatic hyperplasia)   . CAD (coronary artery disease)   . Chronic bronchitis ([place])   . GERD (gastroesophageal reflux disease)   . Heart murmur   . High cholesterol   . Hypertension   . Myocardial infarction ([place]) 04/2015  . Pneumonia 05/2015   "after heart surgery"  . Pyloric ulcer   . Renal insufficiency  Past Surgical History:  Procedure Laterality Date  . CATARACT EXTRACTION W/ INTRAOCULAR LENS  IMPLANT, BILATERAL    . CORONARY ARTERY BYPASS GRAFT  04/2015   CABG "X4; [place]"  . ESOPHAGOGASTRODUODENOSCOPY N/A 11/06/2015   Procedure: ESOPHAGOGASTRODUODENOSCOPY (EGD);  Surgeon: Jerene Bears, MD;  Location: [facility] ENDOSCOPY;  Service: Endoscopy;  Laterality: N/A;  . HAND RECONSTRUCTION Bilateral 1970s   "tore up by garage door spring"  . INGUINAL HERNIA REPAIR Bilateral     Social History   Socioeconomic History  . Marital status: Married    Spouse name: Not on file  . Number of children:  3  . Years of education: Not on file  . Highest education level: Not on file  Occupational History  . Not on file  Tobacco Use  . Smoking status: Current Some Day Smoker    Packs/day: 0.50    Years: 65.00    Pack years: 32.50    Types: Cigarettes  . Smokeless tobacco: Never Used  Substance and Sexual Activity  . Alcohol use: Yes    Alcohol/week: 0.0 standard drinks    Comment: 11/03/2015 "not drinking anything now"  . Drug use: No  . Sexual activity: Never  Other Topics Concern  . Not on file  Social History Narrative  . Not on file   Social Determinants of Health   Financial Resource Strain:   . Difficulty of Paying Living Expenses: Not on file  Food Insecurity:   . Worried About Charity fundraiser in the Last Year: Not on file  . Ran Out of Food in the Last Year: Not on file  Transportation Needs:   . Lack of Transportation (Medical): Not on file  . Lack of Transportation (Non-Medical): Not on file  Physical Activity:   . Days of Exercise per Week: Not on file  . Minutes of Exercise per Session: Not on file  Stress:   . Feeling of Stress : Not on file  Social Connections:   . Frequency of Communication with Friends and Family: Not on file  . Frequency of Social Gatherings with Friends and Family: Not on file  . Attends Religious Services: Not on file  . Active Member of Clubs or Organizations: Not on file  . Attends Archivist Meetings: Not on file  . Marital Status: Not on file  Intimate Partner Violence:   . Fear of Current or Ex-Partner: Not on file  . Emotionally Abused: Not on file  . Physically Abused: Not on file  . Sexually Abused: Not on file    Family History  Problem Relation Age of Onset  . CAD Father   . Diabetes Sister   . Diabetes Brother     ROS: no fevers or chills, productive cough, hemoptysis, dysphasia, odynophagia, melena, hematochezia, dysuria, hematuria, rash, seizure activity, orthopnea, PND, pedal edema, claudication.  Remaining systems are negative.  Physical Exam: Well-developed well-nourished in no acute distress.  Skin is warm and dry.  HEENT is normal.  Neck is supple.  Chest is clear to auscultation with normal expansion.  Cardiovascular exam is irregular, 2/6 systolic murmur left sternal border. Abdominal exam nontender or distended. No masses palpated. Extremities show no edema. neuro grossly intact  ECG-atrial fibrillation at a rate of 64, normal axis, cannot rule out septal infarct, lateral T wave inversion.  Personally reviewed  A/P  1 coronary artery disease status post coronary artery bypass and graft-patient denies chest pain.  Plan to continue medical therapy with statin.  Discontinue aspirin as he will need apixaban for newly diagnosed atrial fibrillation.  2 hypertension-blood pressure controlled.  Continue present medical regimen.  3 hyperlipidemia-continue statin.  Check lipids and liver.  4 ischemic cardiomyopathy-continue beta-blocker and ACE inhibitor.  5 chronic systolic congestive heart failure-euvolemic on examination.  Continue Lasix at present dose.  6 tobacco abuse-patient counseled on discontinuing.  7 peripheral vascular disease-patient noted to have an ectatic aorta on previous ultrasound.  Plan follow-up study May 2022.  8 new onset atrial fibrillation-patient has newly diagnosed atrial fibrillation.  He had postoperative atrial fibrillation following his bypass surgery but he has not had recurrence until now.  His rate is controlled and we will continue metoprolol.  Discontinue aspirin and add apixaban (we will check renal function before dosing as he has had a borderline creatinine in the past).  Schedule echocardiogram and TSH.  I will also check hemoglobin.  I discussed rate control versus rhythm control today.  He does have some mild increased dyspnea on exertion.  I think it would be worthwhile to proceed with cardioversion to see if this improves his symptoms.   If not we will continue with rate control and anticoagulation in the future.  Kirk Ruths, MD

## 2019-09-23 ENCOUNTER — Ambulatory Visit (INDEPENDENT_AMBULATORY_CARE_PROVIDER_SITE_OTHER): Payer: Medicare Other | Admitting: Family Medicine

## 2019-09-23 ENCOUNTER — Other Ambulatory Visit: Payer: Self-pay

## 2019-09-23 ENCOUNTER — Encounter: Payer: Self-pay | Admitting: Family Medicine

## 2019-09-23 DIAGNOSIS — I10 Essential (primary) hypertension: Secondary | ICD-10-CM | POA: Diagnosis not present

## 2019-09-23 DIAGNOSIS — I25810 Atherosclerosis of coronary artery bypass graft(s) without angina pectoris: Secondary | ICD-10-CM

## 2019-09-23 DIAGNOSIS — I480 Paroxysmal atrial fibrillation: Secondary | ICD-10-CM | POA: Diagnosis not present

## 2019-09-23 DIAGNOSIS — N400 Enlarged prostate without lower urinary tract symptoms: Secondary | ICD-10-CM

## 2019-09-23 DIAGNOSIS — J410 Simple chronic bronchitis: Secondary | ICD-10-CM | POA: Diagnosis not present

## 2019-09-23 DIAGNOSIS — N410 Acute prostatitis: Secondary | ICD-10-CM

## 2019-09-23 DIAGNOSIS — N184 Chronic kidney disease, stage 4 (severe): Secondary | ICD-10-CM

## 2019-09-23 MED ORDER — DOXAZOSIN MESYLATE 4 MG PO TABS: 4.0000 mg | ORAL_TABLET | Freq: Every day | ORAL | 1 refills | Status: DC

## 2019-09-23 MED ORDER — PANTOPRAZOLE SODIUM 40 MG PO TBEC: 40.0000 mg | DELAYED_RELEASE_TABLET | Freq: Two times a day (BID) | ORAL | 1 refills | Status: DC

## 2019-09-23 MED ORDER — MONTELUKAST SODIUM 10 MG PO TABS: 10.0000 mg | ORAL_TABLET | Freq: Every day | ORAL | 2 refills | Status: DC

## 2019-09-23 MED ORDER — TAMSULOSIN HCL 0.4 MG PO CAPS: 0.4000 mg | ORAL_CAPSULE | Freq: Every day | ORAL | 1 refills | Status: DC

## 2019-09-23 NOTE — Patient Instructions (Signed)
Great to meet you today! Please continue current medications.  Let me know if the ears continue to bother you and we'll get you set back up with ENT.  I would like to see you back again in about 6 months

## 2019-09-23 NOTE — Progress Notes (Signed)
Bradley Stanley - 87 y.o. male MRN [id number]  Date of birth: 1932/10/26  Subjective Chief Complaint  Patient presents with  . Hypertension    HPI Bradley Stanley is a 86 y.o. male with history of HTN, PAF, ischemic cardiomyopathy, BPH, CKD 4 and COPD here today for follow up visit.  Labs from 05/2019 visit reviewed.   -HTN:  History of HTN with PAF and ischemic cardiomyopathy.  Current medications include toprol-xl and lasix.  He is also followed by cardiology.  He denies anginal symptoms, headache, vision changes or shortness of breath.   -COPD:  Treated with anoro.  Doing well with this.  He also has albuterol inhaler that he uses PRN.  He does not need refills of this at this time.  He does smoke 0.25-0.5 ppd.    -CKD:  Baseline creatinine of around 1.4, eGFR 47.  He avoids NSAID medications.  LUT's or BPH are well controlled, denies retention.    -Serous OM:  History of chronic serous OM with tinnitus. Has seen ENT and told that he likely needed hearing aids.  Evaluated but cost too much.  Seen at [facility] and told that they didn't think hearing aids would be very helpful.  He is undecided on whether he wants to f/u with ENT.    ROS:  A comprehensive ROS was completed and negative except as noted per HPI  No Known Allergies  Past Medical History:  Diagnosis Date  . A-fib ([place]) 04/2015   after CABG.  Abixiban initiated.   . Acute blood loss anemia   . Anxiety    "occasionally" (11/03/2015)  . BPH (benign prostatic hyperplasia)   . CAD (coronary artery disease)   . Chronic bronchitis ([place])   . GERD (gastroesophageal reflux disease)   . Heart murmur   . High cholesterol   . Hypertension   . Myocardial infarction ([place]) 04/2015  . Pneumonia 05/2015   "after heart surgery"  . Pyloric ulcer   . Renal insufficiency     Past Surgical History:  Procedure Laterality Date  . CATARACT EXTRACTION W/ INTRAOCULAR LENS  IMPLANT, BILATERAL    . CORONARY ARTERY BYPASS GRAFT  04/2015   CABG  "X4; [place]"  . ESOPHAGOGASTRODUODENOSCOPY N/A 11/06/2015   Procedure: ESOPHAGOGASTRODUODENOSCOPY (EGD);  Surgeon: Jerene Bears, MD;  Location[facility] ENDOSCOPY;  Service: Endoscopy;  Laterality: N/A;  . HAND RECONSTRUCTION Bilateral 1970s   "tore up by garage door spring"  . INGUINAL HERNIA REPAIR Bilateral     Social History   Socioeconomic History  . Marital status: Married    Spouse name: Not on file  . Number of children: 3  . Years of education: Not on file  . Highest education level: Not on file  Occupational History  . Not on file  Tobacco Use  . Smoking status: Current Some Day Smoker    Packs/day: 0.50    Years: 65.00    Pack years: 32.50    Types: Cigarettes  . Smokeless tobacco: Never Used  Substance and Sexual Activity  . Alcohol use: Yes    Alcohol/week: 0.0 standard drinks    Comment: 11/03/2015 "not drinking anything now"  . Drug use: No  . Sexual activity: Never  Other Topics Concern  . Not on file  Social History Narrative  . Not on file   Social Determinants of Health   Financial Resource Strain:   . Difficulty of Paying Living Expenses: Not on file  Food Insecurity:   . Worried About  Running Out of Food in the Last Year: Not on file  . Ran Out of Food in the Last Year: Not on file  Transportation Needs:   . Lack of Transportation (Medical): Not on file  . Lack of Transportation (Non-Medical): Not on file  Physical Activity:   . Days of Exercise per Week: Not on file  . Minutes of Exercise per Session: Not on file  Stress:   . Feeling of Stress : Not on file  Social Connections:   . Frequency of Communication with Friends and Family: Not on file  . Frequency of Social Gatherings with Friends and Family: Not on file  . Attends Religious Services: Not on file  . Active Member of Clubs or Organizations: Not on file  . Attends Archivist Meetings: Not on file  . Marital Status: Not on file    Family History  Problem Relation Age of  Onset  . CAD Father   . Diabetes Sister   . Diabetes Brother     Health Maintenance  Topic Date Due  . TETANUS/TDAP  05/22/2027  . INFLUENZA VACCINE  Completed  . PNA vac Low Risk Adult  Completed     ----------------------------------------------------------------------------------------------------------------------------------------------------------------------------------------------------------------- Physical Exam BP 130/68   Pulse 73   Temp 97.9 F (36.6 C) (Oral)   Ht '5\' 9"'  (1.753 m)   Wt 167 lb (75.8 kg)   BMI 24.66 kg/m   Physical Exam Constitutional:      Appearance: Normal appearance.  HENT:     Head: Normocephalic and atraumatic.     Right Ear: Tympanic membrane normal.     Left Ear: Tympanic membrane normal.  Eyes:     General: No scleral icterus. Cardiovascular:     Rate and Rhythm: Normal rate and regular rhythm.  Pulmonary:     Effort: Pulmonary effort is normal.     Breath sounds: Normal breath sounds.  Musculoskeletal:     Cervical back: Neck supple.  Skin:    General: Skin is warm and dry.  Neurological:     General: No focal deficit present.     Mental Status: He is alert.  Psychiatric:        Mood and Affect: Mood normal.        Behavior: Behavior normal.     ------------------------------------------------------------------------------------------------------------------------------------------------------------------------------------------------------------------- Assessment and Plan  Paroxysmal atrial fibrillation (HCC) RRR on exam today.  Denies symptoms related to A. Fib.   Essential hypertension Blood pressure is at goal at for age and co-morbidities.  I recommend he continue toprol with furosemide.  In addition they were instructed to follow a low sodium diet with regular exercise to help to maintain adequate control of blood pressure.    CKD (chronic kidney disease) stage 4, GFR 15-29 ml/min (HCC) Lab Results  Component  Value Date   CREATININE 1.38 (H) 06/09/2019   BUN 21 06/09/2019   NA 139 06/09/2019   K 4.7 06/09/2019   CL 106 06/09/2019   CO2 25 06/09/2019  Renal function has been stable.     Simple chronic bronchitis (HCC) Stable with anoro daily and albuterol as needed.   BPH (benign prostatic hyperplasia) Continue flomax.    Meds ordered this encounter  Medications  . montelukast (SINGULAIR) 10 MG tablet    Sig: Take 1 tablet (10 mg total) by mouth at bedtime.    Dispense:  90 tablet    Refill:  2  . doxazosin (CARDURA) 4 MG tablet    Sig: Take 1 tablet (  4 mg total) by mouth daily.    Dispense:  90 tablet    Refill:  1  . tamsulosin (FLOMAX) 0.4 MG CAPS capsule    Sig: Take 1 capsule (0.4 mg total) by mouth daily.    Dispense:  90 capsule    Refill:  1  . pantoprazole (PROTONIX) 40 MG tablet    Sig: Take 1 tablet (40 mg total) by mouth 2 (two) times daily.    Dispense:  180 tablet    Refill:  1    Return in about 6 months (around 03/25/2020) for HTN.    This visit occurred during the SARS-CoV-2 public health emergency.  Safety protocols were in place, including screening questions prior to the visit, additional usage of staff PPE, and extensive cleaning of exam room while observing appropriate contact time as indicated for disinfecting solutions.

## 2019-09-24 ENCOUNTER — Encounter: Payer: Self-pay | Admitting: Family Medicine

## 2019-09-27 ENCOUNTER — Encounter: Payer: Self-pay | Admitting: Family Medicine

## 2019-09-27 DIAGNOSIS — N400 Enlarged prostate without lower urinary tract symptoms: Secondary | ICD-10-CM | POA: Insufficient documentation

## 2019-09-27 NOTE — Assessment & Plan Note (Signed)
Blood pressure is at goal at for age and co-morbidities.  I recommend he continue toprol with furosemide.  In addition they were instructed to follow a low sodium diet with regular exercise to help to maintain adequate control of blood pressure.

## 2019-09-27 NOTE — Assessment & Plan Note (Signed)
Lab Results  Component Value Date   CREATININE 1.38 (H) 06/09/2019   BUN 21 06/09/2019   NA 139 06/09/2019   K 4.7 06/09/2019   CL 106 06/09/2019   CO2 25 06/09/2019  Renal function has been stable.

## 2019-09-27 NOTE — Assessment & Plan Note (Signed)
Stable with anoro daily and albuterol as needed.

## 2019-09-27 NOTE — Assessment & Plan Note (Signed)
RRR on exam today.  Denies symptoms related to A. Fib.

## 2019-09-27 NOTE — Assessment & Plan Note (Signed)
Continue flomax  

## 2019-09-30 ENCOUNTER — Other Ambulatory Visit: Payer: Self-pay

## 2019-09-30 ENCOUNTER — Ambulatory Visit (INDEPENDENT_AMBULATORY_CARE_PROVIDER_SITE_OTHER): Payer: Medicare Other | Admitting: Cardiology

## 2019-09-30 ENCOUNTER — Encounter: Payer: Self-pay | Admitting: Cardiology

## 2019-09-30 VITALS — BP 139/67 | HR 66 | Ht 69.5 in | Wt 167.1 lb

## 2019-09-30 DIAGNOSIS — I10 Essential (primary) hypertension: Secondary | ICD-10-CM | POA: Diagnosis not present

## 2019-09-30 DIAGNOSIS — I4819 Other persistent atrial fibrillation: Secondary | ICD-10-CM

## 2019-09-30 DIAGNOSIS — I2510 Atherosclerotic heart disease of native coronary artery without angina pectoris: Secondary | ICD-10-CM

## 2019-09-30 DIAGNOSIS — E7800 Pure hypercholesterolemia, unspecified: Secondary | ICD-10-CM

## 2019-09-30 DIAGNOSIS — I25810 Atherosclerosis of coronary artery bypass graft(s) without angina pectoris: Secondary | ICD-10-CM | POA: Diagnosis not present

## 2019-09-30 MED ORDER — ATORVASTATIN CALCIUM 80 MG PO TABS: ORAL_TABLET | ORAL | 3 refills | Status: AC

## 2019-09-30 MED ORDER — METOPROLOL SUCCINATE ER 50 MG PO TB24: 50.0000 mg | ORAL_TABLET | Freq: Two times a day (BID) | ORAL | 3 refills | Status: AC

## 2019-09-30 NOTE — Patient Instructions (Signed)
Medication Instructions:  STOP ASPIRIN  *If you need a refill on your cardiac medications before your next appointment, please call your pharmacy*   Lab Work: Your physician recommends that you HAVE LAB WORK TODAY If you have labs (blood work) drawn today and your tests are completely normal, you will receive your results only by: Marland Kitchen MyChart Message (if you have MyChart) OR . A paper copy in the mail If you have any lab test that is abnormal or we need to change your treatment, we will call you to review the results.   Testing/Procedures: Your physician has requested that you have an echocardiogram. Echocardiography is a painless test that uses sound waves to create images of your heart. It provides your doctor with information about the size and shape of your heart and how well your heart's chambers and valves are working. This procedure takes approximately one hour. There are no restrictions for this procedure.[place]   You are scheduled for a  Cardioversion on       with Dr.      .  Please arrive at the [facility] (Main Entrance A) at [facility]: [place], [place] [id number] at           am/pm. (1 hour prior to procedure unless lab work is needed; if lab work is needed arrive 1.5 hours ahead)  DIET: Nothing to eat or drink after midnight except a sip of water with medications (see medication instructions below)  Medication Instructions: DO NOT TAKE METOPROLOL THE MORNING OF THE PROCEDURE   Continue your anticoagulant: ELIQUIS   GO TO [place] must have a responsible person to drive you home and stay in the waiting area during your procedure. Failure to do so could result in cancellation.  Bring your insurance cards.  *Special Note: Every effort is made to have your procedure done on time. Occasionally there are emergencies that  occur at the hospital that may cause delays. Please be patient if a delay does occur.    Follow-Up: At [facility], you and your health needs are our priority.  As part of our continuing mission to provide you with exceptional heart care, we have created designated Provider Care Teams.  These Care Teams include your primary Cardiologist (physician) and Advanced Practice Providers (APPs -  Physician Assistants and Nurse Practitioners) who all work together to provide you with the care you need, when you need it.  We recommend signing up for the patient portal called "MyChart".  Sign up information is provided on this After Visit Summary.  MyChart is used to connect with patients for Virtual Visits (Telemedicine).  Patients are able to view lab/test results, encounter notes, upcoming appointments, etc.  Non-urgent messages can be sent to your provider as well.   To learn more about what you can do with MyChart, go to [email or web address].    Your next appointment:   8-12 week(s)  The format for your next appointment:   In Person  Provider:   Kirk Ruths, MD

## 2019-10-01 ENCOUNTER — Telehealth: Payer: Self-pay | Admitting: *Deleted

## 2019-10-01 DIAGNOSIS — I4819 Other persistent atrial fibrillation: Secondary | ICD-10-CM

## 2019-10-01 LAB — COMPREHENSIVE METABOLIC PANEL
AG Ratio: 1.6 (calc) (ref 1.0–2.5)
ALT: 19 U/L (ref 9–46)
AST: 16 U/L (ref 10–35)
Albumin: 4 g/dL (ref 3.6–5.1)
Alkaline phosphatase (APISO): 78 U/L (ref 35–144)
BUN/Creatinine Ratio: 20 (calc) (ref 6–22)
BUN: 35 mg/dL — ABNORMAL HIGH (ref 7–25)
CO2: 26 mmol/L (ref 20–32)
Calcium: 8.9 mg/dL (ref 8.6–10.3)
Chloride: 105 mmol/L (ref 98–110)
Creat: 1.72 mg/dL — ABNORMAL HIGH (ref 0.70–1.11)
Globulin: 2.5 g/dL (calc) (ref 1.9–3.7)
Glucose, Bld: 91 mg/dL (ref 65–99)
Potassium: 5.5 mmol/L — ABNORMAL HIGH (ref 3.5–5.3)
Sodium: 140 mmol/L (ref 135–146)
Total Bilirubin: 0.4 mg/dL (ref 0.2–1.2)
Total Protein: 6.5 g/dL (ref 6.1–8.1)

## 2019-10-01 LAB — CBC
HCT: 39.8 % (ref 38.5–50.0)
Hemoglobin: 13.5 g/dL (ref 13.2–17.1)
MCH: 32.6 pg (ref 27.0–33.0)
MCHC: 33.9 g/dL (ref 32.0–36.0)
MCV: 96.1 fL (ref 80.0–100.0)
MPV: 10.4 fL (ref 7.5–12.5)
Platelets: 245 10*3/uL (ref 140–400)
RBC: 4.14 10*6/uL — ABNORMAL LOW (ref 4.20–5.80)
RDW: 12.4 % (ref 11.0–15.0)
WBC: 8.8 10*3/uL (ref 3.8–10.8)

## 2019-10-01 LAB — LIPID PANEL
Cholesterol: 118 mg/dL (ref <200)
HDL: 35 mg/dL — ABNORMAL LOW (ref >=40)
LDL Cholesterol (Calc): 63 mg/dL (calc)
Non-HDL Cholesterol (Calc): 83 mg/dL (calc) (ref <130)
Total CHOL/HDL Ratio: 3.4 (calc) (ref <5.0)
Triglycerides: 116 mg/dL (ref <150)

## 2019-10-01 LAB — TSH: TSH: 2.37 mIU/L (ref 0.40–4.50)

## 2019-10-01 MED ORDER — APIXABAN 2.5 MG PO TABS: 2.5000 mg | ORAL_TABLET | Freq: Two times a day (BID) | ORAL | 12 refills | Status: AC

## 2019-10-01 NOTE — Telephone Encounter (Addendum)
Spoke with pt, Aware of dr Jacalyn Lefevre recommendations. New script sent to the pharmacy and Lab orders mailed to the pt   ----- Message from Lelon Perla, MD sent at 10/01/2019  7:17 AM EST ----- Low k diet; bmet one week; apixaban 2.5 BID Kirk Ruths

## 2019-10-02 ENCOUNTER — Telehealth: Payer: Self-pay | Admitting: Cardiology

## 2019-10-02 NOTE — Telephone Encounter (Signed)
   Pt c/o medication issue:  1. Name of Medication: apixaban (ELIQUIS) 2.5 MG TABS tablet  2. How are you currently taking this medication (dosage and times per day)?   3. Are you having a reaction (difficulty breathing--STAT)?   4. What is your medication issue? Pt said he had nose bleed this morning, doesn't know if it's related to his sinus but he is wondering if he needs to hold taking his eliquis.  Please call

## 2019-10-02 NOTE — Telephone Encounter (Signed)
Called and spoke to patient. He states that he noticed a little blood when he blew his nose this morning and wanted to know if he should take his Eliquis. Patient with a HX of sinus problems. Patient was started on Eliquis for Afib and is scheduled for DCCV. Instructed patient to take his eliquis and discussed the importance of taking it for stroke prevention. Instructed patient to avoid NSAIDS and continue to monitor for S/Sx of bleeding and let us know if his Sx change or worsen. He verbalized understanding and thanked me for the call.

## 2019-10-05 ENCOUNTER — Ambulatory Visit (HOSPITAL_BASED_OUTPATIENT_CLINIC_OR_DEPARTMENT_OTHER)
Admission: RE | Admit: 2019-10-05 | Discharge: 2019-10-05 | Disposition: A | Discharge status: Home / Self Care | Payer: Medicare Other | Source: Ambulatory Visit | Attending: Cardiology | Admitting: Cardiology

## 2019-10-05 ENCOUNTER — Other Ambulatory Visit: Payer: Self-pay

## 2019-10-05 DIAGNOSIS — I4819 Other persistent atrial fibrillation: Secondary | ICD-10-CM | POA: Diagnosis not present

## 2019-10-05 MED ORDER — PERFLUTREN LIPID MICROSPHERE
1.0000 mL | INTRAVENOUS | Status: AC | PRN
  Administered 2019-10-05: 3 mL via INTRAVENOUS
  Filled 2019-10-05: qty 10

## 2019-10-05 NOTE — Progress Notes (Signed)
  Echocardiogram 2D Echocardiogram has been performed.  Bradley Stanley 10/05/2019, 10:48 AM

## 2019-10-14 DIAGNOSIS — I4819 Other persistent atrial fibrillation: Secondary | ICD-10-CM | POA: Diagnosis not present

## 2019-10-14 LAB — BASIC METABOLIC PANEL
BUN/Creatinine Ratio: 16 (calc) (ref 6–22)
BUN: 25 mg/dL (ref 7–25)
CO2: 29 mmol/L (ref 20–32)
Calcium: 8.5 mg/dL — ABNORMAL LOW (ref 8.6–10.3)
Chloride: 102 mmol/L (ref 98–110)
Creat: 1.54 mg/dL — ABNORMAL HIGH (ref 0.70–1.11)
Glucose, Bld: 97 mg/dL (ref 65–99)
Potassium: 4.6 mmol/L (ref 3.5–5.3)
Sodium: 138 mmol/L (ref 135–146)

## 2019-10-15 ENCOUNTER — Encounter: Payer: Self-pay | Admitting: *Deleted

## 2019-10-15 NOTE — Telephone Encounter (Signed)
This encounter was created in error - please disregard.

## 2019-10-15 NOTE — Telephone Encounter (Signed)
-----   Message from Lelon Perla, MD sent at 10/15/2019  7:22 AM EDT ----- DC ASA; apixaban 2.5 mg BID Kirk Ruths

## 2019-11-05 ENCOUNTER — Telehealth: Payer: Self-pay | Admitting: Cardiology

## 2019-11-05 NOTE — Telephone Encounter (Signed)
Pt's wife calling inquiring as to what time pt need to be at  hospital for cardioversion. Informed wife procedure is scheduled for 11 am so pt will need to arrive an hour early. Wife verbalized understanding.

## 2019-11-05 NOTE — Telephone Encounter (Signed)
New message   Patient's wife wants to get further instruction about his cardioversion. Please call to discuss.

## 2019-11-07 ENCOUNTER — Other Ambulatory Visit (HOSPITAL_COMMUNITY)
Admission: RE | Admit: 2019-11-07 | Discharge: 2019-11-07 | Disposition: A | Discharge status: Home / Self Care | Payer: Medicare Other | Source: Ambulatory Visit | Attending: Cardiovascular Disease | Admitting: Cardiovascular Disease

## 2019-11-07 DIAGNOSIS — Z01812 Encounter for preprocedural laboratory examination: Secondary | ICD-10-CM | POA: Insufficient documentation

## 2019-11-07 DIAGNOSIS — Z20822 Contact with and (suspected) exposure to covid-19: Secondary | ICD-10-CM | POA: Diagnosis not present

## 2019-11-07 LAB — SARS CORONAVIRUS 2 (TAT 6-24 HRS): SARS Coronavirus 2: NEGATIVE

## 2019-11-11 ENCOUNTER — Encounter (HOSPITAL_COMMUNITY)
Admission: RE | Disposition: A | Discharge status: Home / Self Care | Payer: Medicare Other | Source: Home / Self Care | Attending: Cardiovascular Disease

## 2019-11-11 ENCOUNTER — Ambulatory Visit (HOSPITAL_COMMUNITY)
Admission: RE | Admit: 2019-11-11 | Discharge: 2019-11-11 | Disposition: A | Discharge status: Home / Self Care | Payer: Medicare Other | Attending: Cardiovascular Disease | Admitting: Cardiovascular Disease

## 2019-11-11 ENCOUNTER — Ambulatory Visit (HOSPITAL_COMMUNITY): Payer: Medicare Other | Admitting: Anesthesiology

## 2019-11-11 ENCOUNTER — Other Ambulatory Visit: Payer: Self-pay

## 2019-11-11 DIAGNOSIS — K219 Gastro-esophageal reflux disease without esophagitis: Secondary | ICD-10-CM | POA: Diagnosis not present

## 2019-11-11 DIAGNOSIS — N184 Chronic kidney disease, stage 4 (severe): Secondary | ICD-10-CM | POA: Diagnosis not present

## 2019-11-11 DIAGNOSIS — Z8249 Family history of ischemic heart disease and other diseases of the circulatory system: Secondary | ICD-10-CM | POA: Insufficient documentation

## 2019-11-11 DIAGNOSIS — Z7901 Long term (current) use of anticoagulants: Secondary | ICD-10-CM | POA: Diagnosis not present

## 2019-11-11 DIAGNOSIS — I252 Old myocardial infarction: Secondary | ICD-10-CM | POA: Diagnosis not present

## 2019-11-11 DIAGNOSIS — F419 Anxiety disorder, unspecified: Secondary | ICD-10-CM | POA: Insufficient documentation

## 2019-11-11 DIAGNOSIS — E7800 Pure hypercholesterolemia, unspecified: Secondary | ICD-10-CM | POA: Diagnosis not present

## 2019-11-11 DIAGNOSIS — I129 Hypertensive chronic kidney disease with stage 1 through stage 4 chronic kidney disease, or unspecified chronic kidney disease: Secondary | ICD-10-CM | POA: Diagnosis not present

## 2019-11-11 DIAGNOSIS — I4819 Other persistent atrial fibrillation: Secondary | ICD-10-CM | POA: Diagnosis not present

## 2019-11-11 DIAGNOSIS — I255 Ischemic cardiomyopathy: Secondary | ICD-10-CM | POA: Diagnosis not present

## 2019-11-11 DIAGNOSIS — I10 Essential (primary) hypertension: Secondary | ICD-10-CM | POA: Diagnosis not present

## 2019-11-11 DIAGNOSIS — F329 Major depressive disorder, single episode, unspecified: Secondary | ICD-10-CM | POA: Diagnosis not present

## 2019-11-11 DIAGNOSIS — N400 Enlarged prostate without lower urinary tract symptoms: Secondary | ICD-10-CM | POA: Insufficient documentation

## 2019-11-11 DIAGNOSIS — I480 Paroxysmal atrial fibrillation: Secondary | ICD-10-CM | POA: Diagnosis not present

## 2019-11-11 DIAGNOSIS — F17210 Nicotine dependence, cigarettes, uncomplicated: Secondary | ICD-10-CM | POA: Insufficient documentation

## 2019-11-11 DIAGNOSIS — Z951 Presence of aortocoronary bypass graft: Secondary | ICD-10-CM | POA: Diagnosis not present

## 2019-11-11 DIAGNOSIS — I2510 Atherosclerotic heart disease of native coronary artery without angina pectoris: Secondary | ICD-10-CM | POA: Diagnosis not present

## 2019-11-11 DIAGNOSIS — Z79899 Other long term (current) drug therapy: Secondary | ICD-10-CM | POA: Insufficient documentation

## 2019-11-11 HISTORY — PX: CARDIOVERSION: SHX1299

## 2019-11-11 LAB — POCT I-STAT, CHEM 8
BUN: 28 mg/dL — ABNORMAL HIGH (ref 8–23)
Calcium, Ion: 1.11 mmol/L — ABNORMAL LOW (ref 1.15–1.40)
Chloride: 107 mmol/L (ref 98–111)
Creatinine, Ser: 1.5 mg/dL — ABNORMAL HIGH (ref 0.61–1.24)
Glucose, Bld: 114 mg/dL — ABNORMAL HIGH (ref 70–99)
HCT: 38 % — ABNORMAL LOW (ref 39.0–52.0)
Hemoglobin: 12.9 g/dL — ABNORMAL LOW (ref 13.0–17.0)
Potassium: 4.7 mmol/L (ref 3.5–5.1)
Sodium: 140 mmol/L (ref 135–145)
TCO2: 26 mmol/L (ref 22–32)

## 2019-11-11 SURGERY — CARDIOVERSION
Anesthesia: General

## 2019-11-11 MED ORDER — SODIUM CHLORIDE 0.9 % IV SOLN
INTRAVENOUS | Status: AC | PRN
  Administered 2019-11-11: 500 mL via INTRAMUSCULAR

## 2019-11-11 MED ORDER — LIDOCAINE HCL (CARDIAC) PF 100 MG/5ML IV SOSY
PREFILLED_SYRINGE | INTRAVENOUS | Status: DC | PRN
  Administered 2019-11-11: 60 mg via INTRATRACHEAL

## 2019-11-11 MED ORDER — PROPOFOL 10 MG/ML IV BOLUS
INTRAVENOUS | Status: DC | PRN
  Administered 2019-11-11: 50 mg via INTRAVENOUS
  Administered 2019-11-11: 10 mg via INTRAVENOUS

## 2019-11-11 NOTE — Anesthesia Preprocedure Evaluation (Addendum)
Anesthesia Evaluation  Patient identified by MRN, date of birth, ID band Patient awake    Reviewed: Allergy & Precautions, NPO status , Patient's Chart, lab work & pertinent test results, reviewed documented beta blocker date and time   Airway Mallampati: II  TM Distance: >3 FB Neck ROM: Full    Dental  (+) Dental Advisory Given, Upper Dentures   Pulmonary Current Smoker and Patient abstained from smoking.,    Pulmonary exam normal breath sounds clear to auscultation       Cardiovascular hypertension, Pt. on medications and Pt. on home beta blockers + CAD, + Past MI, + CABG, + Peripheral Vascular Disease and +CHF  + dysrhythmias Atrial Fibrillation  Rhythm:Irregular Rate:Abnormal     Neuro/Psych PSYCHIATRIC DISORDERS Anxiety negative neurological ROS     GI/Hepatic Neg liver ROS, PUD, GERD  Medicated,  Endo/Other  negative endocrine ROS  Renal/GU Renal InsufficiencyRenal disease     Musculoskeletal negative musculoskeletal ROS (+)   Abdominal   Peds  Hematology  (+) Blood dyscrasia (Eliquis), ,   Anesthesia Other Findings Day of surgery medications reviewed with the patient.  Reproductive/Obstetrics                            Anesthesia Physical Anesthesia Plan  ASA: III  Anesthesia Plan: General   Post-op Pain Management:    Induction: Intravenous  PONV Risk Score and Plan: 1 and Propofol infusion and Treatment may vary due to age or medical condition  Airway Management Planned: Mask  Additional Equipment:   Intra-op Plan:   Post-operative Plan:   Informed Consent: I have reviewed the patients History and Physical, chart, labs and discussed the procedure including the risks, benefits and alternatives for the proposed anesthesia with the patient or authorized representative who has indicated his/her understanding and acceptance.     Dental advisory given  Plan Discussed  with: CRNA  Anesthesia Plan Comments:         Anesthesia Quick Evaluation

## 2019-11-11 NOTE — H&P (Signed)
Cardiology History and Physical:   Patient ID: Bradley Stanley MRN: [id number]; DOB: 11/18/1932   Admission date: 11/11/2019  Primary Care Provider: Luetta Nutting, DO Primary Cardiologist: Stanford Breed Primary Electrophysiologist:  None   Chief Complaint: Atrial fibrillation  Patient Profile:   Bradley Stanley is a 86 y.o. male with of coronary artery disease with previous bypass surgery, extensive anteroseptal myocardial infarction, moderately depressed left ventricular systolic function with well compensated heart failure, recently diagnosed with symptomatic persistent atrial fibrillation.  He is referred by Dr. Stanford Breed for electrical cardioversion. History of Present Illness:   Mr. Asman was last seen in the cardiology office by Dr. Stanford Breed on March 10.  He reported increased exertional dyspnea, but no symptoms at rest and no edema.  He was aware of palpitations.  Has not had recent angina pectoris.  He was found to have atrial fibrillation, aspirin was stopped and a direct oral anticoagulant was started, with which he has been compliant over the last month.  He has not had any bleeding issues.  He presents today for elective cardioversion.   Past Medical History:  Diagnosis Date  . A-fib ([place]) 04/2015   after CABG.  Abixiban initiated.   . Acute blood loss anemia   . Anxiety    "occasionally" (11/03/2015)  . BPH (benign prostatic hyperplasia)   . CAD (coronary artery disease)   . Chronic bronchitis ([place])   . GERD (gastroesophageal reflux disease)   . Heart murmur   . High cholesterol   . Hypertension   . Myocardial infarction ([place]) 04/2015  . Pneumonia 05/2015   "after heart surgery"  . Pyloric ulcer   . Renal insufficiency     Past Surgical History:  Procedure Laterality Date  . CATARACT EXTRACTION W/ INTRAOCULAR LENS  IMPLANT, BILATERAL    . CORONARY ARTERY BYPASS GRAFT  04/2015   CABG "X4; [place]"  . ESOPHAGOGASTRODUODENOSCOPY N/A 11/06/2015   Procedure:  ESOPHAGOGASTRODUODENOSCOPY (EGD);  Surgeon: Jerene Bears, MD;  Location: [facility] ENDOSCOPY;  Service: Endoscopy;  Laterality: N/A;  . HAND RECONSTRUCTION Bilateral 1970s   "tore up by garage door spring"  . INGUINAL HERNIA REPAIR Bilateral      Medications Prior to Admission: Prior to Admission medications   Medication Sig Start Date End Date Taking? Authorizing Provider  apixaban (ELIQUIS) 2.5 MG TABS tablet Take 1 tablet (2.5 mg total) by mouth 2 (two) times daily. 10/01/19  Yes Lelon Perla, MD  atorvastatin (LIPITOR) 80 MG tablet TAKE 1 TABLET BY MOUTH ONCE DAILY . 09/30/19  Yes Lelon Perla, MD  AZO-CRANBERRY PO Take 1 tablet by mouth daily.   Yes [provider]  Cetirizine HCl 10 MG CAPS Take 10 mg by mouth daily.   Yes [provider]  doxazosin (CARDURA) 4 MG tablet Take 1 tablet (4 mg total) by mouth daily. 09/23/19  Yes Luetta Nutting, DO  furosemide (LASIX) 40 MG tablet Take 1 tablet (40 mg total) by mouth daily. 05/04/19  Yes Gregor Hams, MD  lisinopril (ZESTRIL) 10 MG tablet Take 1 tablet (10 mg total) by mouth daily. 05/04/19  Yes Gregor Hams, MD  LORazepam (ATIVAN) 0.5 MG tablet TAKE 1 TABLET BY MOUTH EVERY 8 HOURS AS NEEDED FOR ANXIETY 03/19/19  Yes Gregor Hams, MD  metoprolol succinate (TOPROL-XL) 50 MG 24 hr tablet Take 1 tablet (50 mg total) by mouth 2 (two) times daily. Take with or immediately following a meal. 09/30/19  Yes Crenshaw, Denice Bors, MD  montelukast (SINGULAIR)  10 MG tablet Take 1 tablet (10 mg total) by mouth at bedtime. 09/23/19  Yes Luetta Nutting, DO  Multiple Vitamin (MULTIVITAMIN) tablet Take 1 tablet by mouth daily.   Yes [provider]  pantoprazole (PROTONIX) 40 MG tablet Take 1 tablet (40 mg total) by mouth 2 (two) times daily. 09/23/19  Yes Luetta Nutting, DO  sucralfate (CARAFATE) 1 g tablet Take 1 tablet by mouth 4 times daily Patient taking differently: Take 1 g by mouth 4 (four) times daily -  with meals and at bedtime.   09/21/19  Yes Luetta Nutting, DO  tamsulosin (FLOMAX) 0.4 MG CAPS capsule Take 1 capsule (0.4 mg total) by mouth daily. 09/23/19  Yes Luetta Nutting, DO  umeclidinium-vilanterol (ANORO ELLIPTA) 62.5-25 MCG/INH AEPB Inhale 1 puff into the lungs daily.   Yes [provider]     Allergies:   No Known Allergies  Social History:   Social History   Socioeconomic History  . Marital status: Married    Spouse name: Not on file  . Number of children: 3  . Years of education: Not on file  . Highest education level: Not on file  Occupational History  . Not on file  Tobacco Use  . Smoking status: Current Some Day Smoker    Packs/day: 0.50    Years: 65.00    Pack years: 32.50    Types: Cigarettes  . Smokeless tobacco: Never Used  Substance and Sexual Activity  . Alcohol use: Yes    Alcohol/week: 0.0 standard drinks    Comment: 11/03/2015 "not drinking anything now"  . Drug use: No  . Sexual activity: Never  Other Topics Concern  . Not on file  Social History Narrative  . Not on file   Social Determinants of Health   Financial Resource Strain:   . Difficulty of Paying Living Expenses:   Food Insecurity:   . Worried About Charity fundraiser in the Last Year:   . Arboriculturist in the Last Year:   Transportation Needs:   . Film/video editor (Medical):   Marland Kitchen Lack of Transportation (Non-Medical):   Physical Activity:   . Days of Exercise per Week:   . Minutes of Exercise per Session:   Stress:   . Feeling of Stress :   Social Connections:   . Frequency of Communication with Friends and Family:   . Frequency of Social Gatherings with Friends and Family:   . Attends Religious Services:   . Active Member of Clubs or Organizations:   . Attends Archivist Meetings:   Marland Kitchen Marital Status:   Intimate Partner Violence:   . Fear of Current or Ex-Partner:   . Emotionally Abused:   Marland Kitchen Physically Abused:   . Sexually Abused:     Family History:   The patient's family  history includes CAD in his father; Diabetes in his brother and sister.    ROS:  Please see the history of present illness.  All other ROS reviewed and negative.     Physical Exam/Data:   Vitals:   11/11/19 1049 11/11/19 1057 11/11/19 1106 11/11/19 1107  BP: (!) 132/54 (!) 124/56  (!) 127/47  Pulse: 72 65 61 (!) 58  Resp: 19 15 12 11   Temp: 98.7 F (37.1 C)     TempSrc: Axillary     SpO2: 99% 96% 97% 97%  Weight:        Intake/Output Summary (Last 24 hours) at 11/11/2019 1135 Last data filed  at 11/11/2019 1047 Gross per 24 hour  Intake 100 ml  Output -  Net 100 ml   Last 3 Weights 11/11/2019 09/30/2019 09/23/2019  Weight (lbs) 167 lb 167 lb 1.9 oz 167 lb  Weight (kg) 75.751 kg 75.805 kg 75.751 kg     Body mass index is 24.31 kg/m.  General:  Well nourished, well developed, in no acute distress HEENT: normal Lymph: no adenopathy Neck: no JVD Endocrine:  No thryomegaly Vascular: No carotid bruits; FA pulses 2+ bilaterally without bruits  Cardiac:  normal S1, S2; irregular rhythm, no gallops;, a 7-8/4 systolic murmur is heard on both sides of the sternum.  Sternotomy scar. Lungs:  clear to auscultation bilaterally, no wheezing, rhonchi or rales  Abd: soft, nontender, no hepatomegaly  Ext: no edema Musculoskeletal:  No deformities, BUE and BLE strength normal and equal Skin: warm and dry  Neuro:  CNs 2-12 intact, no focal abnormalities noted Psych:  Normal affect    EKG: Cardiac telemetry today demonstrates atrial fibrillation  Relevant CV Studies: Echo 10/05/2019 1. Apical cap akinesis and also mid ant and inferior segments, suggesting  LAD territory infarct. Seen on previous echo also.Marland Kitchen Left ventricular  ejection fraction, by estimation, is 35 to 40%. The left ventricle has  moderately decreased function. The left  ventricle demonstrates regional wall motion abnormalities (see scoring  diagram/findings for description). There is moderate left ventricular   hypertrophy. Left ventricular diastolic parameters are consistent with  Grade I diastolic dysfunction (impaired  relaxation).  2. Right ventricular systolic function is normal. The right ventricular  size is normal. There is moderately elevated pulmonary artery systolic  pressure.  3. The mitral valve is normal in structure. No evidence of mitral valve  regurgitation. No evidence of mitral stenosis.  4. The aortic valve is normal in structure. Aortic valve regurgitation is  mild. Mild aortic valve stenosis.  5. The inferior vena cava is normal in size with greater than 50%  respiratory variability, suggesting right atrial pressure of 3 mmHg.  6. Patient appears to be in sinus rhythm!   Laboratory Data:  High Sensitivity Troponin:  No results for input(s): TROPONINIHS in the last 720 hours.    Chemistry Recent Labs  Lab 11/11/19 1033  NA 140  K 4.7  CL 107  GLUCOSE 114*  BUN 28*  CREATININE 1.50*    No results for input(s): PROT, ALBUMIN, AST, ALT, ALKPHOS, BILITOT in the last 168 hours. Hematology Recent Labs  Lab 11/11/19 1033  HGB 12.9*  HCT 38.0*   BNPNo results for input(s): BNP, PROBNP in the last 168 hours.  DDimer No results for input(s): DDIMER in the last 168 hours.   Radiology/Studies:  No results found. {   Assessment and Plan:   Symptomatic atrial fibrillation in a patient with depressed left ventricular systolic function due to ischemic cardiomyopathy.  He has been on oral anticoagulants for more than 4 weeks, without missed doses.  We will go ahead with electrical cardioversion today. This procedure has been fully reviewed with the patient and written informed consent has been obtained.  Higher than average likelihood of atrial fibrillation recurrence after cardioversion.  He may require antiarrhythmics.  He is counseled not to discontinue anticoagulation following the cardioversion.   For questions or updates, please contact [place]  Please consult [email or web address] for contact info under        Signed, Sanda Klein, MD  11/11/2019 11:35 AM

## 2019-11-11 NOTE — Transfer of Care (Signed)
Immediate Anesthesia Transfer of Care Note  Patient: Keevin Panebianco  Procedure(s) Performed: CARDIOVERSION (N/A )  Patient Location: Endoscopy Unit  Anesthesia Type:MAC  Level of Consciousness: awake, alert  and oriented  Airway & Oxygen Therapy: Patient Spontanous Breathing and Patient connected to nasal cannula oxygen  Post-op Assessment: Report given to RN, Post -op Vital signs reviewed and stable and Patient moving all extremities  Post vital signs: Reviewed and stable  Last Vitals:  Vitals Value Taken Time  BP    Temp    Pulse    Resp    SpO2      Last Pain:  Vitals:   11/11/19 1019  TempSrc: Oral  PainSc: 0-No pain         Complications: No apparent anesthesia complications

## 2019-11-11 NOTE — Anesthesia Postprocedure Evaluation (Signed)
Anesthesia Post Note  Patient: Bradley Stanley  Procedure(s) Performed: CARDIOVERSION (N/A )     Patient location during evaluation: Endoscopy Anesthesia Type: General Level of consciousness: awake and alert Pain management: pain level controlled Vital Signs Assessment: post-procedure vital signs reviewed and stable Respiratory status: spontaneous breathing, nonlabored ventilation, respiratory function stable and patient connected to nasal cannula oxygen Cardiovascular status: blood pressure returned to baseline and stable Postop Assessment: no apparent nausea or vomiting Anesthetic complications: no    Last Vitals:  Vitals:   11/11/19 1106 11/11/19 1107  BP:  (!) 127/47  Pulse: 61 (!) 58  Resp: 12 11  Temp:    SpO2: 97% 97%    Last Pain:  Vitals:   11/11/19 1057  TempSrc:   PainSc: 0-No pain                 Catalina Gravel

## 2019-11-11 NOTE — Anesthesia Procedure Notes (Signed)
Procedure Name: General with mask airway Date/Time: 11/11/2019 10:34 AM Performed by: Amadeo Garnet, CRNA Pre-anesthesia Checklist: Patient identified, Emergency Drugs available, Suction available and Patient being monitored Oxygen Delivery Method: Ambu bag Preoxygenation: Pre-oxygenation with 100% oxygen Induction Type: IV induction Placement Confirmation: positive ETCO2 Dental Injury: Teeth and Oropharynx as per pre-operative assessment

## 2019-11-11 NOTE — Op Note (Signed)
Procedure: Electrical Cardioversion Indications:  Atrial Fibrillation  Procedure Details:  Consent: Risks of procedure as well as the alternatives and risks of each were explained to the (patient/caregiver).  Consent for procedure obtained.  Time Out: Verified patient identification, verified procedure, site/side was marked, verified correct patient position, special equipment/implants available, medications/allergies/relevent history reviewed, required imaging and test results available.  Performed  Patient placed on cardiac monitor, pulse oximetry, supplemental oxygen as necessary.  Sedation given: propofol 60 mg IV, Dr. Gifford Shave Pacer pads placed anterior and posterior chest.  Cardioverted 1 time(s).  Cardioversion with synchronized biphasic 120J shock.  Evaluation: Findings: Post procedure EKG shows: NSR Complications: None Patient did tolerate procedure well.  Time Spent Directly with the Patient:  30 minutes   Bradley Stanley 11/11/2019, 10:46 AM

## 2019-11-13 ENCOUNTER — Encounter: Payer: Self-pay | Admitting: *Deleted

## 2019-12-03 NOTE — Progress Notes (Signed)
HPI: FU coronary artery disease. Patient had an anteroseptal myocardial infarction in October 7253 complicated by cardiogenic shock. Cared for in [place]. He underwent emergent coronary artery bypass graft for left main disease and an occluded LAD. He had grafting of his LAD and ramus intermedius. His right coronary artery was occluded with left collaterals and was not grafted. There was a circumflex that rose anomalously from the right coronary cusp and had what was described as modest disease and was not grafted. Echocardiogram May 2017 showed ejection fraction 35-40% with akinesis of the anterior, septal, apical and inferior apical walls. 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 Follow-up recommended in 5 years. Patient did have postoperative atrial fibrillation following his bypass surgery. Event monitor October 2017 showed sinus rhythm with PACs and PVCs. Echocardiogram March 2021 showed ejection fraction 35 to 40%, moderate left ventricular hypertrophy, grade 1 diastolic dysfunction and mild aortic insufficiency.  At last office visit patient was found to be in atrial fibrillation and underwent elective cardioversion on November 11, 2019.  Since last seen,patient has dyspnea with more vigorous activities but not routine activities.  No orthopnea, PND, pedal edema, chest pain or syncope.  No bleeding.  Current Outpatient Medications  Medication Sig Dispense Refill  . apixaban (ELIQUIS) 2.5 MG TABS tablet Take 1 tablet (2.5 mg total) by mouth 2 (two) times daily. 60 tablet 12  . atorvastatin (LIPITOR) 80 MG tablet TAKE 1 TABLET BY MOUTH ONCE DAILY . 90 tablet 3  . AZO-CRANBERRY PO Take 1 tablet by mouth daily.    . Cetirizine HCl 10 MG CAPS Take 10 mg by mouth daily.    Marland Kitchen doxazosin (CARDURA) 4 MG tablet Take 1 tablet (4 mg total) by mouth daily. 90 tablet 1  . furosemide (LASIX) 40 MG tablet Take 1 tablet (40 mg total) by mouth daily. 90 tablet 2  . lisinopril (ZESTRIL)  10 MG tablet Take 1 tablet (10 mg total) by mouth daily. 90 tablet 2  . LORazepam (ATIVAN) 0.5 MG tablet TAKE 1 TABLET BY MOUTH EVERY 8 HOURS AS NEEDED FOR ANXIETY 30 tablet 5  . metoprolol succinate (TOPROL-XL) 50 MG 24 hr tablet Take 1 tablet (50 mg total) by mouth 2 (two) times daily. Take with or immediately following a meal. 180 tablet 3  . montelukast (SINGULAIR) 10 MG tablet Take 1 tablet (10 mg total) by mouth at bedtime. 90 tablet 2  . Multiple Vitamin (MULTIVITAMIN) tablet Take 1 tablet by mouth daily.    . pantoprazole (PROTONIX) 40 MG tablet Take 1 tablet (40 mg total) by mouth 2 (two) times daily. 180 tablet 1  . sucralfate (CARAFATE) 1 g tablet Take 1 tablet by mouth 4 times daily (Patient taking differently: Take 1 g by mouth 4 (four) times daily -  with meals and at bedtime. ) 360 tablet 0  . tamsulosin (FLOMAX) 0.4 MG CAPS capsule Take 1 capsule (0.4 mg total) by mouth daily. 90 capsule 1  . umeclidinium-vilanterol (ANORO ELLIPTA) 62.5-25 MCG/INH AEPB Inhale 1 puff into the lungs daily.     No current facility-administered medications for this visit.     Past Medical History:  Diagnosis Date  . A-fib ([place]) 04/2015   after CABG.  Abixiban initiated.   . Acute blood loss anemia   . Anxiety    "occasionally" (11/03/2015)  . BPH (benign prostatic hyperplasia)   . CAD (coronary artery disease)   . Chronic bronchitis ([place])   . GERD (  gastroesophageal reflux disease)   . Heart murmur   . High cholesterol   . Hypertension   . Myocardial infarction ([place]) 04/2015  . Pneumonia 05/2015   "after heart surgery"  . Pyloric ulcer   . Renal insufficiency     Past Surgical History:  Procedure Laterality Date  . CARDIOVERSION N/A 11/11/2019   Procedure: CARDIOVERSION;  Surgeon: Sanda Klein, MD;  Location: [facility] ENDOSCOPY;  Service: Cardiovascular;  Laterality: N/A;  . CATARACT EXTRACTION W/ INTRAOCULAR LENS  IMPLANT, BILATERAL    . CORONARY ARTERY BYPASS GRAFT  04/2015   CABG  "X4; [place]"  . ESOPHAGOGASTRODUODENOSCOPY N/A 11/06/2015   Procedure: ESOPHAGOGASTRODUODENOSCOPY (EGD);  Surgeon: Jerene Bears, MD;  Location: [facility] ENDOSCOPY;  Service: Endoscopy;  Laterality: N/A;  . HAND RECONSTRUCTION Bilateral 1970s   "tore up by garage door spring"  . INGUINAL HERNIA REPAIR Bilateral     Social History   Socioeconomic History  . Marital status: Married    Spouse name: Not on file  . Number of children: 3  . Years of education: Not on file  . Highest education level: Not on file  Occupational History  . Not on file  Tobacco Use  . Smoking status: Current Some Day Smoker    Packs/day: 0.50    Years: 65.00    Pack years: 32.50    Types: Cigarettes  . Smokeless tobacco: Never Used  Substance and Sexual Activity  . Alcohol use: Yes    Alcohol/week: 0.0 standard drinks    Comment: 11/03/2015 "not drinking anything now"  . Drug use: No  . Sexual activity: Never  Other Topics Concern  . Not on file  Social History Narrative  . Not on file   Social Determinants of Health   Financial Resource Strain:   . Difficulty of Paying Living Expenses:   Food Insecurity:   . Worried About Charity fundraiser in the Last Year:   . Arboriculturist in the Last Year:   Transportation Needs:   . Film/video editor (Medical):   Marland Kitchen Lack of Transportation (Non-Medical):   Physical Activity:   . Days of Exercise per Week:   . Minutes of Exercise per Session:   Stress:   . Feeling of Stress :   Social Connections:   . Frequency of Communication with Friends and Family:   . Frequency of Social Gatherings with Friends and Family:   . Attends Religious Services:   . Active Member of Clubs or Organizations:   . Attends Archivist Meetings:   Marland Kitchen Marital Status:   Intimate Partner Violence:   . Fear of Current or Ex-Partner:   . Emotionally Abused:   Marland Kitchen Physically Abused:   . Sexually Abused:     Family History  Problem Relation Age of Onset  . CAD  Father   . Diabetes Sister   . Diabetes Brother     ROS: no fevers or chills, productive cough, hemoptysis, dysphasia, odynophagia, melena, hematochezia, dysuria, hematuria, rash, seizure activity, orthopnea, PND, pedal edema, claudication. Remaining systems are negative.  Physical Exam: Well-developed well-nourished in no acute distress.  Skin is warm and dry.  HEENT is normal.  Neck is supple.  Chest is clear to auscultation with normal expansion.  Cardiovascular exam irregular Abdominal exam nontender or distended. No masses palpated. Extremities show no edema. neuro grossly intact  ECG-atrial fibrillation at a rate of 94, inferior and anterior infarct.  Personally reviewed  A/P  1 paroxysmal atrial  fibrillation-patient is back in atrial fibrillation today.  However I am not convinced that he is particularly symptomatic.  I discussed rate control versus rhythm control and we have elected rate control for now.  Continue beta-blocker and apixaban.  I will see him back in 3 months and if he remains asymptomatic we will plan long-term rate control and anticoagulation.  If he develops worsening symptoms we may consider adding amiodarone with repeat cardioversion.   2 coronary artery disease-patient has not had chest pain.  Continue statin.  He is not on aspirin given need for anticoagulation for atrial fibrillation.  3 hypertension-blood pressure controlled.  Continue present medications.  4 hyperlipidemia-continue statin.  5 ischemic cardiomyopathy-continue beta-blocker and ACE inhibitor.  6 chronic systolic congestive heart failure-he is euvolemic.  Continue Lasix at present dose.  7 peripheral vascular disease-ectatic aorta noted on previous ultrasound.  Plan follow-up study May 2022.  8 tobacco abuse-needs to discontinue.  Kirk Ruths, MD

## 2019-12-09 ENCOUNTER — Encounter: Payer: Self-pay | Admitting: Cardiology

## 2019-12-09 ENCOUNTER — Other Ambulatory Visit: Payer: Self-pay

## 2019-12-09 ENCOUNTER — Ambulatory Visit (INDEPENDENT_AMBULATORY_CARE_PROVIDER_SITE_OTHER): Payer: Medicare Other | Admitting: Cardiology

## 2019-12-09 VITALS — BP 146/70 | HR 94 | Ht 69.5 in | Wt 163.0 lb

## 2019-12-09 DIAGNOSIS — E7800 Pure hypercholesterolemia, unspecified: Secondary | ICD-10-CM

## 2019-12-09 DIAGNOSIS — I10 Essential (primary) hypertension: Secondary | ICD-10-CM

## 2019-12-09 DIAGNOSIS — I4819 Other persistent atrial fibrillation: Secondary | ICD-10-CM

## 2019-12-09 DIAGNOSIS — I25810 Atherosclerosis of coronary artery bypass graft(s) without angina pectoris: Secondary | ICD-10-CM

## 2019-12-09 DIAGNOSIS — I2510 Atherosclerotic heart disease of native coronary artery without angina pectoris: Secondary | ICD-10-CM

## 2019-12-09 NOTE — Patient Instructions (Signed)
Medication Instructions:  NO CHANGE *If you need a refill on your cardiac medications before your next appointment, please call your pharmacy*   Lab Work: If you have labs (blood work) drawn today and your tests are completely normal, you will receive your results only by: Marland Kitchen MyChart Message (if you have MyChart) OR . A paper copy in the mail If you have any lab test that is abnormal or we need to change your treatment, we will call you to review the results.   Follow-Up: At [facility], you and your health needs are our priority.  As part of our continuing mission to provide you with exceptional heart care, we have created designated Provider Care Teams.  These Care Teams include your primary Cardiologist (physician) and Advanced Practice Providers (APPs -  Physician Assistants and Nurse Practitioners) who all work together to provide you with the care you need, when you need it.  We recommend signing up for the patient portal called "MyChart".  Sign up information is provided on this After Visit Summary.  MyChart is used to connect with patients for Virtual Visits (Telemedicine).  Patients are able to view lab/test results, encounter notes, upcoming appointments, etc.  Non-urgent messages can be sent to your provider as well.   To learn more about what you can do with MyChart, go to [email or web address].    Your next appointment:   3 month(s)  The format for your next appointment:   In Person  Provider:   Kirk Ruths, MD

## 2019-12-14 ENCOUNTER — Other Ambulatory Visit: Payer: Self-pay | Admitting: Family Medicine

## 2019-12-16 DIAGNOSIS — C4402 Squamous cell carcinoma of skin of lip: Secondary | ICD-10-CM | POA: Diagnosis not present

## 2019-12-16 DIAGNOSIS — L570 Actinic keratosis: Secondary | ICD-10-CM | POA: Diagnosis not present

## 2019-12-16 DIAGNOSIS — L578 Other skin changes due to chronic exposure to nonionizing radiation: Secondary | ICD-10-CM | POA: Diagnosis not present

## 2019-12-16 DIAGNOSIS — L821 Other seborrheic keratosis: Secondary | ICD-10-CM | POA: Diagnosis not present

## 2019-12-16 DIAGNOSIS — C44319 Basal cell carcinoma of skin of other parts of face: Secondary | ICD-10-CM | POA: Diagnosis not present

## 2019-12-18 NOTE — Telephone Encounter (Signed)
[place] requesting med refill for anoro inhaler. Written by historical provider.

## 2020-01-19 ENCOUNTER — Other Ambulatory Visit: Payer: Self-pay | Admitting: Family Medicine

## 2020-01-19 ENCOUNTER — Other Ambulatory Visit: Payer: Self-pay

## 2020-01-19 DIAGNOSIS — I25810 Atherosclerosis of coronary artery bypass graft(s) without angina pectoris: Secondary | ICD-10-CM

## 2020-01-19 MED ORDER — LISINOPRIL 10 MG PO TABS: 10.0000 mg | ORAL_TABLET | Freq: Every day | ORAL | 0 refills | Status: DC

## 2020-01-19 MED ORDER — FUROSEMIDE 40 MG PO TABS: 40.0000 mg | ORAL_TABLET | Freq: Every day | ORAL | 0 refills | Status: DC

## 2020-01-19 NOTE — Telephone Encounter (Signed)
Meds pended, previously written by Dr Georgina Snell  Please send if [facility]

## 2020-02-26 NOTE — Progress Notes (Signed)
HPI: FU coronary artery disease. Patient had an anteroseptal myocardial infarction in October 3825 complicated by cardiogenic shock. Cared for in [place]. He underwent emergent coronary artery bypass graft for left main disease and an occluded LAD. He had grafting of his LAD and ramus intermedius. His right coronary artery was occluded with left collaterals and was not grafted. There was a circumflex that rose anomalously from the right coronary cusp and had what was described as modest disease and was not grafted. 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 Follow-up recommended in 5 years. Patient did have postoperative atrial fibrillation following his bypass surgery. Event monitor October 2017 showed sinus rhythm with PACs and PVCs. Echocardiogram March 2021 showed ejection fraction 35 to 40%, moderate left ventricular hypertrophy, grade 1 diastolic dysfunction and mild aortic insufficiency. At previous office visit patient was found to be in atrial fibrillation and underwent elective cardioversion on November 11, 2019.However at time of follow-up he was found to have recurrent atrial fibrillation.  Plan was for rate control.  Since last seen,he has mild dyspnea on exertion but denies orthopnea, PND, pedal edema, exertional chest pain or syncope.  Current Outpatient Medications  Medication Sig Dispense Refill   ANORO ELLIPTA 62.5-25 MCG/INH AEPB Inhale 1 puff by mouth once daily 90 each 0   apixaban (ELIQUIS) 2.5 MG TABS tablet Take 1 tablet (2.5 mg total) by mouth 2 (two) times daily. 60 tablet 12   atorvastatin (LIPITOR) 80 MG tablet TAKE 1 TABLET BY MOUTH ONCE DAILY . 90 tablet 3   AZO-CRANBERRY PO Take 1 tablet by mouth daily.     Cetirizine HCl 10 MG CAPS Take 10 mg by mouth daily.     doxazosin (CARDURA) 4 MG tablet Take 1 tablet (4 mg total) by mouth daily. 90 tablet 1   furosemide (LASIX) 40 MG tablet Take 1 tablet (40 mg total) by mouth daily. Needs appt 30  tablet 0   lisinopril (ZESTRIL) 10 MG tablet Take 1 tablet (10 mg total) by mouth daily. Needs appt 30 tablet 0   LORazepam (ATIVAN) 0.5 MG tablet TAKE 1 TABLET BY MOUTH EVERY 8 HOURS AS NEEDED FOR ANXIETY 30 tablet 5   metoprolol succinate (TOPROL-XL) 50 MG 24 hr tablet Take 1 tablet (50 mg total) by mouth 2 (two) times daily. Take with or immediately following a meal. 180 tablet 3   montelukast (SINGULAIR) 10 MG tablet Take 1 tablet (10 mg total) by mouth at bedtime. 90 tablet 2   Multiple Vitamin (MULTIVITAMIN) tablet Take 1 tablet by mouth daily.     pantoprazole (PROTONIX) 40 MG tablet Take 1 tablet (40 mg total) by mouth 2 (two) times daily. 180 tablet 1   sucralfate (CARAFATE) 1 g tablet Take 1 tablet by mouth 4 times daily (Patient taking differently: Take 1 g by mouth 4 (four) times daily -  with meals and at bedtime. ) 360 tablet 0   tamsulosin (FLOMAX) 0.4 MG CAPS capsule Take 1 capsule (0.4 mg total) by mouth daily. 90 capsule 1   triamcinolone cream (KENALOG) 0.1 % Apply topically 2 (two) times daily as needed.     No current facility-administered medications for this visit.     Past Medical History:  Diagnosis Date   A-fib ([place]) 04/2015   after CABG.  Abixiban initiated.    Acute blood loss anemia    Anxiety    "occasionally" (11/03/2015)   BPH (benign prostatic hyperplasia)    CAD (  coronary artery disease)    Chronic bronchitis (HCC)    GERD (gastroesophageal reflux disease)    Heart murmur    High cholesterol    Hypertension    Myocardial infarction ([place]) 04/2015   Pneumonia 05/2015   "after heart surgery"   Pyloric ulcer    Renal insufficiency     Past Surgical History:  Procedure Laterality Date   CARDIOVERSION N/A 11/11/2019   Procedure: CARDIOVERSION;  Surgeon: Sanda Klein, MD;  Location: [facility];  Service: Cardiovascular;  Laterality: N/A;   CATARACT EXTRACTION W/ INTRAOCULAR LENS  IMPLANT, BILATERAL     CORONARY ARTERY  BYPASS GRAFT  04/2015   CABG "X4; [place]"   ESOPHAGOGASTRODUODENOSCOPY N/A 11/06/2015   Procedure: ESOPHAGOGASTRODUODENOSCOPY (EGD);  Surgeon: Jerene Bears, MD;  Location: [facility] ENDOSCOPY;  Service: Endoscopy;  Laterality: N/A;   HAND RECONSTRUCTION Bilateral 1970s   "tore up by garage door spring"   INGUINAL HERNIA REPAIR Bilateral     Social History   Socioeconomic History   Marital status: Married    Spouse name: Not on file   Number of children: 3   Years of education: Not on file   Highest education level: Not on file  Occupational History   Not on file  Tobacco Use   Smoking status: Current Some Day Smoker    Packs/day: 0.50    Years: 65.00    Pack years: 32.50    Types: Cigarettes   Smokeless tobacco: Never Used  Substance and Sexual Activity   Alcohol use: Yes    Alcohol/week: 0.0 standard drinks    Comment: 11/03/2015 "not drinking anything now"   Drug use: No   Sexual activity: Never  Other Topics Concern   Not on file  Social History Narrative   Not on file   Social Determinants of Health   Financial Resource Strain:    Difficulty of Paying Living Expenses:   Food Insecurity:    Worried About Charity fundraiser in the Last Year:    Arboriculturist in the Last Year:   Transportation Needs:    Film/video editor (Medical):    Lack of Transportation (Non-Medical):   Physical Activity:    Days of Exercise per Week:    Minutes of Exercise per Session:   Stress:    Feeling of Stress :   Social Connections:    Frequency of Communication with Friends and Family:    Frequency of Social Gatherings with Friends and Family:    Attends Religious Services:    Active Member of Clubs or Organizations:    Attends Music therapist:    Marital Status:   Intimate Partner Violence:    Fear of Current or Ex-Partner:    Emotionally Abused:    Physically Abused:    Sexually Abused:     Family History  Problem  Relation Age of Onset   CAD Father    Diabetes Sister    Diabetes Brother     ROS: no fevers or chills, productive cough, hemoptysis, dysphasia, odynophagia, melena, hematochezia, dysuria, hematuria, rash, seizure activity, orthopnea, PND, pedal edema, claudication. Remaining systems are negative.  Physical Exam: Well-developed well-nourished in no acute distress.  Skin is warm and dry.  HEENT is normal.  Neck is supple.  Chest with diminished breath sounds throughout Cardiovascular exam is regular rate and rhythm.  2/6 systolic murmur left sternal border. Abdominal exam nontender or distended. No masses palpated. Extremities show no edema. neuro grossly intact  ECG-sinus rhythm at a rate of 69, septal infarct, anterior T wave inversion.  Personally reviewed  A/P  1 persistent atrial fibrillation-patient is back in sinus rhythm today.  He was mildly symptomatic with atrial fibrillation (mild increased dyspnea on exertion and fatigue).  We will continue strategy of rate control/anticoagulation for now.  Continue beta-blocker and apixaban.   2 coronary artery disease-continue medical therapy including statin.  He is not on aspirin given need for apixaban.  3 hypertension-blood pressure controlled.  Continue present medical regimen and follow.  4 hyperlipidemia-continue statin.  5 chronic systolic congestive heart failure-patient is euvolemic on examination.  Continue Lasix at present dose.  6 ischemic cardiomyopathy-continue ACE inhibitor and beta-blocker.  7 peripheral vascular disease-patient previously noted to have ectatic aorta by ultrasound.  Plan is to repeat study May 2022.  8 tobacco abuse-patient counseled on discontinuing.  Kirk Ruths, MD

## 2020-03-09 ENCOUNTER — Other Ambulatory Visit: Payer: Self-pay

## 2020-03-09 ENCOUNTER — Encounter: Payer: Self-pay | Admitting: Cardiology

## 2020-03-09 ENCOUNTER — Ambulatory Visit (INDEPENDENT_AMBULATORY_CARE_PROVIDER_SITE_OTHER): Payer: Medicare Other | Admitting: Cardiology

## 2020-03-09 VITALS — BP 133/69 | HR 69 | Ht 69.5 in | Wt 163.8 lb

## 2020-03-09 DIAGNOSIS — I25810 Atherosclerosis of coronary artery bypass graft(s) without angina pectoris: Secondary | ICD-10-CM

## 2020-03-09 DIAGNOSIS — I480 Paroxysmal atrial fibrillation: Secondary | ICD-10-CM | POA: Diagnosis not present

## 2020-03-09 DIAGNOSIS — I2510 Atherosclerotic heart disease of native coronary artery without angina pectoris: Secondary | ICD-10-CM

## 2020-03-09 DIAGNOSIS — I10 Essential (primary) hypertension: Secondary | ICD-10-CM | POA: Diagnosis not present

## 2020-03-09 DIAGNOSIS — E7800 Pure hypercholesterolemia, unspecified: Secondary | ICD-10-CM | POA: Diagnosis not present

## 2020-03-09 NOTE — Patient Instructions (Signed)
Medication Instructions:  NO CHANGE *If you need a refill on your cardiac medications before your next appointment, please call your pharmacy*   Lab Work: If you have labs (blood work) drawn today and your tests are completely normal, you will receive your results only by: . MyChart Message (if you have MyChart) OR . A paper copy in the mail If you have any lab test that is abnormal or we need to change your treatment, we will call you to review the results.   Follow-Up: At [facility], you and your health needs are our priority.  As part of our continuing mission to provide you with exceptional heart care, we have created designated Provider Care Teams.  These Care Teams include your primary Cardiologist (physician) and Advanced Practice Providers (APPs -  Physician Assistants and Nurse Practitioners) who all work together to provide you with the care you need, when you need it.  We recommend signing up for the patient portal called "MyChart".  Sign up information is provided on this After Visit Summary.  MyChart is used to connect with patients for Virtual Visits (Telemedicine).  Patients are able to view lab/test results, encounter notes, upcoming appointments, etc.  Non-urgent messages can be sent to your provider as well.   To learn more about what you can do with MyChart, go to [email or web address].    Your next appointment:   6 month(s)  The format for your next appointment:   In Person  Provider:   Brian Crenshaw, MD    

## 2020-03-23 ENCOUNTER — Other Ambulatory Visit: Payer: Self-pay

## 2020-03-23 ENCOUNTER — Ambulatory Visit (INDEPENDENT_AMBULATORY_CARE_PROVIDER_SITE_OTHER): Payer: Medicare Other | Admitting: Family Medicine

## 2020-03-23 ENCOUNTER — Encounter: Payer: Self-pay | Admitting: Family Medicine

## 2020-03-23 VITALS — BP 141/64 | HR 76 | Ht 69.5 in | Wt 161.2 lb

## 2020-03-23 DIAGNOSIS — N184 Chronic kidney disease, stage 4 (severe): Secondary | ICD-10-CM

## 2020-03-23 DIAGNOSIS — J310 Chronic rhinitis: Secondary | ICD-10-CM | POA: Diagnosis not present

## 2020-03-23 DIAGNOSIS — I480 Paroxysmal atrial fibrillation: Secondary | ICD-10-CM

## 2020-03-23 DIAGNOSIS — I10 Essential (primary) hypertension: Secondary | ICD-10-CM

## 2020-03-23 DIAGNOSIS — J410 Simple chronic bronchitis: Secondary | ICD-10-CM | POA: Diagnosis not present

## 2020-03-23 DIAGNOSIS — Z23 Encounter for immunization: Secondary | ICD-10-CM | POA: Diagnosis not present

## 2020-03-23 MED ORDER — IPRATROPIUM BROMIDE 0.03 % NA SOLN: 2.0000 | Freq: Two times a day (BID) | NASAL | 6 refills | Status: AC | PRN

## 2020-03-23 NOTE — Assessment & Plan Note (Signed)
Renal function has been stable.  Will continue to monitor routinely and avoid nephrotoxic medications.

## 2020-03-23 NOTE — Assessment & Plan Note (Signed)
He will continue daily anoro with albuterol as needed.  Smoking cessation recommended.

## 2020-03-23 NOTE — Assessment & Plan Note (Signed)
RRR on exam today.  No symptoms at this time.  Rate controlled with metoprolol and anticoagulated with eliquis He will continue to see cardiology.

## 2020-03-23 NOTE — Progress Notes (Signed)
Bradley Stanley - 86 y.o. male MRN [id number]  Date of birth: 26-Jul-1932  Subjective Chief Complaint  Patient presents with  . Hypertension    HPI Bradley Stanley is a 86 y.o. male here today for follow up visit.  He has history of HTN, PAF, ischemic cardiomyopathy, CKD, BPH, COPD and HLD.  His wife is currently in the hospital with COPD flare.  He is a little stressed from this, he is hopeful she will return home today.    HTN is managed with combination of lisinopril and metoprolol.  His a. Fib has remained in rhythm since previous cardioversion in April.  He remains anticoagulated with eliquis at 2.5mg  BID due to advanced age and CKD.  He has not noticed any symptoms of bleeding.    He continues on anoro daily with albuterol as needed for COPD.  HE denies increased wheezing or shortness of breath.  He does continue to smoke occasionally.    He is having some issues with recurrent rhinitis.  Worse when eating.  He has tried flonase, azelastine, and most recently singulair without improvement.    ROS:  A comprehensive ROS was completed and negative except as noted per HPI  No Known Allergies  Past Medical History:  Diagnosis Date  . A-fib ([place]) 04/2015   after CABG.  Abixiban initiated.   . Acute blood loss anemia   . Anxiety    "occasionally" (11/03/2015)  . BPH (benign prostatic hyperplasia)   . CAD (coronary artery disease)   . Chronic bronchitis ([place])   . GERD (gastroesophageal reflux disease)   . Heart murmur   . High cholesterol   . Hypertension   . Myocardial infarction ([place]) 04/2015  . Pneumonia 05/2015   "after heart surgery"  . Pyloric ulcer   . Renal insufficiency     Past Surgical History:  Procedure Laterality Date  . CARDIOVERSION N/A 11/11/2019   Procedure: CARDIOVERSION;  Surgeon: Sanda Klein, MD;  Location: [facility] ENDOSCOPY;  Service: Cardiovascular;  Laterality: N/A;  . CATARACT EXTRACTION W/ INTRAOCULAR LENS  IMPLANT, BILATERAL    . CORONARY ARTERY BYPASS  GRAFT  04/2015   CABG "X4; [place]"  . ESOPHAGOGASTRODUODENOSCOPY N/A 11/06/2015   Procedure: ESOPHAGOGASTRODUODENOSCOPY (EGD);  Surgeon: Jerene Bears, MD;  Location: [facility] ENDOSCOPY;  Service: Endoscopy;  Laterality: N/A;  . HAND RECONSTRUCTION Bilateral 1970s   "tore up by garage door spring"  . INGUINAL HERNIA REPAIR Bilateral     Social History   Socioeconomic History  . Marital status: Married    Spouse name: Not on file  . Number of children: 3  . Years of education: Not on file  . Highest education level: Not on file  Occupational History  . Not on file  Tobacco Use  . Smoking status: Current Some Day Smoker    Packs/day: 0.50    Years: 65.00    Pack years: 32.50    Types: Cigarettes  . Smokeless tobacco: Never Used  Substance and Sexual Activity  . Alcohol use: Yes    Alcohol/week: 0.0 standard drinks    Comment: 11/03/2015 "not drinking anything now"  . Drug use: No  . Sexual activity: Never  Other Topics Concern  . Not on file  Social History Narrative  . Not on file   Social Determinants of Health   Financial Resource Strain:   . Difficulty of Paying Living Expenses: Not on file  Food Insecurity:   . Worried About Charity fundraiser in the Last Year:  Not on file  . Ran Out of Food in the Last Year: Not on file  Transportation Needs:   . Lack of Transportation (Medical): Not on file  . Lack of Transportation (Non-Medical): Not on file  Physical Activity:   . Days of Exercise per Week: Not on file  . Minutes of Exercise per Session: Not on file  Stress:   . Feeling of Stress : Not on file  Social Connections:   . Frequency of Communication with Friends and Family: Not on file  . Frequency of Social Gatherings with Friends and Family: Not on file  . Attends Religious Services: Not on file  . Active Member of Clubs or Organizations: Not on file  . Attends Archivist Meetings: Not on file  . Marital Status: Not on file    Family History   Problem Relation Age of Onset  . CAD Father   . Diabetes Sister   . Diabetes Brother     Health Maintenance  Topic Date Due  . TETANUS/TDAP  05/22/2027  . INFLUENZA VACCINE  Completed  . COVID-19 Vaccine  Completed  . PNA vac Low Risk Adult  Completed     ----------------------------------------------------------------------------------------------------------------------------------------------------------------------------------------------------------------- Physical Exam BP (!) 141/64 (BP Location: Left Arm, Patient Position: Sitting, Cuff Size: Normal)   Pulse 76   Ht 5' 9.5" (1.765 m)   Wt 161 lb 3.2 oz (73.1 kg)   SpO2 99%   BMI 23.46 kg/m   Physical Exam Constitutional:      Appearance: Normal appearance.  HENT:     Head: Normocephalic and atraumatic.  Eyes:     General: No scleral icterus. Cardiovascular:     Rate and Rhythm: Normal rate and regular rhythm.  Pulmonary:     Effort: Pulmonary effort is normal.     Breath sounds: Normal breath sounds.  Musculoskeletal:     Cervical back: Neck supple.  Skin:    General: Skin is warm and dry.  Neurological:     General: No focal deficit present.     Mental Status: He is alert.     ------------------------------------------------------------------------------------------------------------------------------------------------------------------------------------------------------------------- Assessment and Plan  Paroxysmal atrial fibrillation ([place]) RRR on exam today.  No symptoms at this time.  Rate controlled with metoprolol and anticoagulated with eliquis He will continue to see cardiology.     Essential hypertension BP remains fairly well controlled. Continue lisinopril and metoprolol at current strength.  Recommend low sodium diet and smoking cessation.    Simple chronic bronchitis ([place]) He will continue daily anoro with albuterol as needed.  Smoking cessation recommended.    CKD (chronic  kidney disease) stage 4, GFR 15-29 ml/min (HCC) Renal function has been stable.  Will continue to monitor routinely and avoid nephrotoxic medications.    Chronic rhinitis Rhinitis is more gustatory, will give trial of atrovent nasal spray as he has tried treatments for allergies without improvement.    Meds ordered this encounter  Medications  . ipratropium (ATROVENT) 0.03 % nasal spray    Sig: Place 2 sprays into both nostrils 2 (two) times daily as needed for rhinitis.    Dispense:  30 mL    Refill:  6    Return in about 6 months (around 09/20/2020) for HTN.    This visit occurred during the SARS-CoV-2 public health emergency.  Safety protocols were in place, including screening questions prior to the visit, additional usage of staff PPE, and extensive cleaning of exam room while observing appropriate contact time as indicated for disinfecting  solutions.

## 2020-03-23 NOTE — Assessment & Plan Note (Signed)
Rhinitis is more gustatory, will give trial of atrovent nasal spray as he has tried treatments for allergies without improvement.

## 2020-03-23 NOTE — Assessment & Plan Note (Signed)
BP remains fairly well controlled. Continue lisinopril and metoprolol at current strength.  Recommend low sodium diet and smoking cessation.

## 2020-03-23 NOTE — Patient Instructions (Addendum)
Let's try ipratropium nasal spray, twice per day as needed.  Continue current medications.  See me again in about 6 months.

## 2020-03-25 ENCOUNTER — Ambulatory Visit: Payer: Medicare Other | Admitting: Family Medicine

## 2020-03-29 ENCOUNTER — Other Ambulatory Visit: Payer: Self-pay | Admitting: Family Medicine

## 2020-03-29 DIAGNOSIS — N410 Acute prostatitis: Secondary | ICD-10-CM

## 2020-04-13 DIAGNOSIS — C44329 Squamous cell carcinoma of skin of other parts of face: Secondary | ICD-10-CM | POA: Diagnosis not present

## 2020-04-13 DIAGNOSIS — L570 Actinic keratosis: Secondary | ICD-10-CM | POA: Diagnosis not present

## 2020-05-20 ENCOUNTER — Other Ambulatory Visit: Payer: Self-pay | Admitting: Family Medicine

## 2020-05-20 DIAGNOSIS — I25810 Atherosclerosis of coronary artery bypass graft(s) without angina pectoris: Secondary | ICD-10-CM

## 2020-06-22 ENCOUNTER — Other Ambulatory Visit: Payer: Self-pay | Admitting: Family Medicine

## 2020-06-22 DIAGNOSIS — N410 Acute prostatitis: Secondary | ICD-10-CM

## 2020-07-06 DIAGNOSIS — L821 Other seborrheic keratosis: Secondary | ICD-10-CM | POA: Diagnosis not present

## 2020-07-06 DIAGNOSIS — L570 Actinic keratosis: Secondary | ICD-10-CM | POA: Diagnosis not present

## 2020-07-06 DIAGNOSIS — L820 Inflamed seborrheic keratosis: Secondary | ICD-10-CM | POA: Diagnosis not present

## 2020-07-06 DIAGNOSIS — L578 Other skin changes due to chronic exposure to nonionizing radiation: Secondary | ICD-10-CM | POA: Diagnosis not present

## 2020-08-13 ENCOUNTER — Other Ambulatory Visit: Payer: Self-pay | Admitting: Family Medicine

## 2020-08-13 DIAGNOSIS — I25810 Atherosclerosis of coronary artery bypass graft(s) without angina pectoris: Secondary | ICD-10-CM

## 2020-08-15 ENCOUNTER — Other Ambulatory Visit: Payer: Self-pay

## 2020-08-15 DIAGNOSIS — I25810 Atherosclerosis of coronary artery bypass graft(s) without angina pectoris: Secondary | ICD-10-CM

## 2020-08-15 MED ORDER — LISINOPRIL 10 MG PO TABS: 10.0000 mg | ORAL_TABLET | Freq: Every day | ORAL | 0 refills | Status: DC

## 2020-08-17 ENCOUNTER — Other Ambulatory Visit: Payer: Self-pay | Admitting: Family Medicine

## 2020-08-17 DIAGNOSIS — I25810 Atherosclerosis of coronary artery bypass graft(s) without angina pectoris: Secondary | ICD-10-CM

## 2020-09-20 ENCOUNTER — Other Ambulatory Visit: Payer: Self-pay

## 2020-09-20 ENCOUNTER — Ambulatory Visit (INDEPENDENT_AMBULATORY_CARE_PROVIDER_SITE_OTHER): Payer: Medicare Other | Admitting: Family Medicine

## 2020-09-20 ENCOUNTER — Encounter: Payer: Self-pay | Admitting: Family Medicine

## 2020-09-20 VITALS — BP 133/71 | HR 106 | Wt 161.9 lb

## 2020-09-20 DIAGNOSIS — J410 Simple chronic bronchitis: Secondary | ICD-10-CM

## 2020-09-20 DIAGNOSIS — N184 Chronic kidney disease, stage 4 (severe): Secondary | ICD-10-CM

## 2020-09-20 DIAGNOSIS — I25810 Atherosclerosis of coronary artery bypass graft(s) without angina pectoris: Secondary | ICD-10-CM | POA: Diagnosis not present

## 2020-09-20 DIAGNOSIS — E785 Hyperlipidemia, unspecified: Secondary | ICD-10-CM

## 2020-09-20 DIAGNOSIS — I480 Paroxysmal atrial fibrillation: Secondary | ICD-10-CM

## 2020-09-20 DIAGNOSIS — N410 Acute prostatitis: Secondary | ICD-10-CM | POA: Diagnosis not present

## 2020-09-20 DIAGNOSIS — J310 Chronic rhinitis: Secondary | ICD-10-CM

## 2020-09-20 DIAGNOSIS — N400 Enlarged prostate without lower urinary tract symptoms: Secondary | ICD-10-CM

## 2020-09-20 DIAGNOSIS — I5022 Chronic systolic (congestive) heart failure: Secondary | ICD-10-CM

## 2020-09-20 MED ORDER — LISINOPRIL 10 MG PO TABS: 10.0000 mg | ORAL_TABLET | Freq: Every day | ORAL | 3 refills | Status: AC

## 2020-09-20 MED ORDER — ALBUTEROL SULFATE HFA 108 (90 BASE) MCG/ACT IN AERS: 2.0000 | INHALATION_SPRAY | Freq: Four times a day (QID) | RESPIRATORY_TRACT | 0 refills | Status: AC | PRN

## 2020-09-20 MED ORDER — MONTELUKAST SODIUM 10 MG PO TABS: 10.0000 mg | ORAL_TABLET | Freq: Every day | ORAL | 3 refills | Status: AC

## 2020-09-20 MED ORDER — TAMSULOSIN HCL 0.4 MG PO CAPS: 0.4000 mg | ORAL_CAPSULE | Freq: Every day | ORAL | 3 refills | Status: AC

## 2020-09-20 MED ORDER — DOXAZOSIN MESYLATE 4 MG PO TABS: 4.0000 mg | ORAL_TABLET | Freq: Every day | ORAL | 3 refills | Status: AC

## 2020-09-20 MED ORDER — PANTOPRAZOLE SODIUM 40 MG PO TBEC: 40.0000 mg | DELAYED_RELEASE_TABLET | Freq: Two times a day (BID) | ORAL | 3 refills | Status: AC

## 2020-09-20 MED ORDER — FUROSEMIDE 40 MG PO TABS: 40.0000 mg | ORAL_TABLET | Freq: Every day | ORAL | 3 refills | Status: AC

## 2020-09-20 NOTE — Assessment & Plan Note (Signed)
RRR on exam today.  He'll continue to see cardiology for management of medications.  Rate remains controlled with metoprolol and he is doing well with eliquis for anticoagulation   Update labs today.

## 2020-09-20 NOTE — Patient Instructions (Signed)
Great to see you! Have labs completed See me again in about 6 months.  

## 2020-09-20 NOTE — Assessment & Plan Note (Signed)
He has tried several medications for this.  Does not want to make any additional changes at this time.

## 2020-09-20 NOTE — Progress Notes (Signed)
Bradley Stanley - 87 y.o. male MRN [id number]  Date of birth: November 13, 1932  Subjective Chief Complaint  Patient presents with  . Hypertension    HPI Bradley Stanley is a 87 y.o. male with history of HTN, PAF, PVD, COPD, and CKD here today for follow up.  He is also followed by cardiology.   HTN currently managed with combination of lisinopril, metoprolol, and furosemide.  Heart rate has remained well controlled with metoprolol.  Denies palpitations or anginal symptoms.  He is taking eliquis for anticoagulation related to a. Fib.    He has been prescribed anoro for COPD but reports that this didn't work very well and was expensive for him.  Currently using primatene mist.  He has had good relief with albuterol previously as well.    Continues to have problems with rhinitis.  Has tried several nasal sprays and is taking singulair but symptoms still persist.    BPH symptoms remain well controlled with flomax and cardura.    ROS:  A comprehensive ROS was completed and negative except as noted per HPI  No Known Allergies  Past Medical History:  Diagnosis Date  . A-fib (Bradley Stanley) 04/2015   after CABG.  Abixiban initiated.   . Acute blood loss anemia   . Anxiety    "occasionally" (11/03/2015)  . BPH (benign prostatic hyperplasia)   . CAD (coronary artery disease)   . Chronic bronchitis ([place])   . GERD (gastroesophageal reflux disease)   . Heart murmur   . High cholesterol   . Hypertension   . Myocardial infarction (Bradley Stanley) 04/2015  . Pneumonia 05/2015   "after heart surgery"  . Pyloric ulcer   . Renal insufficiency     Past Surgical History:  Procedure Laterality Date  . CARDIOVERSION N/A 11/11/2019   Procedure: CARDIOVERSION;  Surgeon: Sanda Klein, MD;  Location: [facility] ENDOSCOPY;  Service: Cardiovascular;  Laterality: N/A;  . CATARACT EXTRACTION W/ INTRAOCULAR LENS  IMPLANT, BILATERAL    . CORONARY ARTERY BYPASS GRAFT  04/2015   CABG "X4; [place]"  . ESOPHAGOGASTRODUODENOSCOPY N/A  11/06/2015   Procedure: ESOPHAGOGASTRODUODENOSCOPY (EGD);  Surgeon: Jerene Bears, MD;  Location: [facility] ENDOSCOPY;  Service: Endoscopy;  Laterality: N/A;  . HAND RECONSTRUCTION Bilateral 1970s   "tore up by garage door spring"  . INGUINAL HERNIA REPAIR Bilateral     Social History   Socioeconomic History  . Marital status: Married    Spouse name: Not on file  . Number of children: 3  . Years of education: Not on file  . Highest education level: Not on file  Occupational History  . Not on file  Tobacco Use  . Smoking status: Current Some Day Smoker    Packs/day: 0.50    Years: 65.00    Pack years: 32.50    Types: Cigarettes  . Smokeless tobacco: Never Used  Substance and Sexual Activity  . Alcohol use: Yes    Alcohol/week: 0.0 standard drinks    Comment: 11/03/2015 "not drinking anything now"  . Drug use: No  . Sexual activity: Never  Other Topics Concern  . Not on file  Social History Narrative  . Not on file   Social Determinants of Health   Financial Resource Strain: Not on file  Food Insecurity: Not on file  Transportation Needs: Not on file  Physical Activity: Not on file  Stress: Not on file  Social Connections: Not on file    Family History  Problem Relation Age of Onset  . CAD Father   .  Diabetes Sister   . Diabetes Brother     Health Maintenance  Topic Date Due  . COVID-19 Vaccine (3 - Booster for Pfizer series) 02/19/2020  . TETANUS/TDAP  05/22/2027  . INFLUENZA VACCINE  Completed  . PNA vac Low Risk Adult  Completed  . HPV VACCINES  Aged Out     ----------------------------------------------------------------------------------------------------------------------------------------------------------------------------------------------------------------- Physical Exam BP 133/71 (BP Location: Left Arm, Patient Position: Sitting, Cuff Size: Normal)   Pulse (!) 106   Wt 161 lb 14.4 oz (73.4 kg)   SpO2 97%   BMI 23.57 kg/m   Physical  Exam Constitutional:      Appearance: Normal appearance.  Eyes:     General: No scleral icterus. Cardiovascular:     Rate and Rhythm: Normal rate and regular rhythm.  Pulmonary:     Effort: Pulmonary effort is normal.     Breath sounds: Normal breath sounds.  Musculoskeletal:     Cervical back: Neck supple.  Neurological:     General: No focal deficit present.     Mental Status: He is alert.  Psychiatric:        Mood and Affect: Mood normal.        Behavior: Behavior normal.     ------------------------------------------------------------------------------------------------------------------------------------------------------------------------------------------------------------------- Assessment and Plan  Paroxysmal atrial fibrillation (HCC) RRR on exam today.  He'll continue to see cardiology for management of medications.  Rate remains controlled with metoprolol and he is doing well with eliquis for anticoagulation   Update labs today.   Chronic systolic congestive heart failure (HCC) Appears euvolemic on exam.  Continue lasix.    Chronic rhinitis He has tried several medications for this.  Does not want to make any additional changes at this time.   BPH (benign prostatic hyperplasia) Stable with cardura and tamsulosin, continue.   CKD (chronic kidney disease) stage 4, GFR 15-29 ml/min (HCC) Update renal function.   Dyslipidemia Tolerating atorvastatin.  Update lipid panel.   Simple chronic bronchitis (HCC) D/c primatene Refilled albuterol as needed.    Meds ordered this encounter  Medications  . albuterol (VENTOLIN HFA) 108 (90 Base) MCG/ACT inhaler    Sig: Inhale 2 puffs into the lungs every 6 (six) hours as needed for wheezing or shortness of breath.    Dispense:  8 g    Refill:  0  . tamsulosin (FLOMAX) 0.4 MG CAPS capsule    Sig: Take 1 capsule (0.4 mg total) by mouth daily.    Dispense:  90 capsule    Refill:  3  . montelukast (SINGULAIR) 10 MG  tablet    Sig: Take 1 tablet (10 mg total) by mouth at bedtime.    Dispense:  90 tablet    Refill:  3  . pantoprazole (PROTONIX) 40 MG tablet    Sig: Take 1 tablet (40 mg total) by mouth 2 (two) times daily.    Dispense:  180 tablet    Refill:  3  . doxazosin (CARDURA) 4 MG tablet    Sig: Take 1 tablet (4 mg total) by mouth daily.    Dispense:  90 tablet    Refill:  3  . lisinopril (ZESTRIL) 10 MG tablet    Sig: Take 1 tablet (10 mg total) by mouth daily.    Dispense:  90 tablet    Refill:  3  . furosemide (LASIX) 40 MG tablet    Sig: Take 1 tablet (40 mg total) by mouth daily.    Dispense:  90 tablet    Refill:  3  Return in about 6 months (around 03/23/2021) for HTN/HLD/BPH.    This visit occurred during the SARS-CoV-2 public health emergency.  Safety protocols were in place, including screening questions prior to the visit, additional usage of staff PPE, and extensive cleaning of exam room while observing appropriate contact time as indicated for disinfecting solutions.

## 2020-09-20 NOTE — Assessment & Plan Note (Signed)
Update renal function.  

## 2020-09-20 NOTE — Assessment & Plan Note (Signed)
Stable with cardura and tamsulosin, continue.

## 2020-09-20 NOTE — Assessment & Plan Note (Signed)
Appears euvolemic on exam.  Continue lasix.

## 2020-09-20 NOTE — Assessment & Plan Note (Signed)
D/c primatene Refilled albuterol as needed.

## 2020-09-20 NOTE — Assessment & Plan Note (Signed)
Tolerating atorvastatin.  Update lipid panel.

## 2020-10-04 ENCOUNTER — Other Ambulatory Visit: Payer: Self-pay | Admitting: Cardiology

## 2020-10-21 DEATH — deceased

## 2021-03-23 ENCOUNTER — Ambulatory Visit: Payer: Medicare Other | Admitting: Family Medicine
